# Patient Record
Sex: Male | Born: 1960 | Race: Black or African American | Hispanic: No | Smoking: Former smoker
Health system: Southern US, Community
[De-identification: ages and names within clinical notes are randomized; demographics above are authoritative.]

## PROBLEM LIST (undated history)

## (undated) DIAGNOSIS — M7541 Impingement syndrome of right shoulder: Secondary | ICD-10-CM

## (undated) DIAGNOSIS — J189 Pneumonia, unspecified organism: Secondary | ICD-10-CM

## (undated) DIAGNOSIS — M67929 Unspecified disorder of synovium and tendon, unspecified upper arm: Secondary | ICD-10-CM

## (undated) DIAGNOSIS — J302 Other seasonal allergic rhinitis: Secondary | ICD-10-CM

## (undated) DIAGNOSIS — J42 Unspecified chronic bronchitis: Secondary | ICD-10-CM

## (undated) DIAGNOSIS — G4730 Sleep apnea, unspecified: Secondary | ICD-10-CM

## (undated) DIAGNOSIS — M75101 Unspecified rotator cuff tear or rupture of right shoulder, not specified as traumatic: Secondary | ICD-10-CM

## (undated) DIAGNOSIS — K219 Gastro-esophageal reflux disease without esophagitis: Secondary | ICD-10-CM

## (undated) DIAGNOSIS — M1990 Unspecified osteoarthritis, unspecified site: Secondary | ICD-10-CM

## (undated) DIAGNOSIS — M19019 Primary osteoarthritis, unspecified shoulder: Secondary | ICD-10-CM

## (undated) DIAGNOSIS — F329 Major depressive disorder, single episode, unspecified: Secondary | ICD-10-CM

## (undated) DIAGNOSIS — M069 Rheumatoid arthritis, unspecified: Secondary | ICD-10-CM

## (undated) DIAGNOSIS — F419 Anxiety disorder, unspecified: Secondary | ICD-10-CM

## (undated) DIAGNOSIS — IMO0002 Reserved for concepts with insufficient information to code with codable children: Secondary | ICD-10-CM

## (undated) DIAGNOSIS — F32A Depression, unspecified: Secondary | ICD-10-CM

## (undated) DIAGNOSIS — E785 Hyperlipidemia, unspecified: Secondary | ICD-10-CM

## (undated) DIAGNOSIS — I10 Essential (primary) hypertension: Secondary | ICD-10-CM

## (undated) DIAGNOSIS — I509 Heart failure, unspecified: Secondary | ICD-10-CM

## (undated) HISTORY — DX: Rheumatoid arthritis, unspecified: M06.9

## (undated) HISTORY — PX: CATARACT EXTRACTION: SUR2

## (undated) HISTORY — PX: TONSILLECTOMY: SUR1361

## (undated) HISTORY — PX: EXCISIONAL HEMORRHOIDECTOMY: SHX1541

## (undated) HISTORY — DX: Heart failure, unspecified: I50.9

## (undated) HISTORY — PX: INCISION AND DRAINAGE OF WOUND: SHX1803

---

## 1996-09-24 HISTORY — PX: BUNIONECTOMY: SHX129

## 2006-09-24 HISTORY — PX: SHOULDER ARTHROSCOPY: SHX128

## 2010-09-24 DIAGNOSIS — J189 Pneumonia, unspecified organism: Secondary | ICD-10-CM

## 2010-09-24 HISTORY — DX: Pneumonia, unspecified organism: J18.9

## 2011-08-25 HISTORY — PX: ANTERIOR CERVICAL DECOMP/DISCECTOMY FUSION: SHX1161

## 2011-09-25 HISTORY — PX: SHOULDER ARTHROSCOPY W/ ROTATOR CUFF REPAIR: SHX2400

## 2012-03-11 ENCOUNTER — Encounter (HOSPITAL_BASED_OUTPATIENT_CLINIC_OR_DEPARTMENT_OTHER): Payer: Self-pay | Admitting: *Deleted

## 2012-03-11 NOTE — Progress Notes (Signed)
Called pcp for ekg-notes Bring all meds,overnight bag in case he needs to stay Will need istat

## 2012-03-14 ENCOUNTER — Encounter (HOSPITAL_BASED_OUTPATIENT_CLINIC_OR_DEPARTMENT_OTHER)
Admission: RE | Disposition: A | Discharge status: Home / Self Care | Payer: Self-pay | Source: Ambulatory Visit | Attending: Orthopedic Surgery

## 2012-03-14 ENCOUNTER — Ambulatory Visit (HOSPITAL_BASED_OUTPATIENT_CLINIC_OR_DEPARTMENT_OTHER): Payer: BC Managed Care – PPO | Admitting: Anesthesiology

## 2012-03-14 ENCOUNTER — Encounter (HOSPITAL_BASED_OUTPATIENT_CLINIC_OR_DEPARTMENT_OTHER): Payer: Self-pay | Admitting: Anesthesiology

## 2012-03-14 ENCOUNTER — Ambulatory Visit (HOSPITAL_BASED_OUTPATIENT_CLINIC_OR_DEPARTMENT_OTHER)
Admission: RE | Admit: 2012-03-14 | Discharge: 2012-03-14 | Disposition: A | Discharge status: Home / Self Care | Payer: BC Managed Care – PPO | Source: Ambulatory Visit | Attending: Orthopedic Surgery | Admitting: Orthopedic Surgery

## 2012-03-14 ENCOUNTER — Encounter (HOSPITAL_BASED_OUTPATIENT_CLINIC_OR_DEPARTMENT_OTHER): Payer: Self-pay | Admitting: *Deleted

## 2012-03-14 DIAGNOSIS — K219 Gastro-esophageal reflux disease without esophagitis: Secondary | ICD-10-CM | POA: Insufficient documentation

## 2012-03-14 DIAGNOSIS — M7541 Impingement syndrome of right shoulder: Secondary | ICD-10-CM | POA: Diagnosis present

## 2012-03-14 DIAGNOSIS — M75101 Unspecified rotator cuff tear or rupture of right shoulder, not specified as traumatic: Secondary | ICD-10-CM | POA: Diagnosis present

## 2012-03-14 DIAGNOSIS — M19019 Primary osteoarthritis, unspecified shoulder: Secondary | ICD-10-CM | POA: Diagnosis present

## 2012-03-14 DIAGNOSIS — M75120 Complete rotator cuff tear or rupture of unspecified shoulder, not specified as traumatic: Secondary | ICD-10-CM | POA: Insufficient documentation

## 2012-03-14 DIAGNOSIS — IMO0002 Reserved for concepts with insufficient information to code with codable children: Secondary | ICD-10-CM | POA: Insufficient documentation

## 2012-03-14 DIAGNOSIS — M24119 Other articular cartilage disorders, unspecified shoulder: Secondary | ICD-10-CM | POA: Insufficient documentation

## 2012-03-14 DIAGNOSIS — Z5333 Arthroscopic surgical procedure converted to open procedure: Secondary | ICD-10-CM | POA: Insufficient documentation

## 2012-03-14 DIAGNOSIS — I10 Essential (primary) hypertension: Secondary | ICD-10-CM | POA: Insufficient documentation

## 2012-03-14 DIAGNOSIS — M67929 Unspecified disorder of synovium and tendon, unspecified upper arm: Secondary | ICD-10-CM | POA: Diagnosis present

## 2012-03-14 DIAGNOSIS — M25819 Other specified joint disorders, unspecified shoulder: Secondary | ICD-10-CM | POA: Insufficient documentation

## 2012-03-14 DIAGNOSIS — E785 Hyperlipidemia, unspecified: Secondary | ICD-10-CM | POA: Insufficient documentation

## 2012-03-14 HISTORY — DX: Unspecified osteoarthritis, unspecified site: M19.90

## 2012-03-14 HISTORY — DX: Unspecified disorder of synovium and tendon, unspecified upper arm: M67.929

## 2012-03-14 HISTORY — DX: Primary osteoarthritis, unspecified shoulder: M19.019

## 2012-03-14 HISTORY — DX: Impingement syndrome of right shoulder: M75.41

## 2012-03-14 HISTORY — DX: Unspecified rotator cuff tear or rupture of right shoulder, not specified as traumatic: M75.101

## 2012-03-14 HISTORY — DX: Gastro-esophageal reflux disease without esophagitis: K21.9

## 2012-03-14 HISTORY — DX: Essential (primary) hypertension: I10

## 2012-03-14 HISTORY — DX: Reserved for concepts with insufficient information to code with codable children: IMO0002

## 2012-03-14 HISTORY — DX: Other seasonal allergic rhinitis: J30.2

## 2012-03-14 HISTORY — DX: Hyperlipidemia, unspecified: E78.5

## 2012-03-14 LAB — POCT I-STAT, CHEM 8
Chloride: 102 mEq/L (ref 96–112)
Glucose, Bld: 99 mg/dL (ref 70–99)
HCT: 43 % (ref 39.0–52.0)
Hemoglobin: 14.6 g/dL (ref 13.0–17.0)
Potassium: 3.7 mEq/L (ref 3.5–5.1)

## 2012-03-14 SURGERY — SHOULDER ARTHROSCOPY WITH BICEPS TENDON REPAIR
Anesthesia: General | Site: Shoulder | Laterality: Right | Wound class: Clean

## 2012-03-14 MED ORDER — FENTANYL CITRATE 0.05 MG/ML IJ SOLN
50.0000 ug | INTRAMUSCULAR | Status: DC | PRN
  Administered 2012-03-14: 100 ug via INTRAVENOUS

## 2012-03-14 MED ORDER — DEXAMETHASONE SODIUM PHOSPHATE 4 MG/ML IJ SOLN
INTRAMUSCULAR | Status: DC | PRN
  Administered 2012-03-14: 10 mg via INTRAVENOUS

## 2012-03-14 MED ORDER — PROMETHAZINE HCL 25 MG PO TABS: 25.0000 mg | ORAL_TABLET | Freq: Four times a day (QID) | ORAL | Status: DC | PRN

## 2012-03-14 MED ORDER — LIDOCAINE HCL (CARDIAC) 20 MG/ML IV SOLN
INTRAVENOUS | Status: DC | PRN
  Administered 2012-03-14: 50 mg via INTRAVENOUS

## 2012-03-14 MED ORDER — SODIUM CHLORIDE 0.9 % IR SOLN
Status: DC | PRN
  Administered 2012-03-14: 6

## 2012-03-14 MED ORDER — PROPOFOL 10 MG/ML IV EMUL
INTRAVENOUS | Status: DC | PRN
  Administered 2012-03-14: 200 mg via INTRAVENOUS
  Administered 2012-03-14: 100 mg via INTRAVENOUS

## 2012-03-14 MED ORDER — MIDAZOLAM HCL 2 MG/2ML IJ SOLN
0.5000 mg | INTRAMUSCULAR | Status: DC | PRN
  Administered 2012-03-14: 2 mg via INTRAVENOUS

## 2012-03-14 MED ORDER — BUPIVACAINE-EPINEPHRINE PF 0.5-1:200000 % IJ SOLN
INTRAMUSCULAR | Status: DC | PRN
  Administered 2012-03-14: 30 mL

## 2012-03-14 MED ORDER — PHENYLEPHRINE HCL 10 MG/ML IJ SOLN
10.0000 mg | INTRAVENOUS | Status: DC | PRN
  Administered 2012-03-14: 50 ug/min via INTRAVENOUS

## 2012-03-14 MED ORDER — SUCCINYLCHOLINE CHLORIDE 20 MG/ML IJ SOLN
INTRAMUSCULAR | Status: DC | PRN
  Administered 2012-03-14: 100 mg via INTRAVENOUS

## 2012-03-14 MED ORDER — METHOCARBAMOL 500 MG PO TABS: 500.0000 mg | ORAL_TABLET | Freq: Four times a day (QID) | ORAL | Status: AC

## 2012-03-14 MED ORDER — FENTANYL CITRATE 0.05 MG/ML IJ SOLN
INTRAMUSCULAR | Status: DC | PRN
  Administered 2012-03-14 (×4): 25 ug via INTRAVENOUS

## 2012-03-14 MED ORDER — OXYCODONE-ACETAMINOPHEN 10-325 MG PO TABS: 1.0000 | ORAL_TABLET | Freq: Four times a day (QID) | ORAL | Status: AC | PRN

## 2012-03-14 MED ORDER — LACTATED RINGERS IV SOLN
INTRAVENOUS | Status: DC
  Administered 2012-03-14: 07:00:00 via INTRAVENOUS

## 2012-03-14 MED ORDER — CEFAZOLIN SODIUM-DEXTROSE 2-3 GM-% IV SOLR
2.0000 g | INTRAVENOUS | Status: AC
  Administered 2012-03-14: 2 g via INTRAVENOUS

## 2012-03-14 MED ORDER — ONDANSETRON HCL 4 MG/2ML IJ SOLN
INTRAMUSCULAR | Status: DC | PRN
  Administered 2012-03-14: 4 mg via INTRAVENOUS

## 2012-03-14 MED ORDER — PHENYLEPHRINE HCL 10 MG/ML IJ SOLN
INTRAMUSCULAR | Status: DC | PRN
  Administered 2012-03-14: 20 ug via INTRAVENOUS

## 2012-03-14 SURGICAL SUPPLY — 70 items
ANCHOR SUT BIO SW 4.75 W/FIB (Anchor) ×4 IMPLANT
ANCHOR SUT SWIVELLOK BIO (Anchor) ×2 IMPLANT
BLADE 4.2CUDA (BLADE) IMPLANT
BLADE CUDA GRT WHITE 3.5 (BLADE) IMPLANT
BLADE CUTTER GATOR 3.5 (BLADE) ×2 IMPLANT
BLADE GREAT WHITE 4.2 (BLADE) IMPLANT
BLADE SURG 15 STRL LF DISP TIS (BLADE) ×1 IMPLANT
BLADE SURG 15 STRL SS (BLADE) ×1
BUR OVAL 4.0 (BURR) IMPLANT
BUR OVAL 6.0 (BURR) ×2 IMPLANT
CANISTER OMNI JUG 16 LITER (MISCELLANEOUS) ×2 IMPLANT
CANNULA 5.75X71 LONG (CANNULA) ×2 IMPLANT
CANNULA TWIST IN 8.25X7CM (CANNULA) ×4 IMPLANT
CANNULA TWIST IN 8.25X9CM (CANNULA) IMPLANT
CLOTH BEACON ORANGE TIMEOUT ST (SAFETY) ×2 IMPLANT
CUTTER MENISCUS  4.2MM (BLADE) ×2
CUTTER MENISCUS 4.2MM (BLADE) ×2 IMPLANT
DECANTER SPIKE VIAL GLASS SM (MISCELLANEOUS) IMPLANT
DRAPE INCISE IOBAN 66X45 STRL (DRAPES) ×2 IMPLANT
DRAPE SHOULDER BEACH CHAIR (DRAPES) ×2 IMPLANT
DRAPE U 20/CS (DRAPES) ×2 IMPLANT
DRAPE U-SHAPE 47X51 STRL (DRAPES) ×2 IMPLANT
DRSG PAD ABDOMINAL 8X10 ST (GAUZE/BANDAGES/DRESSINGS) ×2 IMPLANT
DURAPREP 26ML APPLICATOR (WOUND CARE) ×2 IMPLANT
ELECT REM PT RETURN 9FT ADLT (ELECTROSURGICAL) ×2
ELECTRODE REM PT RTRN 9FT ADLT (ELECTROSURGICAL) ×1 IMPLANT
FIBERSTICK 2 (SUTURE) IMPLANT
GLOVE BIO SURGEON STRL SZ8 (GLOVE) ×2 IMPLANT
GLOVE BIOGEL M STRL SZ7.5 (GLOVE) ×2 IMPLANT
GOWN PREVENTION PLUS XLARGE (GOWN DISPOSABLE) ×2 IMPLANT
GOWN PREVENTION PLUS XXLARGE (GOWN DISPOSABLE) ×4 IMPLANT
IMMOBILIZER SHOULDER XLGE (ORTHOPEDIC SUPPLIES) IMPLANT
IV NS IRRIG 3000ML ARTHROMATIC (IV SOLUTION) IMPLANT
KIT BIO-TENODESIS 3X8 DISP (MISCELLANEOUS) ×1
KIT INSRT BABSR STRL DISP BTN (MISCELLANEOUS) ×1 IMPLANT
KIT SHOULDER TRACTION (DRAPES) ×2 IMPLANT
LASSO SUT 90 DEGREE (SUTURE) IMPLANT
NDL SUT 6 .5 CRC .975X.05 MAYO (NEEDLE) IMPLANT
NEEDLE MAYO TAPER (NEEDLE)
PACK ARTHROSCOPY DSU (CUSTOM PROCEDURE TRAY) ×2 IMPLANT
PACK BASIN DAY SURGERY FS (CUSTOM PROCEDURE TRAY) ×2 IMPLANT
SET ARTHROSCOPY TUBING (MISCELLANEOUS) ×1
SET ARTHROSCOPY TUBING LN (MISCELLANEOUS) ×1 IMPLANT
SHEET MEDIUM DRAPE 40X70 STRL (DRAPES) ×2 IMPLANT
SLEEVE SCD COMPRESS KNEE MED (MISCELLANEOUS) ×2 IMPLANT
SLING ARM FOAM STRAP LRG (SOFTGOODS) IMPLANT
SLING ARM FOAM STRAP MED (SOFTGOODS) IMPLANT
SLING ARM FOAM STRAP XLG (SOFTGOODS) ×2 IMPLANT
SLING ARM IMMOBILIZER LRG (SOFTGOODS) IMPLANT
SLING ARM IMMOBILIZER MED (SOFTGOODS) IMPLANT
SPONGE GAUZE 4X4 12PLY (GAUZE/BANDAGES/DRESSINGS) ×2 IMPLANT
STRIP CLOSURE SKIN 1/2X4 (GAUZE/BANDAGES/DRESSINGS) ×2 IMPLANT
SUT FIBERWIRE #2 38 T-5 BLUE (SUTURE)
SUT LASSO 45 DEGREE (SUTURE) IMPLANT
SUT LASSO 45 DEGREE LEFT (SUTURE) IMPLANT
SUT LASSO 45D RIGHT (SUTURE) IMPLANT
SUT MNCRL AB 4-0 PS2 18 (SUTURE) ×2 IMPLANT
SUT PDS AB 1 CT  36 (SUTURE)
SUT PDS AB 1 CT 36 (SUTURE) IMPLANT
SUT PROLENE 0 CT 1 CR/8 (SUTURE) IMPLANT
SUT TIGER TAPE 7 IN WHITE (SUTURE) ×2 IMPLANT
SUT VIC AB 3-0 SH 18 (SUTURE) ×2 IMPLANT
SUT VIC AB 3-0 SH 27 (SUTURE)
SUT VIC AB 3-0 SH 27X BRD (SUTURE) IMPLANT
SUTURE FIBERWR #2 38 T-5 BLUE (SUTURE) IMPLANT
TOWEL OR 17X24 6PK STRL BLUE (TOWEL DISPOSABLE) ×2 IMPLANT
TOWEL OR NON WOVEN STRL DISP B (DISPOSABLE) ×2 IMPLANT
TUBE CONNECTING 20X1/4 (TUBING) ×2 IMPLANT
WAND STAR VAC 90 (SURGICAL WAND) ×2 IMPLANT
WATER STERILE IRR 1000ML POUR (IV SOLUTION) ×2 IMPLANT

## 2012-03-14 NOTE — Op Note (Signed)
03/14/2012  10:35 AM  PATIENT:  Dylan Dudley    PRE-OPERATIVE DIAGNOSIS:  Right shoulder massive full-thickness rotator cuff tear, impingement syndrome, labral tear, a.c. joint degenerative arthritis  POST-OPERATIVE DIAGNOSIS:  Same  PROCEDURE:  Right shoulder arthroscopy with massive rotator cuff repair, acromioplasty, distal clavicle resection, extensive debridement, with open suprapectoral biceps tenodesis.  SURGEON:  Eulas Post, MD  PHYSICIAN ASSISTANT: Janace Litten, OPA-C, present and scrubbed throughout the case, critical for completion in a timely fashion, and for retraction, instrumentation, and closure.  ANESTHESIA:   General  PREOPERATIVE INDICATIONS:  [name] is a  51 y.o. male who had severe shoulder pain and failed conservative treatment. He had the above-named diagnoses based on MRI. He elected for surgical management.  The risks benefits and alternatives were discussed with the patient preoperatively including but not limited to the risks of infection, bleeding, nerve injury, cardiopulmonary complications, the need for revision surgery, among others, and the patient was willing to proceed. We also discussed the risks of recurrent tear, Popeye deformity, stiffness, progression of rotator cuff arthropathy, among others and is willing to proceed.  OPERATIVE IMPLANTS: Arthrex swivel lock size 4.75x2 for the supraspinatus, and upper portion of the infraspinatus with a 6.25 mm swivel lock for the biceps tendon  OPERATIVE FINDINGS: The supraspinatus had extensive tear, with split between the 2 layers of the supraspinatus, superiorly and inferiorly. The infraspinatus is also torn off the bone. The subscapularis was intact. The glenohumeral articular cartilage was intact. The labrum was somewhat frayed, and the biceps tendon had greater than 50% tearing with hourglass type deformity. The tissue quality of the supraspinatus was mediocre. The tissue however was  reasonably mobile. The bone quality was excellent. He had substantial subacromial spurring as well as degenerative disease at the a.c. joint.  OPERATIVE PROCEDURE: The patient is brought to the operating room placed in the supine position. General anesthesia was administered. IV antibiotics were given. A regional block targeting given. The right upper time he was examined and basically had full motion. The extremity was prepped and draped in usual sterile fashion. Time out was performed. Diagnostic arthroscopy was carried out with the above-named findings. I used the arthroscopic biter to release the biceps tendon, and then used the shaver to debride back the tendon edge and the stump. I also shaved the labrum, both anteriorly posteriorly and superiorly.  Once I completed the articular portion of the procedure went to the subacromial space. I performed a complete bursectomy and debridement of the CA ligament, releasing it off of the undersurface of the acromion. The hypertrophic spurring undersurface of the acromion. This was removed with a bur.  I also debrided the tendon back to healthy tendon edges, and then prepared the tuberosity with a bur. I used a bird beak suture passer with a fiber tape an inverted horizontal mattress type configuration from posteriorly for the infraspinatus and posterior portion of the supraspinatus. I then used the scorpion suture passer with the second inverted fiber tape for the anterior portion of supraspinatus. Excellent purchase of the tissue was achieved.  I then placed the cannula superiorly, and then inserted the posterior anchor first, advancing the tendon up over the top of head, and then placed a second anchor anterior to that. Excellent purchase was achieved. There was no dogear configuration. The tendon leg down very smoothly and was well apposed to bone. I brought this out basically to the lateral margin of tuberosity.  I then removed the sutures cut the  fiber  tape, and then performed a distal clavicle resection removing approximately 1.2 cm of distal clavicle. I confirmed this from multiple portals.  I then made small incision over the proximal biceps, and went to the deltopectoral interval, and then delivered the biceps tendon through the wound.  I then sutured the tendon with a #2 FiberWire, and placed a drill hole in the center of the bicipital groove, and then delivered the tendon with the anchor down into the humerus and secured it with the swivel lock. Excellent fixation and soft tissue tension was achieved restoring the contour of the biceps.  The wounds are irrigated copiously and closed with Vicryl followed by Monocryl with Steri-Strips and sterile gauze. He was awakened and returned to the PACU in stable and satisfactory condition. There no complications.

## 2012-03-14 NOTE — Progress Notes (Signed)
Assisted Dr. Fitzgerald with right, ultrasound guided, interscalene  block. Side rails up, monitors on throughout procedure. See vital signs in flow sheet. Tolerated Procedure well. 

## 2012-03-14 NOTE — Discharge Instructions (Signed)
Post Anesthesia Home Care Instructions  Activity: Get plenty of rest for the remainder of the day. A responsible adult should stay with you for 24 hours following the procedure.  For the next 24 hours, DO NOT: -Drive a car -Advertising copywriter -Drink alcoholic beverages -Take any medication unless instructed by your physician -Make any legal decisions or sign important papers.  Meals: Start with liquid foods such as gelatin or soup. Progress to regular foods as tolerated. Avoid greasy, spicy, heavy foods. If nausea and/or vomiting occur, drink only clear liquids until the nausea and/or vomiting subsides. Call your physician if vomiting continues.  Special Instructions/Symptoms: Your throat may feel dry or sore from the anesthesia or the breathing tube placed in your throat during surgery. If this causes discomfort, gargle with warm salt water. The discomfort should disappear within 24 hours.        Regional Anesthesia Blocks  1. Numbness or the inability to move the "blocked" extremity may last from 3-48 hours after placement. The length of time depends on the medication injected and your individual response to the medication. If the numbness is not going away after 48 hours, call your surgeon.  2. The extremity that is blocked will need to be protected until the numbness is gone and the  Strength has returned. Because you cannot feel it, you will need to take extra care to avoid injury. Because it may be weak, you may have difficulty moving it or using it. You may not know what position it is in without looking at it while the block is in effect.  3. For blocks in the legs and feet, returning to weight bearing and walking needs to be done carefully. You will need to wait until the numbness is entirely gone and the strength has returned. You should be able to move your leg and foot normally before you try and bear weight or walk. You will need someone to be with you when you first try  to ensure you do not fall and possibly risk injury.  4. Bruising and tenderness at the needle site are common side effects and will resolve in a few days.  5. Persistent numbness or new problems with movement should be communicated to the surgeon or the [facility] [phone] [facility] [phone]).     Rotator Cuff Injury The rotator cuff is the collective set of muscles and tendons that make up the stabilizing unit of your shoulder. This unit holds in the ball of the humerus (upper arm bone) in the socket of the scapula (shoulder blade). Injuries to this stabilizing unit most commonly come from sports or activities that cause the arm to be moved repeatedly over the head. Examples of this include throwing, weight lifting, swimming, racquet sports, or an injury such as falling on your arm. Chronic (longstanding) irritation of this unit can cause inflammation (soreness), bursitis, and eventual damage to the tendons to the point of rupture (tear). An acute (sudden) injury of the rotator cuff can result in a partial or complete tear. You may need surgery with complete tears. Small or partial rotator cuff tears may be treated conservatively with temporary immobilization, exercises and rest. Physical therapy may be needed. HOME CARE INSTRUCTIONS   Apply ice to the injury for 15 to 20 minutes 3 to 4 times per day for the first 2 days. Put the ice in a plastic bag and place a towel between the bag of ice and your skin.  If you have a shoulder immobilizer (sling and straps), do not remove it for as long as directed by your caregiver or until you see a caregiver for a follow-up examination. If you need to remove it, move your arm as little as possible.   You may want to sleep on several pillows or in a recliner at night to lessen swelling and pain.   Only take over-the-counter or prescription medicines for pain, discomfort, or fever as directed by your caregiver.   Do  simple hand squeezing exercises with a soft rubber ball to decrease hand swelling.  SEEK MEDICAL CARE IF:   Pain in your shoulder increases or new pain or numbness develops in your arm, hand, or fingers.   Your hand or fingers are colder than your other hand.  SEEK IMMEDIATE MEDICAL CARE IF:   Your arm, hand, or fingers are numb or tingling.   Your arm, hand, or fingers are increasingly swollen and painful, or turn white or blue.  Document Released: 09/07/2000 Document Revised: 08/30/2011 Document Reviewed: 08/31/2008 [facility] Patient Information 2012 [place], [place].

## 2012-03-14 NOTE — Anesthesia Procedure Notes (Addendum)
Anesthesia Regional Block:  Interscalene brachial plexus block  Pre-Anesthetic Checklist: ,, timeout performed, Correct Patient, Correct Site, Correct Laterality, Correct Procedure, Correct Position, site marked, Risks and benefits discussed, pre-op evaluation,  At surgeon's request and post-op pain management  Laterality: Right  Prep: Maximum Sterile Barrier Precautions used and chloraprep       Needles:  Injection technique: Single-shot  Needle Type: Echogenic Stimulator Needle      Needle Gauge: 22 and 22 G    Additional Needles:  Procedures: ultrasound guided and nerve stimulator Interscalene brachial plexus block  Nerve Stimulator or Paresthesia:  Response: Biceps response, 0.4 mA,   Additional Responses:   Narrative:  Start time: 03/14/2012 6:56 AM End time: 03/14/2012 7:09 AM Injection made incrementally with aspirations every 5 mL. Anesthesiologist: Sampson Goon, MD  Additional Notes: 2% Lidocaine skin wheel.   Interscalene brachial plexus block Procedure Name: Intubation Performed by: York Grice Pre-anesthesia Checklist: Patient identified, Timeout performed, Emergency Drugs available, Suction available and Patient being monitored Patient Re-evaluated:Patient Re-evaluated prior to inductionOxygen Delivery Method: Circle system utilized Preoxygenation: Pre-oxygenation with 100% oxygen Intubation Type: IV induction Ventilation: Mask ventilation without difficulty Laryngoscope Size: Miller and 2 Grade View: Grade II Tube type: Oral Tube size: 8.0 mm Number of attempts: 1 Airway Equipment and Method: Stylet Placement Confirmation: ETT inserted through vocal cords under direct vision,  breath sounds checked- equal and bilateral and positive ETCO2 Secured at: 24 cm Tube secured with: Tape Dental Injury: Teeth and Oropharynx as per pre-operative assessment

## 2012-03-14 NOTE — Anesthesia Preprocedure Evaluation (Signed)
Anesthesia Evaluation  Patient identified by MRN, date of birth, ID band Patient awake    Reviewed: Allergy & Precautions, H&P , NPO status , Patient's Chart, lab work & pertinent test results  Airway Mallampati: I TM Distance: >3 FB Neck ROM: Full    Dental No notable dental hx. (+) Teeth Intact and Dental Advisory Given   Pulmonary neg pulmonary ROS,  breath sounds clear to auscultation  Pulmonary exam normal       Cardiovascular hypertension, On Medications Rhythm:Regular Rate:Normal     Neuro/Psych negative neurological ROS  negative psych ROS   GI/Hepatic Neg liver ROS, GERD-  Medicated and Controlled,  Endo/Other  negative endocrine ROS  Renal/GU negative Renal ROS  negative genitourinary   Musculoskeletal   Abdominal   Peds  Hematology negative hematology ROS (+)   Anesthesia Other Findings   Reproductive/Obstetrics negative OB ROS                           Anesthesia Physical Anesthesia Plan  ASA: II  Anesthesia Plan: General   Post-op Pain Management:    Induction: Intravenous  Airway Management Planned: Oral ETT  Additional Equipment:   Intra-op Plan:   Post-operative Plan: Extubation in OR  Informed Consent: I have reviewed the patients History and Physical, chart, labs and discussed the procedure including the risks, benefits and alternatives for the proposed anesthesia with the patient or authorized representative who has indicated his/her understanding and acceptance.   Dental advisory given  Plan Discussed with: CRNA  Anesthesia Plan Comments:         Anesthesia Quick Evaluation

## 2012-03-14 NOTE — H&P (Signed)
PREOPERATIVE H&P  Chief Complaint: right shoulder impingment syndrome, shoulder, impingment syndrome- shoulder complete rupture of rotator cuff, bicipital tenosynovitis   HPI: Dylan Dudley is a 51 y.o. male who presents for preoperative history and physical with a diagnosis of right shoulder impingment syndrome, shoulder, impingment syndrome- shoulder complete rupture of rotator cuff, bicipital tenosynovitis . Symptoms are rated as moderate to severe, and have been worsening.  This is significantly impairing activities of daily living.  He has elected for surgical management.   Past Medical History  Diagnosis Date  . Hypertension   . Hyperlipemia   . Seasonal allergies   . GERD (gastroesophageal reflux disease)   . Arthritis   . DDD (degenerative disc disease)    Past Surgical History  Procedure Date  . Cervical fusion 08/2011    [place]  . Shoulder arthroscopy 2008    left  . Bunionectomy 1998    both feet  . Tonsillectomy    History   Social History  . Marital Status: Married    Spouse Name: N/A    Number of Children: N/A  . Years of Education: N/A   Social History Main Topics  . Smoking status: Current Everyday Smoker -- 0.5 packs/day  . Smokeless tobacco: None  . Alcohol Use: Yes     2 beers daily  . Drug Use: No  . Sexually Active:    Other Topics Concern  . None   Social History Narrative  . None   History reviewed. No pertinent family history. No Known Allergies Prior to Admission medications   Medication Sig Start Date End Date Taking? Authorizing Provider  amLODipine-benazepril (LOTREL) 5-10 MG per capsule Take 1 capsule by mouth daily.   Yes Historical Provider, MD  aspirin 81 MG tablet Take 81 mg by mouth daily.   Yes Historical Provider, MD  atorvastatin (LIPITOR) 40 MG tablet Take 40 mg by mouth daily.   Yes Historical Provider, MD  hydrochlorothiazide (HYDRODIURIL) 25 MG tablet Take 25 mg by mouth daily. Takes 1/2   Yes Historical  Provider, MD  HYDROcodone-acetaminophen (NORCO) 10-325 MG per tablet Take 1 tablet by mouth every 6 (six) hours as needed.   Yes Historical Provider, MD  loratadine (CLARITIN) 10 MG tablet Take 10 mg by mouth as needed.   Yes Historical Provider, MD  omeprazole (PRILOSEC) 20 MG capsule Take 20 mg by mouth daily.   Yes Historical Provider, MD     Positive ROS: All other systems have been reviewed and were otherwise negative with the exception of those mentioned in the HPI and as above.  Physical Exam: General: Alert, no acute distress Cardiovascular: No pedal edema Respiratory: No cyanosis, no use of accessory musculature GI: No organomegaly, abdomen is soft and non-tender Skin: No lesions in the area of chief complaint Neurologic: Sensation intact distally Psychiatric: Patient is competent for consent with normal mood and affect Lymphatic: No axillary or cervical lymphadenopathy  MUSCULOSKELETAL: Severe pain with abduction and overhead motion of the right shoulder, positive pain over the a.c. joint and over the biceps.  Assessment: right shoulder impingment syndrome, shoulder, impingment syndrome- shoulder complete rupture of rotator cuff, bicipital tenosynovitis   Plan: Plan for Procedure(s): SHOULDER ARTHROSCOPY WITH ROTATOR CUFF REPAIR, ACROMIOPLASTY, DISTAL CLAVICLE RESECTION, BICEPS TENODESIS.  The risks benefits and alternatives were discussed with the patient including but not limited to the risks of nonoperative treatment, versus surgical intervention including infection, bleeding, nerve injury,  blood clots, cardiopulmonary complications, morbidity, mortality, among others, and they were  willing to proceed. We've also discussed the risk of recurrent rotator cuff tear, ongoing pain, stiffness, loss of function, among others.  Eulas Post, MD 03/14/2012 7:28 AM

## 2012-03-14 NOTE — Anesthesia Postprocedure Evaluation (Signed)
  Anesthesia Post-op Note  Patient: Dylan Dudley A Prajapati  Procedure(s) Performed: Procedure(s) (LRB): SHOULDER ARTHROSCOPY WITH BICEPS TENDON REPAIR (Right)  Patient Location: PACU  Anesthesia Type: GA combined with regional for post-op pain  Level of Consciousness: awake  Airway and Oxygen Therapy: Patient Spontanous Breathing and Patient connected to face mask oxygen  Post-op Pain: none  Post-op Assessment: Post-op Vital signs reviewed, Patient's Cardiovascular Status Stable, Respiratory Function Stable, Patent Airway and No signs of Nausea or vomiting  Post-op Vital Signs: Reviewed and stable  Complications: No apparent anesthesia complications

## 2012-03-14 NOTE — Transfer of Care (Signed)
Immediate Anesthesia Transfer of Care Note  Patient: Dylan Dudley  Procedure(s) Performed: Procedure(s) (LRB): SHOULDER ARTHROSCOPY WITH BICEPS TENDON REPAIR (Right)  Patient Location: PACU  Anesthesia Type: General  Level of Consciousness: awake and alert   Airway & Oxygen Therapy: Patient Spontanous Breathing and Patient connected to face mask oxygen  Post-op Assessment: Report given to PACU RN and Post -op Vital signs reviewed and stable  Post vital signs: Reviewed and stable  Complications: No apparent anesthesia complications

## 2012-03-20 ENCOUNTER — Encounter (HOSPITAL_BASED_OUTPATIENT_CLINIC_OR_DEPARTMENT_OTHER): Payer: Self-pay

## 2012-09-24 HISTORY — PX: NECK SURGERY: SHX720

## 2012-12-23 HISTORY — PX: DEBRIDEMENT AND CLOSURE WOUND: SHX5614

## 2013-07-14 NOTE — H&P (Signed)
Subjective:   Patient ID: Dylan Dudley is a 52 y.o. male.  HPI  Pt has hx ACDF 2012 and posterior approach to remove bone spur. Posterior incision complicated by opening, wound healing problems with exposure spinous processes. Underwent excisional debridement and closure with approximation trapezius over bones. Cultures of bone with 1+ Staph, completed few weeks of Vancomycin. Had healed as of 03/2013 visit but suffered recurrent open wound following this. Area opened and packed last visit, cultures with S Aureus. Completed Bactrim course off of this for over 2 weeks. No fevers or chills. PCP obtained CT scan since last visit that he brings with him today.  Review of Systems  Constitutional: Negative for fever, chills, diaphoresis, appetite change and fatigue.  Respiratory: Negative.  Cardiovascular: Negative.  Skin: Positive for wound.  Past Medical History   Diagnosis  Date   .  Hypertension    .  Hypercholesterolemia    .  Anxiety    .  Depression     Past Surgical History   Procedure  Laterality  Date   .  Rotator cuff repair       both arms   .  Neck surgery   2012   .  Foot surgery   1998   .  C-spine fusion anterior approach   2012   .  Head / neck debridement  N/A  01/06/2013     Procedure: EXCISION OF NECK & SPINE WOUND; Surgeon: Gaston Islam, MD; Location[facility] OUTPAT[facility]; Service: Plastics; Laterality: N/A; CONTACT PRECAUTIONS  LATEX PRECAUTIONS  EXCISION OF NECK, SPINE WOUND, POSS FLAP, POSS. INTEGRA, POSS STSG, POSS. VAC/120 MIN PER DR MOLNAR   .  Flap closure back wound  N/A  01/06/2013     Procedure: FLAP CLOSURE BACK WOUND; Surgeon: Gaston Islam, MD; Location[facility]; Service: Plastics; Laterality: N/A;   .  Vac placement  N/A  01/06/2013     Procedure: VAC PLACEMENT; Surgeon: Gaston Islam, MD; Location: [facility] OUTPATIENT OR; Service: Plastics; Laterality: N/A;    History    Social History   .  Marital Status:  Unknown     Spouse  Name:  N/A     Number of Children:  N/A   .  Years of Education:  N/A    Occupational History   .  Not on file.    Social History Main Topics   .  Smoking status:  Current Every Day Smoker -- 0.30 packs/day for 35 years     Types:  Cigarettes   .  Smokeless tobacco:  Never Used   .  Alcohol Use:  72.0 oz/week     6 Cans of beer (12 oz./can) per week      Comment: social drinker   .  Drug Use:  No   .  Sexual Activity:  Not on file    Other Topics  Concern   .  Not on file    Social History Narrative   .  No narrative on file    Current Outpatient Prescriptions on File Prior to Visit   Medication  Sig  Dispense  Refill   .  amLODIPine-benazepril (LOTREL) 5-10 mg per capsule  Take 1 capsule by mouth daily.     Marland Kitchen  aspirin 81 MG EC tablet  Take 81 mg by mouth daily.     Marland Kitchen  atorvastatin (LIPITOR) 20 MG tablet  Take 20 mg by mouth daily.     .  cholecalciferol (  VITAMIN D3) 1000 UNIT Tab  Take 2,000 Units by mouth daily.     .  citalopram (CELEXA) 40 MG tablet  Take 40 mg by mouth nightly.     .  diphenhydrAMINE (BENADRYL) 25 mg capsule  Take 25 mg by mouth every 6 (six) hours as needed.     .  hydrochlorothiazide (HYDRODIURIL) 12.5 MG tablet  Take 12.5 mg by mouth daily.     Marland Kitchen  HYDROmorphone (DILAUDID) 4 MG tablet  Take 1 tablet (4 mg total) by mouth every 4 (four) hours as needed.  50 tablet  0   .  ibuprofen (ADVIL,MOTRIN) 800 MG tablet  Take 800 mg by mouth every 6 (six) hours as needed for Pain.     Marland Kitchen  loratadine (CLARITIN) 10 mg tablet  Take 10 mg by mouth daily.     .  naproxen (NAPROSYN) 500 MG tablet  Take 500 mg by mouth 2 (two) times daily as needed.     Marland Kitchen  omeprazole (PRILOSEC) 20 MG capsule  Take 20 mg by mouth daily.     .  propranolol (INDERAL) 20 MG tablet  Take 20 mg by mouth 2 (two) times daily as needed.     .  traZODone (DESYREL) 50 MG tablet  Take 50 mg by mouth nightly.      No current facility-administered medications on file prior to visit.    Objective:    Physical Exam  Constitutional: He is oriented to person, place, and time.  Neck:  Posterior neck with attenuated skin of midline scar, opening of appr 5 mm that tracks to bone with depth 2 cm, able to express blood fat and thick yellow fluid, no cellulitis  Cardiovascular: Normal rate and regular rhythm.  Pulmonary/Chest: Effort normal and breath sounds normal.  Neurological: He is alert and oriented to person, place, and time.  Assessment:   Recurrent wound, infection and bone exposure over back, spine  Plan:   Personally reviewed outside CT scan. Fluid collection noted over C7 spinous process appr 2 cm diameter, trapezius muscle not approximated in this area.  Clinically without systemic signs infection. Plan excisional debridement inc bone. May require ID evaluation if bone cultures following debridement are positive. Discussed given hx chronic wound exposure and prior surgeries, paraspinal muscles likely not usable, will readvance trapezius. Pending pain, OR findings, will plan outpatient surgery, closure over drain. Rec he establish with his local ID provider that managed his antibiotics earlier this year.

## 2013-07-15 ENCOUNTER — Other Ambulatory Visit (HOSPITAL_COMMUNITY): Payer: Self-pay | Admitting: *Deleted

## 2013-07-15 ENCOUNTER — Encounter (HOSPITAL_COMMUNITY): Payer: Self-pay | Admitting: Pharmacy Technician

## 2013-07-15 NOTE — Pre-Procedure Instructions (Addendum)
Tiwan Schnitker Worrel  07/15/2013   Your procedure is scheduled on:  Friday, July 17, 2013 at 7:30 AM.   Report to [facility] Entrance "A" at 5:30 AM.   Call this number if you have problems the morning of surgery: [phone]   Remember:   Do not eat food or drink liquids after midnight tonight, 07/16/13.   Take these medicines the morning of surgery with A SIP OF WATER: amLODipine (NORVASC), buPROPion (WELLBUTRIN SR), citalopram (CELEXA), gabapentin (NEURONTIN), omeprazole (PRILOSEC), propranolol (INDERAL)                 Do not wear jewelry.  Do not wear lotions, powders, or cologne. You may wear deodorant.  Do not shave 48 hours prior to surgery. Men may shave face and neck.  Do not bring valuables to the hospital.  [facility] is not responsible                  for any belongings or valuables.               Contacts, dentures or bridgework may not be worn into surgery.  Leave suitcase in the car. After surgery it may be brought to your room.  For patients admitted to the hospital, discharge time is determined by your                treatment team.               Patients discharged the day of surgery will not be allowed to drive  home.  Name and phone number of your driver: Friend/Family   Special Instructions: Shower using CHG 2 nights before surgery and the night before surgery.  If you shower the day of surgery use CHG.  Use special wash - you have one bottle of CHG for all showers.  You should use approximately 1/3 of the bottle for each shower.   Please read over the following fact sheets that you were given: Pain Booklet, Coughing and Deep Breathing and Surgical Site Infection Prevention

## 2013-07-16 ENCOUNTER — Encounter (HOSPITAL_COMMUNITY): Payer: Self-pay

## 2013-07-16 ENCOUNTER — Encounter (HOSPITAL_COMMUNITY)
Admission: RE | Admit: 2013-07-16 | Discharge: 2013-07-16 | Disposition: A | Discharge status: Home / Self Care | Payer: BC Managed Care – PPO | Source: Ambulatory Visit | Attending: Plastic Surgery | Admitting: Plastic Surgery

## 2013-07-16 HISTORY — DX: Pneumonia, unspecified organism: J18.9

## 2013-07-16 HISTORY — DX: Depression, unspecified: F32.A

## 2013-07-16 HISTORY — DX: Major depressive disorder, single episode, unspecified: F32.9

## 2013-07-16 HISTORY — DX: Sleep apnea, unspecified: G47.30

## 2013-07-16 LAB — CBC WITH DIFFERENTIAL/PLATELET
Basophils Absolute: 0 10*3/uL (ref 0.0–0.1)
Basophils Relative: 0 % (ref 0–1)
Eosinophils Relative: 3 % (ref 0–5)
HCT: 39.7 % (ref 39.0–52.0)
Hemoglobin: 14 g/dL (ref 13.0–17.0)
Lymphs Abs: 1.1 10*3/uL (ref 0.7–4.0)
MCH: 31.4 pg (ref 26.0–34.0)
MCHC: 35.3 g/dL (ref 30.0–36.0)
MCV: 89 fL (ref 78.0–100.0)
Monocytes Absolute: 0.4 10*3/uL (ref 0.1–1.0)
Monocytes Relative: 7 % (ref 3–12)
Neutro Abs: 3.3 10*3/uL (ref 1.7–7.7)
RDW: 15.8 % — ABNORMAL HIGH (ref 11.5–15.5)

## 2013-07-16 LAB — BASIC METABOLIC PANEL
BUN: 21 mg/dL (ref 6–23)
CO2: 29 mEq/L (ref 19–32)
Chloride: 98 mEq/L (ref 96–112)
Creatinine, Ser: 1.37 mg/dL — ABNORMAL HIGH (ref 0.50–1.35)
GFR calc Af Amer: 67 mL/min — ABNORMAL LOW (ref >90)
Glucose, Bld: 119 mg/dL — ABNORMAL HIGH (ref 70–99)
Potassium: 4.1 mEq/L (ref 3.5–5.1)

## 2013-07-16 LAB — C-REACTIVE PROTEIN: CRP: 4 mg/dL — ABNORMAL HIGH (ref <0.60)

## 2013-07-16 MED ORDER — DEXTROSE 5 % IV SOLN
3.0000 g | INTRAVENOUS | Status: AC
  Administered 2013-07-17: 3 g via INTRAVENOUS
  Filled 2013-07-16: qty 3000

## 2013-07-16 NOTE — Progress Notes (Addendum)
Sleep study req'd from [facility] [address] ,[place] Bake [phone]

## 2013-07-17 ENCOUNTER — Encounter (HOSPITAL_COMMUNITY): Payer: Self-pay | Admitting: *Deleted

## 2013-07-17 ENCOUNTER — Observation Stay (HOSPITAL_COMMUNITY)
Admission: RE | Admit: 2013-07-17 | Discharge: 2013-07-18 | Disposition: A | Discharge status: Home / Self Care | Payer: BC Managed Care – PPO | Source: Ambulatory Visit | Attending: Plastic Surgery | Admitting: Plastic Surgery

## 2013-07-17 ENCOUNTER — Ambulatory Visit (HOSPITAL_COMMUNITY): Payer: BC Managed Care – PPO | Admitting: Anesthesiology

## 2013-07-17 ENCOUNTER — Encounter (HOSPITAL_COMMUNITY): Payer: BC Managed Care – PPO | Admitting: Anesthesiology

## 2013-07-17 ENCOUNTER — Encounter (HOSPITAL_COMMUNITY)
Admission: RE | Disposition: A | Discharge status: Home / Self Care | Payer: Self-pay | Source: Ambulatory Visit | Attending: Plastic Surgery

## 2013-07-17 DIAGNOSIS — Y838 Other surgical procedures as the cause of abnormal reaction of the patient, or of later complication, without mention of misadventure at the time of the procedure: Secondary | ICD-10-CM | POA: Insufficient documentation

## 2013-07-17 DIAGNOSIS — I10 Essential (primary) hypertension: Secondary | ICD-10-CM | POA: Insufficient documentation

## 2013-07-17 DIAGNOSIS — Z01812 Encounter for preprocedural laboratory examination: Secondary | ICD-10-CM | POA: Insufficient documentation

## 2013-07-17 DIAGNOSIS — Z01818 Encounter for other preprocedural examination: Secondary | ICD-10-CM | POA: Insufficient documentation

## 2013-07-17 DIAGNOSIS — Z981 Arthrodesis status: Secondary | ICD-10-CM | POA: Insufficient documentation

## 2013-07-17 DIAGNOSIS — Z23 Encounter for immunization: Secondary | ICD-10-CM | POA: Insufficient documentation

## 2013-07-17 DIAGNOSIS — T8189XA Other complications of procedures, not elsewhere classified, initial encounter: Principal | ICD-10-CM | POA: Insufficient documentation

## 2013-07-17 DIAGNOSIS — F17200 Nicotine dependence, unspecified, uncomplicated: Secondary | ICD-10-CM | POA: Insufficient documentation

## 2013-07-17 DIAGNOSIS — K219 Gastro-esophageal reflux disease without esophagitis: Secondary | ICD-10-CM | POA: Insufficient documentation

## 2013-07-17 DIAGNOSIS — M8668 Other chronic osteomyelitis, other site: Secondary | ICD-10-CM | POA: Insufficient documentation

## 2013-07-17 DIAGNOSIS — Z01810 Encounter for preprocedural cardiovascular examination: Secondary | ICD-10-CM | POA: Insufficient documentation

## 2013-07-17 HISTORY — PX: MUSCLE FLAP CLOSURE: SHX2054

## 2013-07-17 HISTORY — PX: DEBRIDEMENT AND CLOSURE WOUND: SHX5614

## 2013-07-17 HISTORY — DX: Anxiety disorder, unspecified: F41.9

## 2013-07-17 SURGERY — DEBRIDEMENT, WOUND, WITH CLOSURE
Anesthesia: General | Site: Back | Wound class: Dirty or Infected

## 2013-07-17 MED ORDER — PROMETHAZINE HCL 25 MG/ML IJ SOLN: 6.2500 mg | INTRAMUSCULAR | Status: DC | PRN

## 2013-07-17 MED ORDER — GABAPENTIN 600 MG PO TABS
600.0000 mg | ORAL_TABLET | Freq: Three times a day (TID) | ORAL | Status: DC
  Administered 2013-07-17 – 2013-07-18 (×3): 600 mg via ORAL
  Filled 2013-07-17 (×5): qty 1

## 2013-07-17 MED ORDER — PROPOFOL 10 MG/ML IV BOLUS
INTRAVENOUS | Status: DC | PRN
  Administered 2013-07-17: 200 mg via INTRAVENOUS

## 2013-07-17 MED ORDER — HYDROCHLOROTHIAZIDE 12.5 MG PO CAPS
12.5000 mg | ORAL_CAPSULE | Freq: Every day | ORAL | Status: DC
  Administered 2013-07-18: 12.5 mg via ORAL
  Filled 2013-07-17: qty 1

## 2013-07-17 MED ORDER — BUPIVACAINE-EPINEPHRINE 0.25% -1:200000 IJ SOLN
INTRAMUSCULAR | Status: DC | PRN
  Administered 2013-07-17: 15 mL

## 2013-07-17 MED ORDER — OXYCODONE HCL 5 MG/5ML PO SOLN: 5.0000 mg | Freq: Once | ORAL | Status: DC | PRN

## 2013-07-17 MED ORDER — BUPIVACAINE-EPINEPHRINE PF 0.25-1:200000 % IJ SOLN
INTRAMUSCULAR | Status: AC
  Filled 2013-07-17: qty 30

## 2013-07-17 MED ORDER — ATORVASTATIN CALCIUM 20 MG PO TABS
20.0000 mg | ORAL_TABLET | Freq: Every day | ORAL | Status: DC
  Administered 2013-07-17: 20 mg via ORAL
  Filled 2013-07-17 (×2): qty 1

## 2013-07-17 MED ORDER — GLYCOPYRROLATE 0.2 MG/ML IJ SOLN
INTRAMUSCULAR | Status: DC | PRN
  Administered 2013-07-17: .6 mg via INTRAVENOUS

## 2013-07-17 MED ORDER — HYDROMORPHONE HCL PF 1 MG/ML IJ SOLN: 0.2500 mg | INTRAMUSCULAR | Status: DC | PRN

## 2013-07-17 MED ORDER — NEOSTIGMINE METHYLSULFATE 1 MG/ML IJ SOLN
INTRAMUSCULAR | Status: DC | PRN
  Administered 2013-07-17: 4 mg via INTRAVENOUS

## 2013-07-17 MED ORDER — HYDROMORPHONE HCL PF 1 MG/ML IJ SOLN
0.5000 mg | INTRAMUSCULAR | Status: DC | PRN
  Administered 2013-07-17 (×2): 1 mg via INTRAVENOUS
  Filled 2013-07-17 (×2): qty 1

## 2013-07-17 MED ORDER — CEFAZOLIN SODIUM 1-5 GM-% IV SOLN
1.0000 g | Freq: Three times a day (TID) | INTRAVENOUS | Status: DC
  Administered 2013-07-17 – 2013-07-18 (×3): 1 g via INTRAVENOUS
  Filled 2013-07-17 (×4): qty 50

## 2013-07-17 MED ORDER — HYDROCHLOROTHIAZIDE 25 MG PO TABS: 12.5000 mg | ORAL_TABLET | Freq: Every day | ORAL | Status: DC

## 2013-07-17 MED ORDER — OXYCODONE HCL 5 MG PO TABS
5.0000 mg | ORAL_TABLET | ORAL | Status: DC | PRN
  Administered 2013-07-17: 10 mg via ORAL
  Filled 2013-07-17: qty 2

## 2013-07-17 MED ORDER — PNEUMOCOCCAL VAC POLYVALENT 25 MCG/0.5ML IJ INJ
0.5000 mL | INJECTION | INTRAMUSCULAR | Status: AC
  Administered 2013-07-18: 0.5 mL via INTRAMUSCULAR
  Filled 2013-07-17: qty 0.5

## 2013-07-17 MED ORDER — PROPRANOLOL HCL 20 MG PO TABS
20.0000 mg | ORAL_TABLET | Freq: Every day | ORAL | Status: DC
  Administered 2013-07-18: 20 mg via ORAL
  Filled 2013-07-17: qty 1

## 2013-07-17 MED ORDER — FENTANYL CITRATE 0.05 MG/ML IJ SOLN
INTRAMUSCULAR | Status: DC | PRN
  Administered 2013-07-17: 250 ug via INTRAVENOUS

## 2013-07-17 MED ORDER — PANTOPRAZOLE SODIUM 40 MG PO TBEC
40.0000 mg | DELAYED_RELEASE_TABLET | Freq: Every day | ORAL | Status: DC
  Administered 2013-07-18: 40 mg via ORAL
  Filled 2013-07-17: qty 1

## 2013-07-17 MED ORDER — BUPROPION HCL ER (SR) 150 MG PO TB12
150.0000 mg | ORAL_TABLET | Freq: Two times a day (BID) | ORAL | Status: DC
  Administered 2013-07-17 – 2013-07-18 (×2): 150 mg via ORAL
  Filled 2013-07-17 (×3): qty 1

## 2013-07-17 MED ORDER — HYDROMORPHONE HCL PF 1 MG/ML IJ SOLN
1.0000 mg | INTRAMUSCULAR | Status: DC | PRN
  Administered 2013-07-17 – 2013-07-18 (×6): 2 mg via INTRAVENOUS
  Filled 2013-07-17 (×6): qty 2

## 2013-07-17 MED ORDER — MEPERIDINE HCL 25 MG/ML IJ SOLN: 6.2500 mg | INTRAMUSCULAR | Status: DC | PRN

## 2013-07-17 MED ORDER — SENNOSIDES-DOCUSATE SODIUM 8.6-50 MG PO TABS: 1.0000 | ORAL_TABLET | Freq: Every evening | ORAL | Status: DC | PRN

## 2013-07-17 MED ORDER — MIDAZOLAM HCL 2 MG/2ML IJ SOLN: 0.5000 mg | Freq: Once | INTRAMUSCULAR | Status: DC | PRN

## 2013-07-17 MED ORDER — ROCURONIUM BROMIDE 100 MG/10ML IV SOLN
INTRAVENOUS | Status: DC | PRN
  Administered 2013-07-17: 50 mg via INTRAVENOUS

## 2013-07-17 MED ORDER — ONDANSETRON HCL 4 MG/2ML IJ SOLN
INTRAMUSCULAR | Status: DC | PRN
  Administered 2013-07-17: 4 mg via INTRAVENOUS

## 2013-07-17 MED ORDER — ONDANSETRON HCL 4 MG PO TABS: 4.0000 mg | ORAL_TABLET | Freq: Four times a day (QID) | ORAL | Status: DC | PRN

## 2013-07-17 MED ORDER — SODIUM CHLORIDE 0.9 % IR SOLN
Status: DC | PRN
  Administered 2013-07-17: 1000 mL

## 2013-07-17 MED ORDER — CITALOPRAM HYDROBROMIDE 40 MG PO TABS
40.0000 mg | ORAL_TABLET | Freq: Every day | ORAL | Status: DC
  Administered 2013-07-18: 40 mg via ORAL
  Filled 2013-07-17 (×2): qty 1

## 2013-07-17 MED ORDER — KCL IN DEXTROSE-NACL 20-5-0.45 MEQ/L-%-% IV SOLN
INTRAVENOUS | Status: DC
  Administered 2013-07-17 – 2013-07-18 (×2): via INTRAVENOUS
  Filled 2013-07-17 (×3): qty 1000

## 2013-07-17 MED ORDER — OXYCODONE HCL 5 MG PO TABS: 5.0000 mg | ORAL_TABLET | Freq: Once | ORAL | Status: DC | PRN

## 2013-07-17 MED ORDER — AMLODIPINE BESYLATE 5 MG PO TABS
5.0000 mg | ORAL_TABLET | Freq: Every day | ORAL | Status: DC
  Administered 2013-07-18: 5 mg via ORAL
  Filled 2013-07-17: qty 1

## 2013-07-17 MED ORDER — MIDAZOLAM HCL 5 MG/5ML IJ SOLN
INTRAMUSCULAR | Status: DC | PRN
  Administered 2013-07-17: 2 mg via INTRAVENOUS

## 2013-07-17 MED ORDER — LACTATED RINGERS IV SOLN
INTRAVENOUS | Status: DC | PRN
  Administered 2013-07-17 (×2): via INTRAVENOUS

## 2013-07-17 MED ORDER — INFLUENZA VAC SPLIT QUAD 0.5 ML IM SUSP
0.5000 mL | INTRAMUSCULAR | Status: AC
  Administered 2013-07-18: 0.5 mL via INTRAMUSCULAR
  Filled 2013-07-17: qty 0.5

## 2013-07-17 MED ORDER — LIDOCAINE HCL (CARDIAC) 20 MG/ML IV SOLN
INTRAVENOUS | Status: DC | PRN
  Administered 2013-07-17: 40 mg via INTRAVENOUS

## 2013-07-17 MED ORDER — SODIUM CHLORIDE 0.9 % IR SOLN
Status: DC | PRN
  Administered 2013-07-17: 09:00:00

## 2013-07-17 MED ORDER — ONDANSETRON HCL 4 MG/2ML IJ SOLN
4.0000 mg | Freq: Four times a day (QID) | INTRAMUSCULAR | Status: DC | PRN
  Administered 2013-07-17: 4 mg via INTRAVENOUS
  Filled 2013-07-17: qty 2

## 2013-07-17 SURGICAL SUPPLY — 44 items
APPLICATOR COTTON TIP 6IN STRL (MISCELLANEOUS) ×2 IMPLANT
BAG DECANTER FOR FLEXI CONT (MISCELLANEOUS) ×2 IMPLANT
BANDAGE GAUZE ELAST BULKY 4 IN (GAUZE/BANDAGES/DRESSINGS) IMPLANT
BLADE SURG ROTATE 9660 (MISCELLANEOUS) IMPLANT
CANISTER SUCTION 2500CC (MISCELLANEOUS) ×2 IMPLANT
CHLORAPREP W/TINT 26ML (MISCELLANEOUS) IMPLANT
CLOTH BEACON ORANGE TIMEOUT ST (SAFETY) ×2 IMPLANT
CONT SPEC STER OR (MISCELLANEOUS) ×6 IMPLANT
COVER SURGICAL LIGHT HANDLE (MISCELLANEOUS) ×2 IMPLANT
DRAPE INCISE IOBAN 66X45 STRL (DRAPES) IMPLANT
DRAPE PED LAPAROTOMY (DRAPES) ×2 IMPLANT
DRAPE PROXIMA HALF (DRAPES) IMPLANT
DRESSING TELFA 8X3 (GAUZE/BANDAGES/DRESSINGS) ×2 IMPLANT
DRSG ADAPTIC 3X8 NADH LF (GAUZE/BANDAGES/DRESSINGS) IMPLANT
DRSG PAD ABDOMINAL 8X10 ST (GAUZE/BANDAGES/DRESSINGS) ×2 IMPLANT
DRSG VAC ATS LRG SENSATRAC (GAUZE/BANDAGES/DRESSINGS) IMPLANT
DRSG VAC ATS MED SENSATRAC (GAUZE/BANDAGES/DRESSINGS) IMPLANT
DRSG VAC ATS SM SENSATRAC (GAUZE/BANDAGES/DRESSINGS) IMPLANT
ELECT CAUTERY BLADE 6.4 (BLADE) ×2 IMPLANT
ELECT REM PT RETURN 9FT ADLT (ELECTROSURGICAL) ×2
ELECTRODE REM PT RTRN 9FT ADLT (ELECTROSURGICAL) ×1 IMPLANT
GLOVE SURG SS PI 6.0 STRL IVOR (GLOVE) ×2 IMPLANT
GLOVE SURG SS PI 6.5 STRL IVOR (GLOVE) ×2 IMPLANT
GOWN STRL NON-REIN LRG LVL3 (GOWN DISPOSABLE) ×4 IMPLANT
HANDPIECE INTERPULSE COAX TIP (DISPOSABLE)
KIT BASIN OR (CUSTOM PROCEDURE TRAY) ×2 IMPLANT
KIT ROOM TURNOVER OR (KITS) ×2 IMPLANT
NS IRRIG 1000ML POUR BTL (IV SOLUTION) ×2 IMPLANT
PACK GENERAL/GYN (CUSTOM PROCEDURE TRAY) ×2 IMPLANT
PAD ARMBOARD 7.5X6 YLW CONV (MISCELLANEOUS) ×4 IMPLANT
SET HNDPC FAN SPRY TIP SCT (DISPOSABLE) IMPLANT
SPONGE GAUZE 4X4 12PLY (GAUZE/BANDAGES/DRESSINGS) ×4 IMPLANT
STAPLER VISISTAT (STAPLE) ×2 IMPLANT
SURGILUBE 2OZ TUBE FLIPTOP (MISCELLANEOUS) IMPLANT
SUT PDS AB 2-0 CT1 27 (SUTURE) ×4 IMPLANT
SUT VIC AB 3-0 PS2 18 (SUTURE) ×2
SUT VIC AB 3-0 PS2 18XBRD (SUTURE) ×2 IMPLANT
SUT VIC AB 5-0 PS2 18 (SUTURE) IMPLANT
SWAB COLLECTION DEVICE MRSA (MISCELLANEOUS) IMPLANT
TAPE CLOTH SURG 4X10 WHT LF (GAUZE/BANDAGES/DRESSINGS) ×2 IMPLANT
TOWEL OR 17X24 6PK STRL BLUE (TOWEL DISPOSABLE) ×2 IMPLANT
TOWEL OR 17X26 10 PK STRL BLUE (TOWEL DISPOSABLE) ×2 IMPLANT
TUBE ANAEROBIC SPECIMEN COL (MISCELLANEOUS) IMPLANT
UNDERPAD 30X30 INCONTINENT (UNDERPADS AND DIAPERS) ×2 IMPLANT

## 2013-07-17 NOTE — Anesthesia Preprocedure Evaluation (Addendum)
Anesthesia Evaluation  Patient identified by MRN, date of birth, ID band Patient awake    Reviewed: Allergy & Precautions, H&P , NPO status , Patient's Chart, lab work & pertinent test results, reviewed documented beta blocker date and time   History of Anesthesia Complications Negative for: history of anesthetic complications  Airway Mallampati: II TM Distance: >3 FB Neck ROM: Full    Dental  (+) Teeth Intact and Dental Advisory Given   Pulmonary sleep apnea (currently being eval for OSA) , Current Smoker,  breath sounds clear to auscultation  Pulmonary exam normal       Cardiovascular hypertension, Pt. on medications and Pt. on home beta blockers Rhythm:Regular Rate:Normal     Neuro/Psych negative neurological ROS     GI/Hepatic Neg liver ROS, GERD-  Medicated and Controlled,  Endo/Other  Morbid obesity  Renal/GU Renal InsufficiencyRenal disease (creat 1.37)     Musculoskeletal   Abdominal (+) + obese,   Peds  Hematology negative hematology ROS (+)   Anesthesia Other Findings   Reproductive/Obstetrics                         Anesthesia Physical Anesthesia Plan  ASA: III  Anesthesia Plan: General   Post-op Pain Management:    Induction: Intravenous  Airway Management Planned: Oral ETT  Additional Equipment:   Intra-op Plan:   Post-operative Plan: Extubation in OR  Informed Consent: I have reviewed the patients History and Physical, chart, labs and discussed the procedure including the risks, benefits and alternatives for the proposed anesthesia with the patient or authorized representative who has indicated his/her understanding and acceptance.   Dental advisory given  Plan Discussed with: CRNA, Anesthesiologist and Surgeon  Anesthesia Plan Comments: (Plan routine monitors, GETA)      Anesthesia Quick Evaluation

## 2013-07-17 NOTE — Transfer of Care (Signed)
Immediate Anesthesia Transfer of Care Note  Patient: Dylan Dudley  Procedure(s) Performed: Procedure(s): DEBRIDEMENT  OF SKIN, SUBCUTANEOUS TISSUE BONE, MUSCLE FLAP, AND CLOSURE WOUND (N/A)  Patient Location: PACU  Anesthesia Type:General  Level of Consciousness: awake, alert  and oriented  Airway & Oxygen Therapy: Patient Spontanous Breathing and Patient connected to nasal cannula oxygen  Post-op Assessment: Report given to PACU RN and Post -op Vital signs reviewed and stable  Post vital signs: Reviewed and stable  Complications: No apparent anesthesia complications

## 2013-07-17 NOTE — Brief Op Note (Signed)
07/17/2013  9:07 AM  PATIENT:  Helene Shoe Rosensteel  52 y.o. male  PRE-OPERATIVE DIAGNOSIS:  COMPLICATED OPEN WOUND TO BACK  POST-OPERATIVE DIAGNOSIS:  COMPLICATED OPEN WOUND TO BACK  PROCEDURE:  Procedure(s): DEBRIDEMENT  OF SKIN, SUBCUTANEOUS TISSUE BONE, MUSCLE FLAP, AND CLOSURE WOUND (N/A)  SURGEON:  Surgeon(s) and Role:    * Glenna Fellows, MD - Primary  PHYSICIAN ASSISTANT:   ASSISTANTS: none   ANESTHESIA:   general  EBL:     BLOOD ADMINISTERED:none  DRAINS: (15 Fr) Jackson-Pratt drain(s) with closed bulb suction in the upper back, SQ   LOCAL MEDICATIONS USED:  MARCAINE     SPECIMEN:  Biopsy / Limited Resection  DISPOSITION OF SPECIMEN:  PATHOLOGY  COUNTS:  YES  TOURNIQUET:  * No tourniquets in log *  DICTATION: .Note written in EPIC  PLAN OF CARE: Admit for overnight observation  PATIENT DISPOSITION:  PACU - hemodynamically stable.   Delay start of Pharmacological VTE agent (>24hrs) due to surgical blood loss or risk of bleeding: no

## 2013-07-17 NOTE — Progress Notes (Signed)
1400 Discharge  instructions  Given to pt  And spouse . Both verbalized understanding . Wheeled to lobby by Agricultural consultant

## 2013-07-17 NOTE — Interval H&P Note (Signed)
History and Physical Interval Note:  07/17/2013 7:14 AM  Dylan Dudley  has presented today for surgery, with the diagnosis of COMPLICATED OPEN WOUND TO BACK  The various methods of treatment have been discussed with the patient and family. After consideration of risks, benefits and other options for treatment, the patient has consented to  Procedure(s): DEBRIDEMENT  OF SKIN, SUBCUTANEOUS TISSUE BONE, MUSCLE FLAP, AND CLOSURE WOUND (N/A) as a surgical intervention .  The patient's history has been reviewed, patient examined, no change in status, stable for surgery.  I have reviewed the patient's chart and labs.  Questions were answered to the patient's satisfaction.     Jacquelyne Quarry

## 2013-07-17 NOTE — Anesthesia Postprocedure Evaluation (Signed)
  Anesthesia Post-op Note  Patient: Dylan Dudley  Procedure(s) Performed: Procedure(s): DEBRIDEMENT  OF SKIN, SUBCUTANEOUS TISSUE BONE, MUSCLE FLAP, AND CLOSURE WOUND (N/A)  Patient Location: PACU  Anesthesia Type:General  Level of Consciousness: awake, alert , oriented and patient cooperative  Airway and Oxygen Therapy: Patient Spontanous Breathing  Post-op Pain: none  Post-op Assessment: Post-op Vital signs reviewed, Patient's Cardiovascular Status Stable, Respiratory Function Stable, Patent Airway, No signs of Nausea or vomiting and Pain level controlled  Post-op Vital Signs: Reviewed and stable  Complications: No apparent anesthesia complications

## 2013-07-17 NOTE — Op Note (Signed)
Operative Note   DATE OF OPERATION: 07/17/13  SURGICAL DIVISION: Plastic Surgery  PREOPERATIVE DIAGNOSES:  Recurrent ulceration back, open wound of back with complication  POSTOPERATIVE DIAGNOSES:  same  PROCEDURE:  Muscle flap (trapezius and deltoid) to back ulcer following excisional debridement  SURGEON: Glenna Fellows MD MBA  ASSISTANT: none  ANESTHESIA:  General.   COMPLICATIONS: None  EBL: 25 ml   INDICATIONS FOR PROCEDURE:  The patient, Dylan Dudley, is a 52 y.o. male born on Jan 15, 1961, is here for treatment of recurrent ulcer upper back. Patient has history spur removed at time of cervical fusion that has been complicated by recurrent ulceration and history osteomyelitis. Has undergone trapezius flap at [facility] earlier this year. Presented with recurrent drainage; CT scan with fluid collection adjacent to C7 spinous process. Plan for excisional debridement and extension of trapezius flap closure in this area.     DESCRIPTION OF PROCEDURE:  The patient was taken to the operating room. SCDs were placed and IV antibiotics were given. Patient was placed in prone position. After ensuring all pressure points were padded, the patient's operative site was prepped and draped in a sterile fashion. A time out was performed and all information was confirmed to be correct.  Sharp excision of ulcer and its tract down to the right of C7 process completed with knife and cautery. Limited incision caudally in previous scar line completed to expose trapezius muscle. All of chronic scar tissue associated with ulcer excised sharply, including skin, SQ muscle and bone. One piece of mobile bone noted and excised sharply with specimen. Remainder of exposed spinous process hard in nature. Exposed bone curreted and irrigated with solution containing Bacitracin. With clean currete, additional bone specimens obtained for both culture and pathology. Adjacent trapezius and deltoid muscle on both right and left  elevated from borders of ulcer until able to approximate in midline over bone without tension. Skin flaps elevated from muscle until  Similar skin closure could be obtained. Muscle layer approximated in midline with interrupted figure of eight 2-0 PDS sutures. 15 Fr drain placed in SQ position and secured with 2-0 nylon. Buried interrupted 3-0 vicryl in dermis followed by staples for skin closure. Telfa followed by pressure dressing applied. Patient returned to supine position.   The patient was allowed to wake from anesthesia, extubated and taken to the recovery room in satisfactory condition.   Drains: 15 Fr Jp in SQ back  Specimens: 1. Recurrent ulcer back;hx osteomyelitis 2. C7 spinous process for culture 3. C7 spinous process for pathology

## 2013-07-17 NOTE — Preoperative (Signed)
Beta Blockers   Reason not to administer Beta Blockers:Not Applicable 

## 2013-07-18 MED ORDER — OXYCODONE HCL 5 MG PO TABS: 5.0000 mg | ORAL_TABLET | ORAL | Status: DC | PRN

## 2013-07-18 MED ORDER — SULFAMETHOXAZOLE-TMP DS 800-160 MG PO TABS: 1.0000 | ORAL_TABLET | Freq: Two times a day (BID) | ORAL | Status: DC

## 2013-07-18 MED ORDER — SENNOSIDES-DOCUSATE SODIUM 8.6-50 MG PO TABS: 1.0000 | ORAL_TABLET | Freq: Every day | ORAL | Status: DC

## 2013-07-18 NOTE — Progress Notes (Signed)
Explained discharge instructions to pt. And pt. Expressed understanding.  Gave thorough instructions on care of JP drain and demonstrated care of JP drain for pt.  Pt. Verbalized understanding.  Pt. Was given supplies to change dressing.  Prescriptions were given to pt. And pt. Was discharged home with friend.   Vanice Sarah

## 2013-07-18 NOTE — Discharge Summary (Signed)
  Patient presented with recurrent drainage from back with history of osteomyelitis. Underwent excisional debridement and muscle flap closure over bone. Postoperatively patient required IV pain medication requiring observation. On POD# tolerating oral pain medication well and incision intact. Patients' home ID provider aware of current status and records for labs faced. Plan IV antibiotics if bone cultures positive. Will be discharged with oral Bactrim, as wound cultures from OP clinic sensitive to this.

## 2013-07-18 NOTE — Care Management Note (Signed)
    Page 1 of 1   07/18/2013     2:02:38 PM   CARE MANAGEMENT NOTE 07/18/2013  Patient:  Dylan Dudley, Dylan Dudley   Account Number:  [id number]  Date Initiated:  07/18/2013  Documentation initiated by:  [facility]  Subjective/Objective Assessment:   adm: COMPLICATED OPEN WOUND TO BACK     Action/Plan:   discharge planning   Anticipated DC Date:  07/18/2013   Anticipated DC Plan:  HOME/SELF CARE         Choice offered to / List presented to:             Status of service:  Completed, signed off Medicare Important Message given?   (If response is "NO", the following Medicare IM given date fields will be blank) Date Medicare IM given:   Date Additional Medicare IM given:    Discharge Disposition:    Per UR Regulation:    If discussed at Long Length of Stay Meetings, dates discussed:    Comments:  07/18/13 13:50 CM notes pt discharged at 12:07 and spoke to RN as to the [facility] consult.  RN states JP mgmt was reason for [facility] and pt taught by RN how to manage and states he feels comfortable managing this himself.  No other CM needs were communicated.  Freddy Jaksch, BSN, CM [phone].

## 2013-07-20 ENCOUNTER — Encounter (HOSPITAL_COMMUNITY): Payer: Self-pay | Admitting: Plastic Surgery

## 2013-07-21 LAB — TISSUE CULTURE: Gram Stain: NONE SEEN

## 2013-07-23 LAB — ANAEROBIC CULTURE: Gram Stain: NONE SEEN

## 2014-09-12 IMAGING — CR DG CHEST 2V
2 series · 2 of 2 positions shown · non-contrast
Comparison: None.

CLINICAL DATA: For wound debridement, hypertension, smoking history
ppm

EXAM:
CHEST  2 VIEW

[w chest pa]
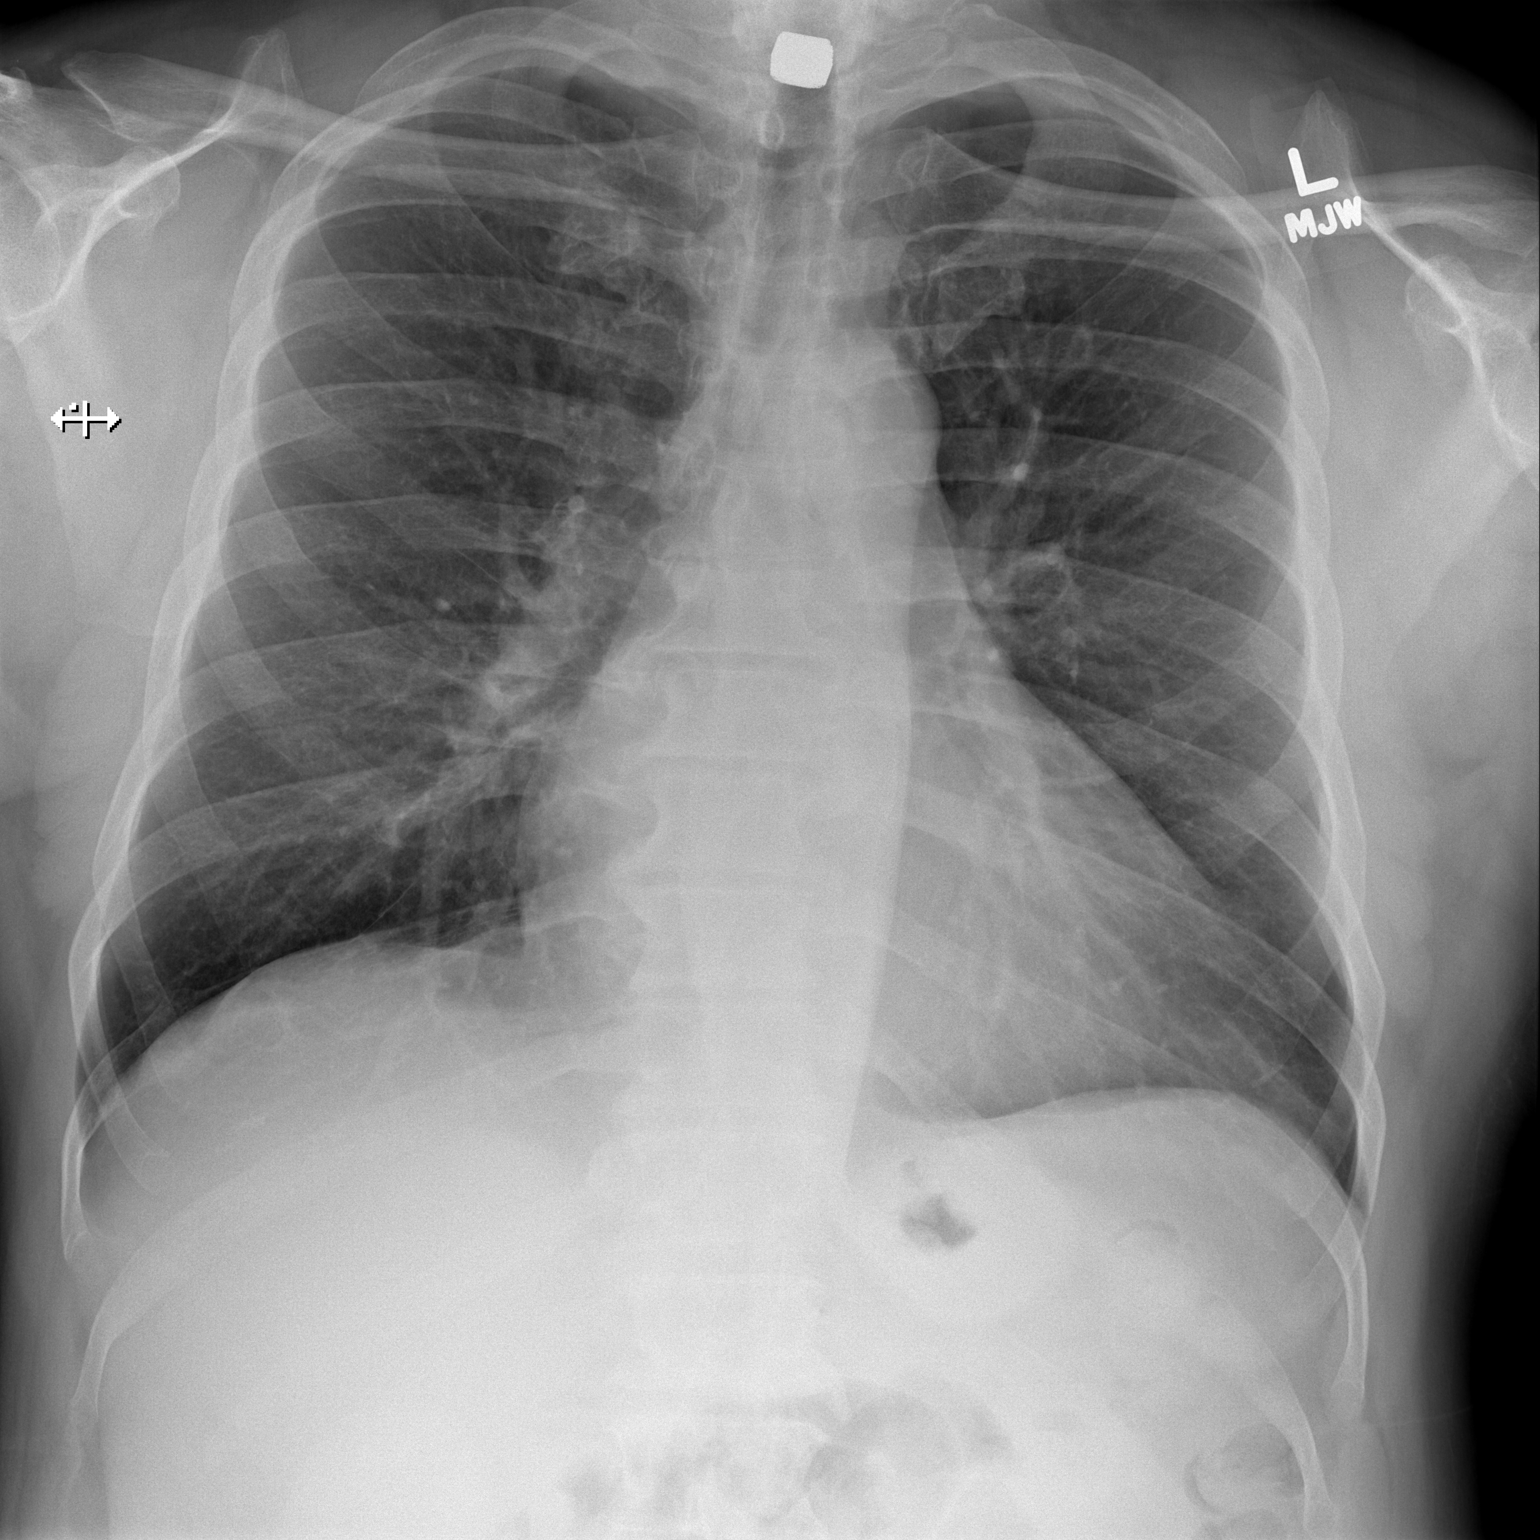

[w chest lat]
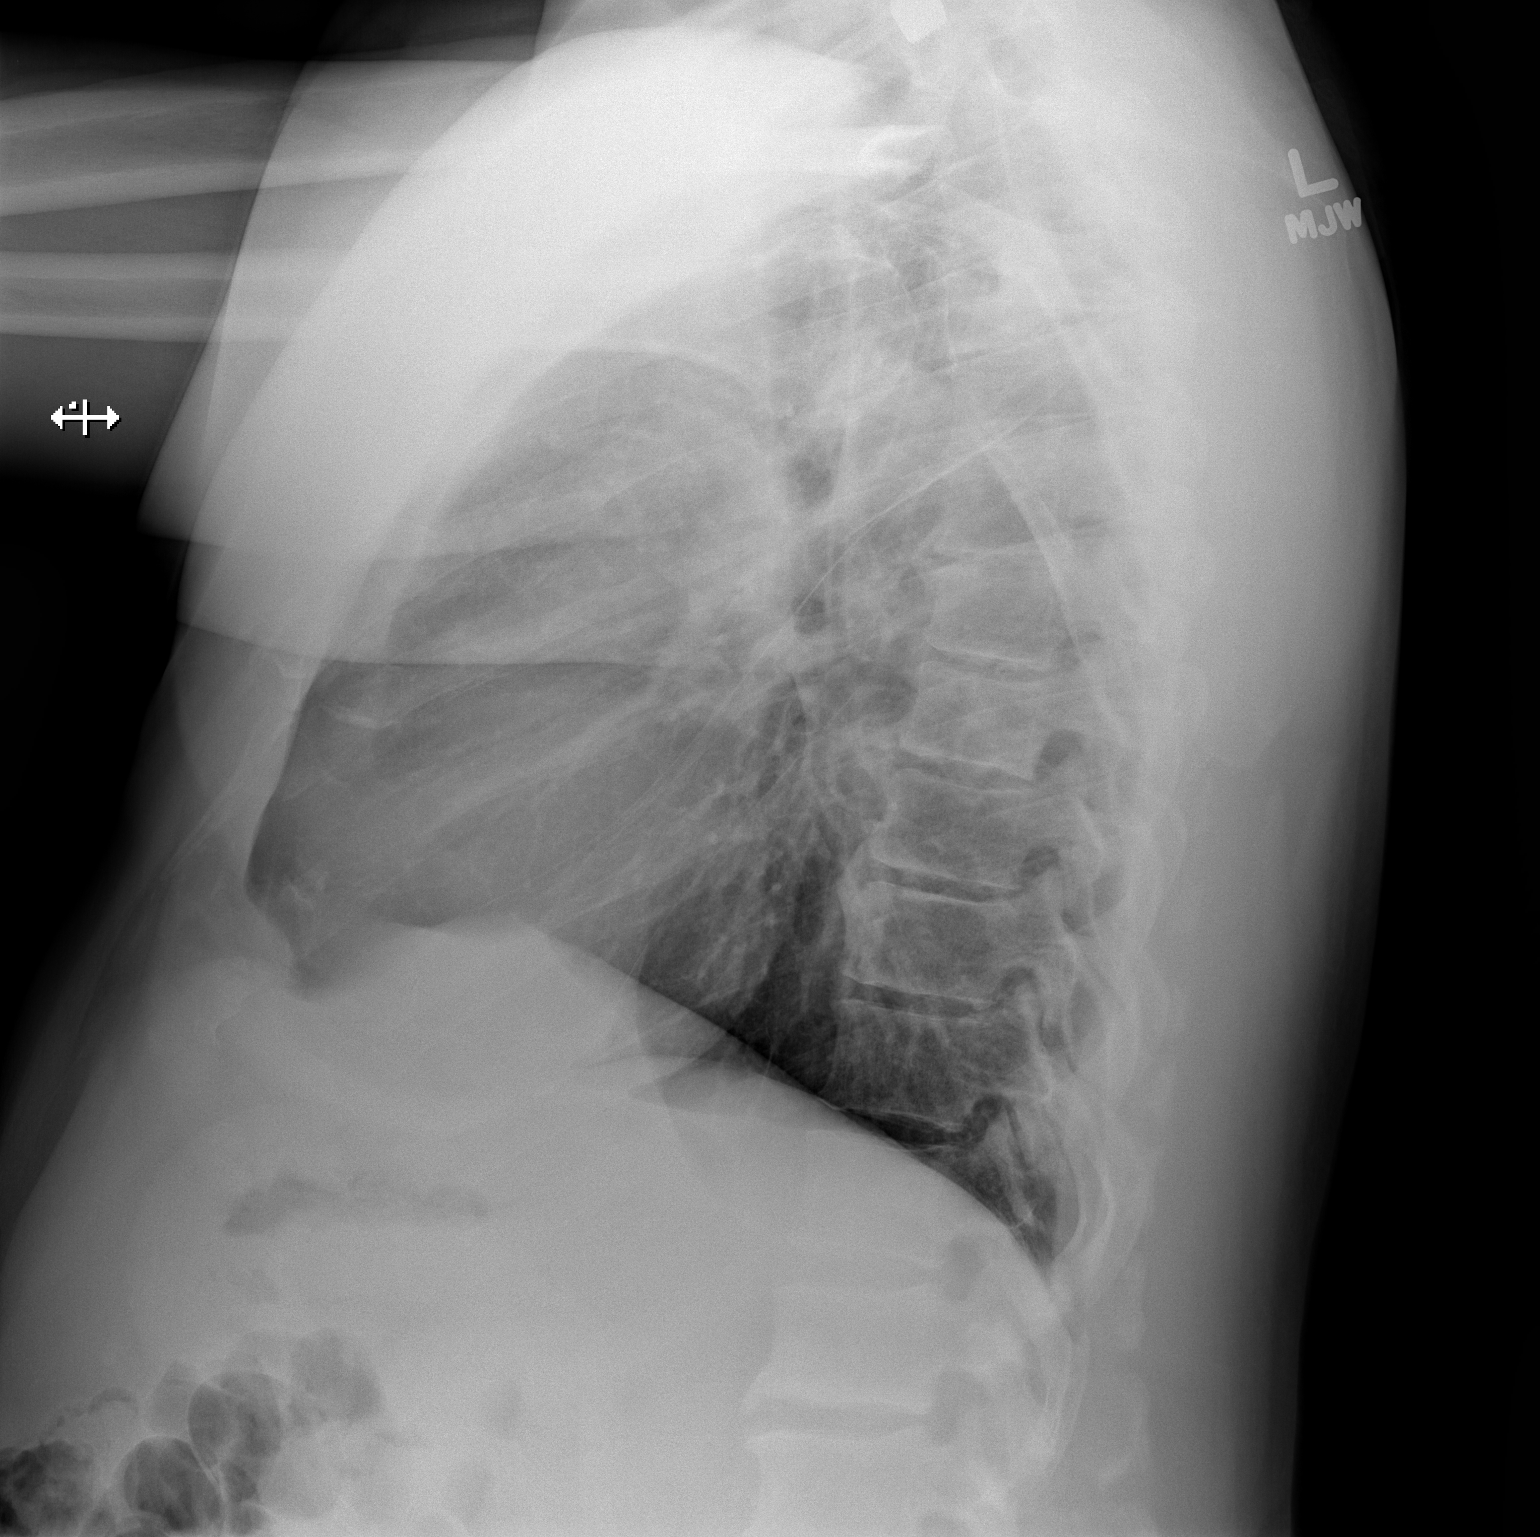

[2 of 2 positions shown; findings below may reference images not displayed]

FINDINGS: No active infiltrate or effusion is seen. Mediastinal contours
appear normal. The heart is mildly enlarged. There is a square metal
opacity overlying the superior tracheal air shadow of questionable
significance, overlying the lower cervical-upper thoracic spine on
the lateral view. Clinical correlation is recommended.
IMPRESSION: 1. No active lung disease.
2. Cardiomegaly.
3. Metallic foreign body overlies the lower cervical -upper thoracic
spine of uncertain significance. Correlate clinically.

## 2015-06-15 ENCOUNTER — Ambulatory Visit (HOSPITAL_COMMUNITY)
Admission: RE | Admit: 2015-06-15 | Discharge: 2015-06-15 | Disposition: A | Discharge status: Home / Self Care | Payer: Self-pay | Source: Ambulatory Visit | Attending: Rheumatology | Admitting: Rheumatology

## 2015-06-15 ENCOUNTER — Other Ambulatory Visit (HOSPITAL_COMMUNITY): Payer: Self-pay | Admitting: Rheumatology

## 2015-06-15 DIAGNOSIS — Z0000 Encounter for general adult medical examination without abnormal findings: Secondary | ICD-10-CM | POA: Insufficient documentation

## 2015-06-15 DIAGNOSIS — F17210 Nicotine dependence, cigarettes, uncomplicated: Secondary | ICD-10-CM | POA: Insufficient documentation

## 2016-06-30 ENCOUNTER — Other Ambulatory Visit: Payer: Self-pay | Admitting: Radiology

## 2016-06-30 DIAGNOSIS — Z79899 Other long term (current) drug therapy: Secondary | ICD-10-CM

## 2016-07-23 ENCOUNTER — Other Ambulatory Visit: Payer: Self-pay | Admitting: Rheumatology

## 2016-07-23 NOTE — Telephone Encounter (Signed)
04/11/16 last visit Next visit 09/01/16

## 2016-07-23 NOTE — Telephone Encounter (Signed)
Patient due for labs for refill of MTX

## 2016-07-24 NOTE — Telephone Encounter (Signed)
Called patient to advise him labs are due for MTX patient states he had labs done last week in [place] was advised they were normal. He will ask them to fax to [place] again  [facility] to refill MTX per SD

## 2016-09-10 DIAGNOSIS — M0579 Rheumatoid arthritis with rheumatoid factor of multiple sites without organ or systems involvement: Secondary | ICD-10-CM | POA: Insufficient documentation

## 2016-09-10 DIAGNOSIS — M19071 Primary osteoarthritis, right ankle and foot: Secondary | ICD-10-CM | POA: Insufficient documentation

## 2016-09-10 DIAGNOSIS — F17200 Nicotine dependence, unspecified, uncomplicated: Secondary | ICD-10-CM | POA: Insufficient documentation

## 2016-09-10 DIAGNOSIS — M19041 Primary osteoarthritis, right hand: Secondary | ICD-10-CM | POA: Insufficient documentation

## 2016-09-10 DIAGNOSIS — I2510 Atherosclerotic heart disease of native coronary artery without angina pectoris: Secondary | ICD-10-CM | POA: Insufficient documentation

## 2016-09-10 DIAGNOSIS — M19042 Primary osteoarthritis, left hand: Secondary | ICD-10-CM

## 2016-09-10 DIAGNOSIS — M170 Bilateral primary osteoarthritis of knee: Secondary | ICD-10-CM | POA: Insufficient documentation

## 2016-09-10 DIAGNOSIS — M19072 Primary osteoarthritis, left ankle and foot: Secondary | ICD-10-CM

## 2016-09-10 DIAGNOSIS — Z8669 Personal history of other diseases of the nervous system and sense organs: Secondary | ICD-10-CM | POA: Insufficient documentation

## 2016-09-10 DIAGNOSIS — M47812 Spondylosis without myelopathy or radiculopathy, cervical region: Secondary | ICD-10-CM | POA: Insufficient documentation

## 2016-09-10 DIAGNOSIS — Z79899 Other long term (current) drug therapy: Secondary | ICD-10-CM | POA: Insufficient documentation

## 2016-09-10 NOTE — Progress Notes (Signed)
Office Visit Note  Patient: Dylan Dudley             Date of Birth: 1961/06/20           MRN: [id number]             PCP: Elise Benne, MD Referring: Elise Benne, MD Visit Date: 09/11/2016 Occupation: Disability    Subjective:  Pain knees and neck pain.Marland Kitchen   History of Present Illness: Dylan Dudley is a 55 y.o. male  with history of rheumatoid arthritis, osteoarthritis and disc disease. He states she's been having a lot of discomfort in his neck and his knee joints. He denies any joint swelling.  Activities of Daily Living:  Patient reports morning stiffness for 30 minutes.   Patient Reports nocturnal pain.  Difficulty dressing/grooming: Denies Difficulty climbing stairs: Reports Difficulty getting out of chair: Reports Difficulty using hands for taps, buttons, cutlery, and/or writing: Denies   Review of Systems  Constitutional: Positive for weight gain. Negative for fatigue, night sweats and weakness ( ).  HENT: Negative for mouth sores, mouth dryness and nose dryness.   Eyes: Negative for redness and dryness.  Respiratory: Negative for shortness of breath and difficulty breathing.   Cardiovascular: Negative for chest pain, palpitations, hypertension, irregular heartbeat and swelling in legs/feet.  Gastrointestinal: Negative for constipation and diarrhea.  Endocrine: Negative for increased urination.  Musculoskeletal: Positive for arthralgias, joint pain, muscle weakness and morning stiffness. Negative for joint swelling, myalgias, muscle tenderness and myalgias.  Skin: Negative for color change, rash, hair loss, nodules/bumps, skin tightness, ulcers and sensitivity to sunlight.  Allergic/Immunologic: Negative for susceptible to infections.  Neurological: Negative for dizziness, fainting, memory loss and night sweats.  Hematological: Negative for swollen glands.  Psychiatric/Behavioral: Positive for depressed mood and sleep disturbance. The patient is  nervous/anxious.     PMFS History:  Patient Active Problem List   Diagnosis Date Noted  . Rheumatoid arthritis with rheumatoid factor of multiple sites without organ or systems involvement (HCC) 09/10/2016  . Primary osteoarthritis of both hands 09/10/2016  . Primary osteoarthritis of both knees 09/10/2016  . Primary osteoarthritis of both feet 09/10/2016  . DJD (degenerative joint disease), cervical 09/10/2016  . History of tremor 09/10/2016  . History of sleep apnea 09/10/2016  . Coronary artery disease involving native heart 09/10/2016  . Smoker 09/10/2016  . High risk medication use 09/10/2016  . Tear of right rotator cuff 03/14/2012  . Impingement syndrome of right shoulder 03/14/2012  . AC (acromioclavicular) arthritis, right 03/14/2012  . Disorder of tendon of biceps 03/14/2012    Past Medical History:  Diagnosis Date  . AC (acromioclavicular) arthritis 03/14/2012  . Anxiety   . Arthritis    "shoulders; knees" (07/17/2013)  . DDD (degenerative disc disease)   . Depression   . Disorder of tendon of biceps 03/14/2012  . GERD (gastroesophageal reflux disease)   . Hyperlipemia   . Hypertension   . Impingement syndrome of right shoulder 03/14/2012  . Pneumonia 2012   "once" (07/17/2013)  . Seasonal allergies   . Sleep apnea    "did test 2 months ago; get results in December" (07/17/2013)  . Tear of right rotator cuff 03/14/2012    Family History  Problem Relation Age of Onset  . Breast cancer Mother   . Hypertension Mother   . Hypertension Father   . Multiple sclerosis Sister    Past Surgical History:  Procedure Laterality Date  . ANTERIOR CERVICAL DECOMP/DISCECTOMY FUSION  08/2011   [place]  . BUNIONECTOMY Bilateral 1998  . DEBRIDEMENT AND CLOSURE WOUND  12/2012   "back of my neck" (07/17/2013)  . DEBRIDEMENT AND CLOSURE WOUND N/A 07/17/2013   Procedure: DEBRIDEMENT  OF SKIN, SUBCUTANEOUS TISSUE BONE, MUSCLE FLAP, AND CLOSURE WOUND;  Surgeon: Glenna Fellows, MD;  Location: [facility] OR;  Service: Plastics;  Laterality: N/A;  . EXCISIONAL HEMORRHOIDECTOMY  ~ 1996  . INCISION AND DRAINAGE OF WOUND  10/2012 X 2   "back of my neck" (07/17/2013)  . MUSCLE FLAP CLOSURE  07/17/2013   "to back of my neck" (07/17/2013)  . NECK SURGERY  09/2012   "spur removed from back of my neck" (07/17/2013)  . SHOULDER ARTHROSCOPY Left 2008  . SHOULDER ARTHROSCOPY W/ ROTATOR CUFF REPAIR Right 2013   "and bicep repair" (07/17/2013)  . TONSILLECTOMY  ~ 3   Social History   Social History Narrative  . No narrative on file     Objective: Vital Signs: BP 130/82 (BP Location: Left Arm, Patient Position: Sitting, Cuff Size: Large)   Pulse 72   Resp 14   Ht 6\' 2"  (1.88 m)   Wt 266 lb (120.7 kg)   BMI 34.15 kg/m    Physical Exam  Constitutional: He is oriented to person, place, and time. He appears well-developed and well-nourished.  HENT:  Head: Normocephalic and atraumatic.  Eyes: Conjunctivae and EOM are normal. Pupils are equal, round, and reactive to light.  Neck: Normal range of motion. Neck supple.  Cardiovascular: Normal rate, regular rhythm and normal heart sounds.   Pulmonary/Chest: Effort normal and breath sounds normal.  Abdominal: Soft. Bowel sounds are normal.  Neurological: He is alert and oriented to person, place, and time.  Skin: Skin is warm and dry. Capillary refill takes less than 2 seconds.  Psychiatric: He has a normal mood and affect. His behavior is normal.  Nursing note and vitals reviewed.    Musculoskeletal Exam: He has limited range of motion of his C-spine. Thoracic and lumbar spine good range of motion although he has discomfort with range of motion of his lumbar spine. Shoulder joint abduction is limited bilaterally with discomfort elbow joints wrist joint MCPs PIPs DIPs with good range of motion with no synovitis. Hip joints knee joints ankles MTPs PIPs with good range of motion with no synovitis. He had discomfort with  range of motion of bilateral knee joints but no synovitis was noted.  CDAI Exam: CDAI Homunculus Exam:   Tenderness:  RLE: tibiofemoral LLE: tibiofemoral  Joint Counts:  CDAI Tender Joint count: 2 CDAI Swollen Joint count: 0  Global Assessments:  Patient Global Assessment: 5 Provider Global Assessment: 5  CDAI Calculated Score: 12    Investigation: Findings:  History of CCP positive rheumatoid arthritis.    04/11/2016 Rapid3 score was 7.5 which was mostly coming from C-spine pain.    04/11/16 X-rays of the bilateral hands 2 views, show bilateral 2nd and 3rd MCP minimal narrowing, bilateral CMC narrowing, PIP and DIP narrowing without any erosive changes.  These findings are consistent with osteoarthritis and rheumatoid arthritis overlap.  His bilateral feet x-rays show bilateral 1st MTP narrowing and PIP and DIP narrowing and left 5th metatarsal pin and bilateral 1st MTP postsurgical changes.  There are bilateral calcaneal spur findings consistent with osteoarthritis  Labs from 05/18/2014 show lupus anticoagulant negative.  Cell count with crystals showed negative crystals and no organisms.  CCP is positive at 164.6.  Sed rate is normal at 6.  Hepatitis panel is negative.  Uric acid is normal.  CK is normal.  Angiotensin 1 converting enzyme is normal.  TSH is normal.  HIV is normal.  IgG, IgA and IgM are normal.  C3 and C4 are normal.  ANA is negative.  SPEP and M-spike are negative.  ENA is negative.  Beta-2-glycoprotein is negative.  Anticardiolipin is negative.    06/07/2014 TB gold negative, G6PD normal 12.6 High risk medication use Methotrexate the patient has discontinued his Plaquenil per our September 14, 2015, advice because of his history of neutropenia.      Imaging: No results found.  Speciality Comments: No specialty comments available.    Procedures:  No procedures performed Allergies: Latex and Vancomycin   Assessment / Plan:     Visit Diagnoses:  Rheumatoid arthritis with positive anti-CCP antibody. He does not have any synovitis on examination although he continues to have multiple arthralgias. His arthritis seems to be quite well-controlled on methotrexate.  High risk medication use - Methotrexate 8/week, folic acid 2 mg by mouth daily -his labs are past-due I'll obtain his labs today and then every 3 months to monitor for drug toxicity Plan: CBC with Differential/Platelet, COMPLETE METABOLIC PANEL WITH GFR, CBC with Differential/Platelet, COMPLETE METABOLIC PANEL WITH GFR  Association of heart disease with rheumatoid arthritis was discussed. Need to monitor blood pressure, cholesterol, and to exercise 30-60 minutes on daily basis was discussed. Poor dental hygiene can be a predisposing factor for rheumatoid arthritis. Good dental hygiene was discussed.  Primary osteoarthritis of both hands: He continues to have some stiffness  Primary osteoarthritis of both knees: He complains of the stiffness in his bilateral knee joints he has no synovitis on examination. Weight loss diet and exercise was discussed.  Primary osteoarthritis of both feet: Chronic discomfort  DJD (degenerative joint disease), cervical: He has limited range of motion   Impingement syndrome of right shoulder: He has limited range of motion  He has following medical problems for which she's been seen by other physicians:  History of tremor  History of sleep apnea  Coronary artery disease involving native heart, angina presence unspecified, unspecified vessel or lesion type  Smoker: Smoking cessation was discussed.   Other neutropenia (HCC) - The patient has discontinued his Plaquenil per our September 14, 2015, advice because of his history of neutropenia   High risk medication use  Orders: Orders Placed This Encounter  Procedures  . CBC with Differential/Platelet  . COMPLETE METABOLIC PANEL WITH GFR  . CBC with Differential/Platelet  . COMPLETE METABOLIC  PANEL WITH GFR   No orders of the defined types were placed in this encounter.   Face-to-face time spent with patient was 30 minutes. 50% of time was spent in counseling and coordination of care.  Follow-Up Instructions: Return in about 5 months (around 02/09/2017) for Rheumatoid arthritis.   Pollyann Savoy, MD

## 2016-09-11 ENCOUNTER — Encounter: Payer: Self-pay | Admitting: Rheumatology

## 2016-09-11 ENCOUNTER — Ambulatory Visit (INDEPENDENT_AMBULATORY_CARE_PROVIDER_SITE_OTHER): Payer: Medicare (Managed Care) | Admitting: Rheumatology

## 2016-09-11 DIAGNOSIS — M19042 Primary osteoarthritis, left hand: Secondary | ICD-10-CM

## 2016-09-11 DIAGNOSIS — M0579 Rheumatoid arthritis with rheumatoid factor of multiple sites without organ or systems involvement: Secondary | ICD-10-CM

## 2016-09-11 DIAGNOSIS — F17200 Nicotine dependence, unspecified, uncomplicated: Secondary | ICD-10-CM

## 2016-09-11 DIAGNOSIS — D708 Other neutropenia: Secondary | ICD-10-CM

## 2016-09-11 DIAGNOSIS — M19072 Primary osteoarthritis, left ankle and foot: Secondary | ICD-10-CM

## 2016-09-11 DIAGNOSIS — M5030 Other cervical disc degeneration, unspecified cervical region: Secondary | ICD-10-CM

## 2016-09-11 DIAGNOSIS — M19041 Primary osteoarthritis, right hand: Secondary | ICD-10-CM

## 2016-09-11 DIAGNOSIS — M47812 Spondylosis without myelopathy or radiculopathy, cervical region: Secondary | ICD-10-CM

## 2016-09-11 DIAGNOSIS — Z8669 Personal history of other diseases of the nervous system and sense organs: Secondary | ICD-10-CM

## 2016-09-11 DIAGNOSIS — M170 Bilateral primary osteoarthritis of knee: Secondary | ICD-10-CM

## 2016-09-11 DIAGNOSIS — G250 Essential tremor: Secondary | ICD-10-CM

## 2016-09-11 DIAGNOSIS — Z79899 Other long term (current) drug therapy: Secondary | ICD-10-CM

## 2016-09-11 DIAGNOSIS — M19071 Primary osteoarthritis, right ankle and foot: Secondary | ICD-10-CM

## 2016-09-11 DIAGNOSIS — I2510 Atherosclerotic heart disease of native coronary artery without angina pectoris: Secondary | ICD-10-CM

## 2016-09-11 DIAGNOSIS — M7541 Impingement syndrome of right shoulder: Secondary | ICD-10-CM

## 2016-09-11 DIAGNOSIS — M19019 Primary osteoarthritis, unspecified shoulder: Secondary | ICD-10-CM

## 2016-09-11 LAB — CBC WITH DIFFERENTIAL/PLATELET
BASOS PCT: 1 %
Basophils Absolute: 44 cells/uL (ref 0–200)
Eosinophils Absolute: 88 cells/uL (ref 15–500)
Eosinophils Relative: 2 %
HEMATOCRIT: 41.9 % (ref 38.5–50.0)
Hemoglobin: 14.2 g/dL (ref 13.2–17.1)
LYMPHS PCT: 27 %
Lymphs Abs: 1188 cells/uL (ref 850–3900)
MCH: 32.9 pg (ref 27.0–33.0)
MCHC: 33.9 g/dL (ref 32.0–36.0)
MCV: 97 fL (ref 80.0–100.0)
MONO ABS: 352 {cells}/uL (ref 200–950)
MONOS PCT: 8 %
MPV: 9.2 fL (ref 7.5–12.5)
NEUTROS ABS: 2728 {cells}/uL (ref 1500–7800)
Neutrophils Relative %: 62 %
PLATELETS: 266 10*3/uL (ref 140–400)
RBC: 4.32 MIL/uL (ref 4.20–5.80)
RDW: 14.7 % (ref 11.0–15.0)
WBC: 4.4 10*3/uL (ref 3.8–10.8)

## 2016-09-11 LAB — COMPLETE METABOLIC PANEL WITH GFR
ALT: 29 U/L (ref 9–46)
AST: 25 U/L (ref 10–35)
Albumin: 4.4 g/dL (ref 3.6–5.1)
Alkaline Phosphatase: 52 U/L (ref 40–115)
BUN: 18 mg/dL (ref 7–25)
CHLORIDE: 101 mmol/L (ref 98–110)
CO2: 28 mmol/L (ref 20–31)
CREATININE: 1.35 mg/dL — AB (ref 0.70–1.33)
Calcium: 9.4 mg/dL (ref 8.6–10.3)
GFR, Est African American: 68 mL/min (ref >=60)
GFR, Est Non African American: 59 mL/min — ABNORMAL LOW (ref >=60)
GLUCOSE: 115 mg/dL — AB (ref 65–99)
Potassium: 4.5 mmol/L (ref 3.5–5.3)
Sodium: 136 mmol/L (ref 135–146)
TOTAL PROTEIN: 7.3 g/dL (ref 6.1–8.1)
Total Bilirubin: 0.7 mg/dL (ref 0.2–1.2)

## 2016-09-11 NOTE — Patient Instructions (Signed)
Standing Labs We placed an order today for your standing lab work.    Please come back and get your standing labs in March and every 3 months  We have open lab Monday through Friday from 8:30-11:30 AM and 1:30-4 PM at the office of Dr. Areana Kosanke/Naitik Panwala, PA.   The office is located at [address], [address], [place], [place] [address] No appointment is necessary.   Labs are drawn by [facility].  You may receive a bill from [facility] for your lab work.    

## 2016-09-12 NOTE — Progress Notes (Signed)
Stable lsbs

## 2016-10-12 ENCOUNTER — Other Ambulatory Visit: Payer: Self-pay | Admitting: Rheumatology

## 2016-10-12 NOTE — Telephone Encounter (Signed)
Labs and office visit 09/11/16 Next visit 02/11/17 Ok to refill per Dr Corliss Skains

## 2017-01-09 ENCOUNTER — Other Ambulatory Visit: Payer: Self-pay | Admitting: Rheumatology

## 2017-01-10 ENCOUNTER — Other Ambulatory Visit: Payer: Self-pay | Admitting: Rheumatology

## 2017-01-10 NOTE — Telephone Encounter (Signed)
Last Visit: 09/11/16 Next Visit: 02/14/17 Labs: 09/11/16 Stable Patient is contacting PCP today to have labs done  Okay to refill 30 day supply MTX?

## 2017-01-10 NOTE — Telephone Encounter (Signed)
ok 

## 2017-01-11 LAB — COMPREHENSIVE METABOLIC PANEL
ALBUMIN: 4.6 g/dL (ref 3.5–5.5)
ALK PHOS: 68 IU/L (ref 39–117)
ALT: 38 IU/L (ref 0–44)
AST: 33 IU/L (ref 0–40)
Albumin/Globulin Ratio: 1.7 (ref 1.2–2.2)
BILIRUBIN TOTAL: 0.7 mg/dL (ref 0.0–1.2)
BUN / CREAT RATIO: 13 (ref 9–20)
BUN: 20 mg/dL (ref 6–24)
CHLORIDE: 98 mmol/L (ref 96–106)
CO2: 27 mmol/L (ref 18–29)
Calcium: 9.7 mg/dL (ref 8.7–10.2)
Creatinine, Ser: 1.52 mg/dL — ABNORMAL HIGH (ref 0.76–1.27)
GFR calc Af Amer: 59 mL/min/{1.73_m2} — ABNORMAL LOW (ref >59)
GFR calc non Af Amer: 51 mL/min/{1.73_m2} — ABNORMAL LOW (ref >59)
GLOBULIN, TOTAL: 2.7 g/dL (ref 1.5–4.5)
GLUCOSE: 118 mg/dL — AB (ref 65–99)
Potassium: 4.1 mmol/L (ref 3.5–5.2)
SODIUM: 139 mmol/L (ref 134–144)
Total Protein: 7.3 g/dL (ref 6.0–8.5)

## 2017-01-11 LAB — CBC WITH DIFFERENTIAL/PLATELET
Basophils Absolute: 0 10*3/uL (ref 0.0–0.2)
Basos: 1 %
EOS (ABSOLUTE): 0.1 10*3/uL (ref 0.0–0.4)
Eos: 3 %
HEMATOCRIT: 41.3 % (ref 37.5–51.0)
Hemoglobin: 13.8 g/dL (ref 13.0–17.7)
Immature Grans (Abs): 0 10*3/uL (ref 0.0–0.1)
Immature Granulocytes: 0 %
LYMPHS ABS: 1 10*3/uL (ref 0.7–3.1)
Lymphs: 26 %
MCH: 31.9 pg (ref 26.6–33.0)
MCHC: 33.4 g/dL (ref 31.5–35.7)
MCV: 95 fL (ref 79–97)
MONOCYTES: 8 %
MONOS ABS: 0.3 10*3/uL (ref 0.1–0.9)
NEUTROS ABS: 2.5 10*3/uL (ref 1.4–7.0)
Neutrophils: 62 %
Platelets: 301 10*3/uL (ref 150–379)
RBC: 4.33 x10E6/uL (ref 4.14–5.80)
RDW: 15.1 % (ref 12.3–15.4)
WBC: 4 10*3/uL (ref 3.4–10.8)

## 2017-01-14 ENCOUNTER — Telehealth: Payer: Self-pay | Admitting: Radiology

## 2017-01-14 NOTE — Telephone Encounter (Signed)
I have called pt to advise he will decrease to 6 tablets per week, and let us know if he has any increased joint pain, Dr Corliss Skains did say he may do fine on 7 per week, but if he does well on 6, we can keep him at 6 per week  He voiced understanding and will let us know if he has problems.

## 2017-01-14 NOTE — Telephone Encounter (Signed)
-----   Message from Pollyann Savoy, MD sent at 01/14/2017  1:04 PM EDT ----- Reduce methotrexate to 6 tablets by mouth every week

## 2017-01-14 NOTE — Progress Notes (Signed)
Reduce methotrexate to 6 tablets by mouth every week

## 2017-02-01 NOTE — Progress Notes (Signed)
Office Visit Note  Patient: Dylan Dudley             Date of Birth: [birth year]           MRN: [id number]             PCP: Elise Benne, MD Referring: Elise Benne, MD Visit Date: 02/14/2017 Occupation: @GUAROCC @    Subjective:  Medication Management   History of Present Illness: Dylan Dudley is a 56 y.o. male  Last seen December 2017 by Dr. January 2018 for rheumatoid arthritis with positive CCP and high risk prescription.  Patient is doing well with rheumatoid arthritis. Changes medications for methotrexate 8 per week to 6 per week after his kidney function showed increase in creatinine at 1.52 and decrease in GFR at 59.  At the current dose of methotrexate (he takes every Friday), he is not having any joint pains stiffness and swelling. He can tell no difference between the 8 pills per week and 6 pills per week. He's been on this current dose for the last 1 month.  He scheduled to get repeat labs mid June 2018 and we will monitor his kidney function.  He has ongoing pain to his right shoulder joint for which she sees Dr. July 2018. Patient has been getting cortisone injections in the right shoulder joint through his office. He continues to have pain and discomfort and he will discuss this further with their office. He also reports that with certain range of motion of his right shoulder joint his pain goes to the neck and then shoots down his right arm up to the forearm. He has a history of thoracic spinal surgery. We will discuss all of this with Dr. Dion Saucier office and contact appropriate neurologist if problem continues.    Activities of Daily Living:  Patient reports morning stiffness for 10 minutes.   Patient Denies nocturnal pain.  Difficulty dressing/grooming: Denies Difficulty climbing stairs: Denies Difficulty getting out of chair: Denies Difficulty using hands for taps, buttons, cutlery, and/or writing: Reports   Review of Systems    Constitutional: Negative for fatigue.  HENT: Negative for mouth sores and mouth dryness.   Eyes: Negative for dryness.  Respiratory: Negative for shortness of breath.   Gastrointestinal: Negative for constipation and diarrhea.  Musculoskeletal: Negative for myalgias and myalgias.  Skin: Negative for sensitivity to sunlight.  Neurological: Negative for memory loss.  Psychiatric/Behavioral: Negative for sleep disturbance.    PMFS History:  Patient Active Problem List   Diagnosis Date Noted  . Rheumatoid arthritis with rheumatoid factor of multiple sites without organ or systems involvement (HCC) 09/10/2016  . Primary osteoarthritis of both hands 09/10/2016  . Primary osteoarthritis of both knees 09/10/2016  . Primary osteoarthritis of both feet 09/10/2016  . DJD (degenerative joint disease), cervical 09/10/2016  . History of tremor 09/10/2016  . History of sleep apnea 09/10/2016  . Coronary artery disease involving native heart 09/10/2016  . Smoker 09/10/2016  . High risk medication use 09/10/2016  . Tear of right rotator cuff 03/14/2012  . Impingement syndrome of right shoulder 03/14/2012  . AC (acromioclavicular) arthritis, right 03/14/2012  . Disorder of tendon of biceps 03/14/2012    Past Medical History:  Diagnosis Date  . AC (acromioclavicular) arthritis 03/14/2012  . Anxiety   . Arthritis    "shoulders; knees" (07/17/2013)  . DDD (degenerative disc disease)   . Depression   . Disorder of tendon of biceps 03/14/2012  . GERD (gastroesophageal reflux disease)   .  Hyperlipemia   . Hypertension   . Impingement syndrome of right shoulder 03/14/2012  . Pneumonia 2012   "once" (07/17/2013)  . Seasonal allergies   . Sleep apnea    "did test 2 months ago; get results in December" (07/17/2013)  . Tear of right rotator cuff 03/14/2012    Family History  Problem Relation Age of Onset  . Breast cancer Mother   . Hypertension Mother   . Hypertension Father   . Multiple  sclerosis Sister    Past Surgical History:  Procedure Laterality Date  . ANTERIOR CERVICAL DECOMP/DISCECTOMY FUSION  08/2011   [place]  . BUNIONECTOMY Bilateral 1998  . DEBRIDEMENT AND CLOSURE WOUND  12/2012   "back of my neck" (07/17/2013)  . DEBRIDEMENT AND CLOSURE WOUND N/A 07/17/2013   Procedure: DEBRIDEMENT  OF SKIN, SUBCUTANEOUS TISSUE BONE, MUSCLE FLAP, AND CLOSURE WOUND;  Surgeon: Glenna Fellows, MD;  Location: [facility] OR;  Service: Plastics;  Laterality: N/A;  . EXCISIONAL HEMORRHOIDECTOMY  ~ 1996  . INCISION AND DRAINAGE OF WOUND  10/2012 X 2   "back of my neck" (07/17/2013)  . MUSCLE FLAP CLOSURE  07/17/2013   "to back of my neck" (07/17/2013)  . NECK SURGERY  09/2012   "spur removed from back of my neck" (07/17/2013)  . SHOULDER ARTHROSCOPY Left 2008  . SHOULDER ARTHROSCOPY W/ ROTATOR CUFF REPAIR Right 2013   "and bicep repair" (07/17/2013)  . TONSILLECTOMY  ~ 66   Social History   Social History Narrative  . No narrative on file     Objective: Vital Signs: BP 138/80   Pulse 80   Resp 16   Ht 6\' 2"  (1.88 m)   Wt 266 lb (120.7 kg)   BMI 34.15 kg/m    Physical Exam  Constitutional: He is oriented to person, place, and time. He appears well-developed and well-nourished.  HENT:  Head: Normocephalic and atraumatic.  Eyes: Conjunctivae and EOM are normal. Pupils are equal, round, and reactive to light.  Neck: Normal range of motion. Neck supple.  Cardiovascular: Normal rate, regular rhythm and normal heart sounds.  Exam reveals no gallop and no friction rub.   No murmur heard. Pulmonary/Chest: Effort normal and breath sounds normal. No respiratory distress. He has no wheezes. He has no rales. He exhibits no tenderness.  Abdominal: Soft. He exhibits no distension and no mass. There is no tenderness. There is no guarding.  Musculoskeletal: Normal range of motion.  Lymphadenopathy:    He has no cervical adenopathy.  Neurological: He is alert and oriented to  person, place, and time. He exhibits normal muscle tone. Coordination normal.  Skin: Skin is warm and dry. Capillary refill takes less than 2 seconds. No rash noted.  Psychiatric: He has a normal mood and affect. His behavior is normal. Judgment and thought content normal.  Vitals reviewed.    Musculoskeletal Exam:  Full range of motion of all joints except decreased range of motion of right shoulder joint secondary to pain. Patient has approximately 100 of abduction. Decreased range of motion of neck. (History of prior surgery of the upper thoracic spine). Grip strength is equal and strong bilaterally. Fibromyalgia tender points are all absent  CDAI Exam: No CDAI exam completed.  No synovitis on examination  Investigation: No additional findings. Orders Only on 01/10/2017  Component Date Value Ref Range Status  . WBC 01/10/2017 4.0  3.4 - 10.8 x10E3/uL Final  . RBC 01/10/2017 4.33  4.14 - 5.80 x10E6/uL Final  . Hemoglobin 01/10/2017  13.8  13.0 - 17.7 g/dL Final  . Hematocrit 93/79/0240 41.3  37.5 - 51.0 % Final  . MCV 01/10/2017 95  79 - 97 fL Final  . MCH 01/10/2017 31.9  26.6 - 33.0 pg Final  . MCHC 01/10/2017 33.4  31.5 - 35.7 g/dL Final  . RDW 97/35/3299 15.1  12.3 - 15.4 % Final  . Platelets 01/10/2017 301  150 - 379 x10E3/uL Final  . Neutrophils 01/10/2017 62  Not Estab. % Final  . Lymphs 01/10/2017 26  Not Estab. % Final  . Monocytes 01/10/2017 8  Not Estab. % Final  . Eos 01/10/2017 3  Not Estab. % Final  . Basos 01/10/2017 1  Not Estab. % Final  . Neutrophils Absolute 01/10/2017 2.5  1.4 - 7.0 x10E3/uL Final  . Lymphocytes Absolute 01/10/2017 1.0  0.7 - 3.1 x10E3/uL Final  . Monocytes Absolute 01/10/2017 0.3  0.1 - 0.9 x10E3/uL Final  . EOS (ABSOLUTE) 01/10/2017 0.1  0.0 - 0.4 x10E3/uL Final  . Basophils Absolute 01/10/2017 0.0  0.0 - 0.2 x10E3/uL Final  . Immature Granulocytes 01/10/2017 0  Not Estab. % Final  . Immature Grans (Abs) 01/10/2017 0.0  0.0 - 0.1  x10E3/uL Final  . Glucose 01/10/2017 118* 65 - 99 mg/dL Final  . BUN 24/26/8341 20  6 - 24 mg/dL Final  . Creatinine, Ser 01/10/2017 1.52* 0.76 - 1.27 mg/dL Final  . GFR calc non Af Amer 01/10/2017 51* >59 mL/min/1.73 Final  . GFR calc Af Amer 01/10/2017 59* >59 mL/min/1.73 Final  . BUN/Creatinine Ratio 01/10/2017 13  9 - 20 Final  . Sodium 01/10/2017 139  134 - 144 mmol/L Final  . Potassium 01/10/2017 4.1  3.5 - 5.2 mmol/L Final  . Chloride 01/10/2017 98  96 - 106 mmol/L Final  . CO2 01/10/2017 27  18 - 29 mmol/L Final  . Calcium 01/10/2017 9.7  8.7 - 10.2 mg/dL Final  . Total Protein 01/10/2017 7.3  6.0 - 8.5 g/dL Final  . Albumin 96/22/2979 4.6  3.5 - 5.5 g/dL Final  . Globulin, Total 01/10/2017 2.7  1.5 - 4.5 g/dL Final  . Albumin/Globulin Ratio 01/10/2017 1.7  1.2 - 2.2 Final  . Bilirubin Total 01/10/2017 0.7  0.0 - 1.2 mg/dL Final  . Alkaline Phosphatase 01/10/2017 68  39 - 117 IU/L Final  . AST 01/10/2017 33  0 - 40 IU/L Final  . ALT 01/10/2017 38  0 - 44 IU/L Final     Imaging: No results found.  =================== Last x-ray of bilateral hands and feet were done 04/11/2016. They were dictated in SRS as follows by Dr. Corliss Skains: June 2017:  BMP is normal.  February 2017:  CBC shows white cell count of 3.7 and comprehensive metabolic panel is normal.  Rapid3 score today was 7.5 which was mostly coming from C-spine pain.  He has no synovitis on examination.  X-rays of the bilateral hands today, 2 views, show bilateral 2nd and 3rd MCP minimal narrowing, bilateral CMC narrowing, PIP and DIP narrowing without any erosive changes.  These findings are consistent with osteoarthritis and rheumatoid arthritis overlap.  His bilateral feet x-rays show bilateral 1st MTP narrowing and PIP and DIP narrowing and left 5th metatarsal pin and bilateral 1st MTP postsurgical changes.  There are bilateral calcaneal spur findings consistent with osteoarthritis.    =====================  Speciality  Comments: No specialty comments available.    Procedures:  No procedures performed Allergies: Latex and Vancomycin   Assessment / Plan:  Visit Diagnoses: Rheumatoid arthritis with rheumatoid factor of multiple sites without organ or systems involvement (HCC) - +CCP  High risk medication use - 02/14/2017: MTX 8/WK --> 6/wk due to abnl kidney fnx; folic acid 2 mg per day - Plan: COMPLETE METABOLIC PANEL WITH GFR, CBC with Differential/Platelet  Primary osteoarthritis of both knees  Primary osteoarthritis of both hands  Primary osteoarthritis of both feet  DJD (degenerative joint disease), cervical - Radiculopathy down right arm starting from neck (after ROM of right shoulder)  Smoker  Elevated serum creatinine - 01/10/2017: Elevated creatinine at 1.52; decreased GFR at 59 // new dose mtx 6/wk starting January 10, 2017.  High risk medication use - Methotrexate 8/week - Plan: COMPLETE METABOLIC PANEL WITH GFR, CBC with Differential/Platelet   Plan: #1: Rheumatoid arthritis with positive CCP. No synovitis on examination. No joint pain, stiffness, swelling.   #2: High risk prescription. Methotrexate 8 per week on Fridays has been changed to 6 per week due to increased creatinine and decreased GFR (see below) Folic acid 2 mg per day Last labs were 01/10/2017 (CBC with differential is within normal limits; CMP with GFR is within normal limits except for elevated creatinine at 1.5 to a decreased GFR at 59).  #3: Obesity: Patient will make efforts to exercise and manage his weight better  #4: DDD of the C-spine: Currently having radiculopathy down right arm. Patient has pain in his neck that radiates from his right shoulder. And according to the patient, it shoots down his right arm. He is seeing Dr. Dion Saucier at [facility] for right shoulder joint discomfort and pain and getting cortisone injections. At this time, patient does not desire surgery for his right shoulder joint. He will  coordinate his care with Dr. Shelba Flake office. If needed, he will see a neurologist which we will coordinate with PCP  #5: OA of bilateral hands, feet, and knees: Ongoing.  #6: Neck pain with radiculopathy down right arm. See above #4 for full details  #7: Elevated creatinine and decreased GFR: We have decreased his methotrexate to 6 per week. I plan to recheck his CMP with GFR in a month. In the meanwhile, he will discuss with his PCP regarding his abnormal kidney function and see if there is any medications that he is taking from his office that may need to be changed to address the abnormal kidney function.  #8: Return to clinic in 5 months  #9: Patient gets his labs through his PCPs office in [place]. I gave him CBC with differential and CMP with GFR lab slip. Depending on the labs in June 2018, patient will need follow-up labs in 2 months or 3 months from June 2018.  #10: Patient does not require refill on methotrexate and folic acid at this time.  Orders: No orders of the defined types were placed in this encounter.  No orders of the defined types were placed in this encounter.   Face-to-face time spent with patient was 30 minutes. 50% of time was spent in counseling and coordination of care.  Follow-Up Instructions: Return in about 5 months (around 07/17/2017) for RA,mtx 6/wk,folic 2/d, neck pain(ddd) w/ radiculopathy right arm,.   Tawni Pummel, PA-C  Note - This record has been created using [employer].  Chart creation errors have been sought, but may not always  have been located. Such creation errors do not reflect on  the standard of medical care.

## 2017-02-04 ENCOUNTER — Other Ambulatory Visit: Payer: Self-pay | Admitting: Rheumatology

## 2017-02-04 NOTE — Telephone Encounter (Signed)
ok 

## 2017-02-04 NOTE — Telephone Encounter (Signed)
Last Visit: 09/11/16 Next Visit: 02/14/17  Okay to refill Folic Acid?

## 2017-02-11 ENCOUNTER — Ambulatory Visit: Payer: Medicare (Managed Care) | Admitting: Rheumatology

## 2017-02-14 ENCOUNTER — Encounter: Payer: Self-pay | Admitting: Rheumatology

## 2017-02-14 ENCOUNTER — Ambulatory Visit (INDEPENDENT_AMBULATORY_CARE_PROVIDER_SITE_OTHER): Payer: Medicare (Managed Care) | Admitting: Rheumatology

## 2017-02-14 VITALS — BP 138/80 | HR 80 | Resp 16 | Ht 74.0 in | Wt 266.0 lb

## 2017-02-14 DIAGNOSIS — M19042 Primary osteoarthritis, left hand: Secondary | ICD-10-CM

## 2017-02-14 DIAGNOSIS — M19072 Primary osteoarthritis, left ankle and foot: Secondary | ICD-10-CM | POA: Diagnosis not present

## 2017-02-14 DIAGNOSIS — M0579 Rheumatoid arthritis with rheumatoid factor of multiple sites without organ or systems involvement: Secondary | ICD-10-CM | POA: Diagnosis not present

## 2017-02-14 DIAGNOSIS — M170 Bilateral primary osteoarthritis of knee: Secondary | ICD-10-CM

## 2017-02-14 DIAGNOSIS — M5030 Other cervical disc degeneration, unspecified cervical region: Secondary | ICD-10-CM

## 2017-02-14 DIAGNOSIS — R7989 Other specified abnormal findings of blood chemistry: Secondary | ICD-10-CM

## 2017-02-14 DIAGNOSIS — M19071 Primary osteoarthritis, right ankle and foot: Secondary | ICD-10-CM | POA: Diagnosis not present

## 2017-02-14 DIAGNOSIS — M19041 Primary osteoarthritis, right hand: Secondary | ICD-10-CM

## 2017-02-14 DIAGNOSIS — Z79899 Other long term (current) drug therapy: Secondary | ICD-10-CM | POA: Diagnosis not present

## 2017-02-14 DIAGNOSIS — F17200 Nicotine dependence, unspecified, uncomplicated: Secondary | ICD-10-CM | POA: Diagnosis not present

## 2017-02-14 DIAGNOSIS — M47812 Spondylosis without myelopathy or radiculopathy, cervical region: Secondary | ICD-10-CM

## 2017-04-09 ENCOUNTER — Other Ambulatory Visit: Payer: Self-pay | Admitting: Rheumatology

## 2017-04-09 NOTE — Telephone Encounter (Signed)
Patient advised to decrease MTX to 5 tabs per week and verbalized understanding.

## 2017-04-09 NOTE — Telephone Encounter (Signed)
Last Visit: 02/14/17 Next Visit: 07/17/17 Labs: 01/10/17 Creat. 1.52 GFR 51 Previous Creat 1.35 GFR 59 Reduced MTX to 6 tabs at that time  Okay to refill MTX?

## 2017-04-09 NOTE — Telephone Encounter (Signed)
Attempted to contact the patient and left message for patient to call the office.  

## 2017-04-09 NOTE — Telephone Encounter (Signed)
Reduce MTX to 5 tabs po q week

## 2017-05-21 ENCOUNTER — Telehealth: Payer: Self-pay | Admitting: *Deleted

## 2017-05-21 NOTE — Telephone Encounter (Signed)
CBC/ CMP 03/07/17  Increase in Creat. 1.5 GFR 59.

## 2017-07-01 ENCOUNTER — Other Ambulatory Visit: Payer: Self-pay | Admitting: Rheumatology

## 2017-07-01 NOTE — Telephone Encounter (Addendum)
Last Visit: 02/14/17 Next Visit: 07/17/17 Labs:03/07/17 Creat. 1.5 GFR 59 01/10/17 Creat. 1.52 GFR 51 Previous Creat 1.35 GFR 59 Stable   Patient states he will update labs through PCP and have copy sent. Updated labs 07/03/17 and awaiting results.  Okay to refill 30 day supply MTX?

## 2017-07-02 ENCOUNTER — Other Ambulatory Visit: Payer: Self-pay | Admitting: *Deleted

## 2017-07-02 ENCOUNTER — Telehealth: Payer: Self-pay | Admitting: Rheumatology

## 2017-07-02 DIAGNOSIS — Z79899 Other long term (current) drug therapy: Secondary | ICD-10-CM

## 2017-07-02 NOTE — Telephone Encounter (Signed)
Werner Lean needs lab orders for patient scheduled for Friday. Please sent fax# [phone] Attn. Marylu Lund.

## 2017-07-02 NOTE — Telephone Encounter (Signed)
Lab Orders released and faxed.  °

## 2017-07-03 ENCOUNTER — Telehealth: Payer: Self-pay | Admitting: Rheumatology

## 2017-07-03 ENCOUNTER — Encounter: Payer: Self-pay | Admitting: Rheumatology

## 2017-07-03 ENCOUNTER — Telehealth (INDEPENDENT_AMBULATORY_CARE_PROVIDER_SITE_OTHER): Payer: Self-pay | Admitting: *Deleted

## 2017-07-03 NOTE — Telephone Encounter (Signed)
Lab orders refaxed

## 2017-07-03 NOTE — Telephone Encounter (Signed)
Patient states his PCP has not received lab orders. Please refax to [phone]

## 2017-07-03 NOTE — Telephone Encounter (Signed)
Received triage call from [name] at [facility] stating she received a fax with orders but was not clear so wanted to know if can email the orders to her. I faxed all CBS and Cmp order to [email or web address].

## 2017-07-03 NOTE — Telephone Encounter (Signed)
Patient at GP office now. Labs have been drawn, and they need orders. Please fax to # [phone]

## 2017-07-03 NOTE — Telephone Encounter (Signed)
Patient advised that the lab orders were faxed multiple times.

## 2017-07-03 NOTE — Telephone Encounter (Signed)
ok 

## 2017-07-06 NOTE — Progress Notes (Signed)
Office Visit Note  Patient: Dylan Dudley             Date of Birth: 1961/08/01           MRN: [id number]             PCP: Elise Benne, MD Referring: Elise Benne, MD Visit Date: 07/17/2017 Occupation: @GUAROCC @    Subjective:  Increased pain in joints.   History of Present Illness: Dylan Dudley is a 56 y.o. male with history of sero positive rheumatoid arthritis. His dose of methotrexate was reduced to 5 tablets per week after elevation of creatinine. He states he's been experiencing this decreases stiffness in his bilateral hands. He also has been experiencing some pain in his neck and his left knee. He denies any joint swelling. He had repeat surgery on his right rotator cuff 06/13/2017. He is recovering from that.  Activities of Daily Living:  Patient reports morning stiffness for 15 minutes.   Patient Reports nocturnal pain.  Difficulty dressing/grooming: Denies Difficulty climbing stairs: Reports Difficulty getting out of chair: Reports Difficulty using hands for taps, buttons, cutlery, and/or writing: Denies   Review of Systems  Constitutional: Positive for fatigue. Negative for night sweats and weakness ( ).  HENT: Negative.  Negative for mouth sores, mouth dryness and nose dryness.   Eyes: Negative.  Negative for redness and dryness.  Respiratory: Negative.  Negative for shortness of breath and difficulty breathing.   Cardiovascular: Positive for hypertension. Negative for chest pain, palpitations, irregular heartbeat and swelling in legs/feet.  Gastrointestinal: Negative.  Negative for constipation and diarrhea.  Endocrine: Negative for increased urination.  Musculoskeletal: Positive for arthralgias, joint pain, joint swelling and morning stiffness. Negative for myalgias, muscle weakness, muscle tenderness and myalgias.  Skin: Negative.  Negative for color change, rash, hair loss, nodules/bumps, skin tightness, ulcers and sensitivity to  sunlight.  Allergic/Immunologic: Negative for susceptible to infections.  Neurological: Negative for dizziness, fainting, memory loss and night sweats.  Hematological: Negative for swollen glands.  Psychiatric/Behavioral: Positive for depressed mood and sleep disturbance. The patient is not nervous/anxious.     PMFS History:  Patient Active Problem List   Diagnosis Date Noted  . Rheumatoid arthritis with rheumatoid factor of multiple sites without organ or systems involvement (HCC) 09/10/2016  . Primary osteoarthritis of both hands 09/10/2016  . Primary osteoarthritis of both knees 09/10/2016  . Primary osteoarthritis of both feet 09/10/2016  . DJD (degenerative joint disease), cervical 09/10/2016  . History of tremor 09/10/2016  . History of sleep apnea 09/10/2016  . Coronary artery disease involving native heart 09/10/2016  . Smoker 09/10/2016  . High risk medication use 09/10/2016  . Tear of right rotator cuff 03/14/2012  . Impingement syndrome of right shoulder 03/14/2012  . AC (acromioclavicular) arthritis, right 03/14/2012  . Disorder of tendon of biceps 03/14/2012    Past Medical History:  Diagnosis Date  . AC (acromioclavicular) arthritis 03/14/2012  . Anxiety   . Arthritis    "shoulders; knees" (07/17/2013)  . DDD (degenerative disc disease)   . Depression   . Disorder of tendon of biceps 03/14/2012  . GERD (gastroesophageal reflux disease)   . Hyperlipemia   . Hypertension   . Impingement syndrome of right shoulder 03/14/2012  . Pneumonia 2012   "once" (07/17/2013)  . Seasonal allergies   . Sleep apnea    "did test 2 months ago; get results in December" (07/17/2013)  . Tear of right rotator cuff 03/14/2012  Family History  Problem Relation Age of Onset  . Breast cancer Mother   . Hypertension Mother   . Hypertension Father   . Multiple sclerosis Sister    Past Surgical History:  Procedure Laterality Date  . ANTERIOR CERVICAL DECOMP/DISCECTOMY FUSION   08/2011   [place]  . BUNIONECTOMY Bilateral 1998  . DEBRIDEMENT AND CLOSURE WOUND  12/2012   "back of my neck" (07/17/2013)  . DEBRIDEMENT AND CLOSURE WOUND N/A 07/17/2013   Procedure: DEBRIDEMENT  OF SKIN, SUBCUTANEOUS TISSUE BONE, MUSCLE FLAP, AND CLOSURE WOUND;  Surgeon: Glenna Fellows, MD;  Location: [facility];  Service: Plastics;  Laterality: N/A;  . EXCISIONAL HEMORRHOIDECTOMY  ~ 1996  . INCISION AND DRAINAGE OF WOUND  10/2012 X 2   "back of my neck" (07/17/2013)  . MUSCLE FLAP CLOSURE  07/17/2013   "to back of my neck" (07/17/2013)  . NECK SURGERY  09/2012   "spur removed from back of my neck" (07/17/2013)  . SHOULDER ARTHROSCOPY Left 2008  . SHOULDER ARTHROSCOPY W/ ROTATOR CUFF REPAIR Right 2013   "and bicep repair" (07/17/2013)  . TONSILLECTOMY  ~ 56   Social History   Social History Narrative  . No narrative on file     Objective: Vital Signs: BP 129/86 (BP Location: Left Arm, Patient Position: Sitting, Cuff Size: Normal)   Pulse 69   Ht 6\' 2"  (1.88 m)   Wt 270 lb (122.5 kg)   BMI 34.67 kg/m    Physical Exam  Constitutional: He is oriented to person, place, and time. He appears well-developed and well-nourished.  HENT:  Head: Normocephalic and atraumatic.  Eyes: Pupils are equal, round, and reactive to light. Conjunctivae and EOM are normal.  Neck: Normal range of motion. Neck supple.  Cardiovascular: Normal rate, regular rhythm and normal heart sounds.   Pulmonary/Chest: Effort normal and breath sounds normal.  Abdominal: Soft. Bowel sounds are normal.  Neurological: He is alert and oriented to person, place, and time.  Skin: Skin is warm and dry. Capillary refill takes less than 2 seconds.  Psychiatric: He has a normal mood and affect. His behavior is normal.  Nursing note and vitals reviewed.    Musculoskeletal Exam: C-spine limited range of motion with some discomfort. His right arm was in a sling after rotator cuff tear surgery. Left shoulder joint was  good range of motion of the joint was good range of motion he had no synovitis over bilateral wrist joints MCPs PIPs of her DIP joints. He good range of motion of his bilateral hip joints and bilateral knee joints. His some crepitus and discomfort range of motion of his knee joints without any warmth swelling or effusion.  CDAI Exam: CDAI Homunculus Exam:   Tenderness:  LLE: tibiofemoral  Joint Counts:  CDAI Tender Joint count: 1 CDAI Swollen Joint count: 0  Global Assessments:  Patient Global Assessment: 6 Provider Global Assessment: 2  CDAI Calculated Score: 9    Investigation: Findings:  07/03/2017 CBC normal, CMP creatinine 1.54, GFR 57  CBC Latest Ref Rng & Units 01/10/2017 09/11/2016 07/16/2013  WBC 3.4 - 10.8 x10E3/uL 4.0 4.4 4.9  Hemoglobin 13.0 - 17.7 g/dL 07/18/2013 41.2 87.8  Hematocrit 37.5 - 51.0 % 41.3 41.9 39.7  Platelets 150 - 379 x10E3/uL 301 266 295   CMP Latest Ref Rng & Units 01/10/2017 09/11/2016 07/16/2013  Glucose 65 - 99 mg/dL 07/18/2013) 720(N) 470(J)  BUN 6 - 24 mg/dL 20 18 21   Creatinine 0.76 - 1.27 mg/dL 628(Z) ) 6.62(H)  Sodium 134 - 144 mmol/L 139 136 138  Potassium 3.5 - 5.2 mmol/L 4.1 4.5 4.1  Chloride 96 - 106 mmol/L 98 101 98  CO2 18 - 29 mmol/L 27 28 29   Calcium 8.7 - 10.2 mg/dL 9.7 9.4 9.4  Total Protein 6.0 - 8.5 g/dL 7.3 7.3 -  Total Bilirubin 0.0 - 1.2 mg/dL 0.7 0.7 -  Alkaline Phos 39 - 117 IU/L 68 52 -  AST 0 - 40 IU/L 33 25 -  ALT 0 - 44 IU/L 38 29 -    Imaging: No results found.  Speciality Comments: No specialty comments available.    Procedures:  No procedures performed Allergies: Latex and Vancomycin   Assessment / Plan:     Visit Diagnoses: Rheumatoid arthritis involving multiple sites, unspecified rheumatoid factor presence (HCC) - positive anti-CCP antibody.He complains of arthralgias but has no synovitis on examination today. His stools of methotrexate was reduced to 5 tablets per week and April due to elevation of  creatinine.We requested labs from his PCPs office and his creatinine is stable. We will continue him on current dose for right now as he does not have any active synovitis.  High risk medication use - Methotrexate 5/week, folic acid 2 mg by mouth daily. Patient reports having labs done at his PCPs office last week. We will obtain the labs. I will continue to check his labs every 3 months which he wants to do through his PCPs office in [place].   Primary osteoarthritis of both hands: He has some discomfort and stiffness in his hands.  Impingement syndrome of right shoulder - status post right rotator cuff tear repair by Dr. [place] in September 2018.  Primary osteoarthritis of both knees: He's been having pain and discomfort in his left knee joint but no warmth swelling or effusion was noted. I believe that his symptoms are related to osteoarthritis. Weight loss diet and exercise was discussed.  Primary osteoarthritis of both feet: Proper fitting shoes were discussed.  DDD (degenerative disc disease), cervical - He has limited range of motion  Other medical problems are listed as follows:  Coronary artery disease  History of sleep apnea  History of depression  History of tremor  Smoker    Orders: No orders of the defined types were placed in this encounter.  No orders of the defined types were placed in this encounter.  Follow-Up Instructions: Return in about 5 months (around 12/15/2017) for Rheumatoid arthritis.   12/17/2017, MD  Note - This record has been created using Pollyann Savoy.  Chart creation errors have been sought, but may not always  have been located. Such creation errors do not reflect on  the standard of medical care.

## 2017-07-17 ENCOUNTER — Ambulatory Visit (INDEPENDENT_AMBULATORY_CARE_PROVIDER_SITE_OTHER): Payer: Medicare (Managed Care) | Admitting: Rheumatology

## 2017-07-17 ENCOUNTER — Encounter: Payer: Self-pay | Admitting: Rheumatology

## 2017-07-17 ENCOUNTER — Ambulatory Visit: Payer: Medicare (Managed Care) | Admitting: Rheumatology

## 2017-07-17 VITALS — BP 129/86 | HR 69 | Ht 74.0 in | Wt 270.0 lb

## 2017-07-17 DIAGNOSIS — M19041 Primary osteoarthritis, right hand: Secondary | ICD-10-CM | POA: Diagnosis not present

## 2017-07-17 DIAGNOSIS — Z8669 Personal history of other diseases of the nervous system and sense organs: Secondary | ICD-10-CM | POA: Diagnosis not present

## 2017-07-17 DIAGNOSIS — M069 Rheumatoid arthritis, unspecified: Secondary | ICD-10-CM

## 2017-07-17 DIAGNOSIS — M19042 Primary osteoarthritis, left hand: Secondary | ICD-10-CM | POA: Diagnosis not present

## 2017-07-17 DIAGNOSIS — Z79899 Other long term (current) drug therapy: Secondary | ICD-10-CM | POA: Diagnosis not present

## 2017-07-17 DIAGNOSIS — M170 Bilateral primary osteoarthritis of knee: Secondary | ICD-10-CM

## 2017-07-17 DIAGNOSIS — M7541 Impingement syndrome of right shoulder: Secondary | ICD-10-CM | POA: Diagnosis not present

## 2017-07-17 DIAGNOSIS — F17200 Nicotine dependence, unspecified, uncomplicated: Secondary | ICD-10-CM | POA: Diagnosis not present

## 2017-07-17 DIAGNOSIS — M5030 Other cervical disc degeneration, unspecified cervical region: Secondary | ICD-10-CM

## 2017-07-17 DIAGNOSIS — I2510 Atherosclerotic heart disease of native coronary artery without angina pectoris: Secondary | ICD-10-CM | POA: Diagnosis not present

## 2017-07-17 DIAGNOSIS — M19071 Primary osteoarthritis, right ankle and foot: Secondary | ICD-10-CM | POA: Diagnosis not present

## 2017-07-17 DIAGNOSIS — M19072 Primary osteoarthritis, left ankle and foot: Secondary | ICD-10-CM

## 2017-07-17 NOTE — Patient Instructions (Signed)
Standing Labs We placed an order today for your standing lab work.    Please come back and get your standing labs in January and every 3 months  We have open lab Monday through Friday from 8:30-11:30 AM and 1:30-4 PM at the office of Dr. Merit Gadsby.   The office is located at [address], [address], [place], [place] [address] No appointment is necessary.   Labs are drawn by [facility].  You may receive a bill from [facility] for your lab work. If you have any questions regarding directions or hours of operation,  please call [phone].    

## 2017-08-04 ENCOUNTER — Other Ambulatory Visit: Payer: Self-pay | Admitting: Rheumatology

## 2017-08-05 NOTE — Telephone Encounter (Signed)
Last Visit: 07/17/17 Next Visit 12/18/17 Labs: 07/03/17 stable  Okay to refill per Dr. Corliss Skains

## 2017-09-19 ENCOUNTER — Telehealth: Payer: Self-pay | Admitting: Rheumatology

## 2017-09-19 NOTE — Telephone Encounter (Signed)
Patient left a message stating that he did lab work at his PCP office today because he had a physical.  It was hard to tell what exactly he needed, but I think he was his results or order sent to his PCP.  CB#[phone].  Thank you.

## 2017-09-19 NOTE — Telephone Encounter (Signed)
Spoke to patient and patient will have labs faxed to our office from visit today with his PCP. Provided patient with fax number.

## 2017-11-01 ENCOUNTER — Other Ambulatory Visit: Payer: Self-pay | Admitting: Rheumatology

## 2017-11-01 NOTE — Telephone Encounter (Signed)
Last Visit: 07/17/17 Next Visit 12/18/17  Okay to refill per Dr. Corliss Skains

## 2017-11-12 ENCOUNTER — Other Ambulatory Visit: Payer: Self-pay | Admitting: Rheumatology

## 2017-11-12 NOTE — Telephone Encounter (Signed)
Last Visit: 07/17/17 Next Visit 12/18/17 Labs: 09/19/17 stable  Okay to refill per Dr. Corliss Skains

## 2017-12-04 NOTE — Progress Notes (Signed)
Office Visit Note  Patient: Dylan Dudley             Date of Birth: 09/16/1961           MRN: [id number]             PCP: Tobe Sos, MD Referring: Tobe Sos, MD Visit Date: 12/18/2017 Occupation: '@GUAROCC'$ @    Subjective:  Other (increased joint pain, left knee and BIL hands )   History of Present Illness: Dylan Dudley is a 57 y.o. male with history of seropositive rheumatoid arthritis.  He states he continues to have pain and discomfort in his both hands and left knee joint.  He feels tightness and swelling in his hands.  He has some stiffness in his C-spine.  None of the other joints are painful.  He continues to have problems with his right shoulder where he had rotator cuff tear repair.  Activities of Daily Living:  Patient reports morning stiffness for 15 minutes.   Patient Denies nocturnal pain.  Difficulty dressing/grooming: Denies Difficulty climbing stairs: Reports Difficulty getting out of chair: Denies Difficulty using hands for taps, buttons, cutlery, and/or writing: Denies   Review of Systems  Constitutional: Positive for fatigue. Negative for night sweats.  HENT: Negative for mouth sores, mouth dryness and nose dryness.   Eyes: Negative for redness and dryness.  Respiratory: Negative for shortness of breath and difficulty breathing.   Cardiovascular: Negative for chest pain, palpitations, hypertension, irregular heartbeat and swelling in legs/feet.  Gastrointestinal: Negative for constipation and diarrhea.  Endocrine: Negative for increased urination.  Musculoskeletal: Positive for arthralgias, joint pain, joint swelling and morning stiffness. Negative for myalgias, muscle weakness, muscle tenderness and myalgias.  Skin: Negative for color change, rash, hair loss, nodules/bumps, skin tightness, ulcers and sensitivity to sunlight.  Allergic/Immunologic: Negative for susceptible to infections.  Neurological: Negative for dizziness,  fainting, memory loss, night sweats and weakness ( ).  Hematological: Negative for swollen glands.  Psychiatric/Behavioral: Positive for sleep disturbance. Negative for depressed mood. The patient is not nervous/anxious.        On CPAP    PMFS History:  Patient Active Problem List   Diagnosis Date Noted  . Rheumatoid arthritis with rheumatoid factor of multiple sites without organ or systems involvement ([place]) 09/10/2016  . Primary osteoarthritis of both hands 09/10/2016  . Primary osteoarthritis of both knees 09/10/2016  . Primary osteoarthritis of both feet 09/10/2016  . DJD (degenerative joint disease), cervical 09/10/2016  . History of tremor 09/10/2016  . History of sleep apnea 09/10/2016  . Coronary artery disease involving native heart 09/10/2016  . Smoker 09/10/2016  . High risk medication use 09/10/2016  . Tear of right rotator cuff 03/14/2012  . Impingement syndrome of right shoulder 03/14/2012  . AC (acromioclavicular) arthritis, right 03/14/2012  . Disorder of tendon of biceps 03/14/2012    Past Medical History:  Diagnosis Date  . AC (acromioclavicular) arthritis 03/14/2012  . Anxiety   . Arthritis    "shoulders; knees" (07/17/2013)  . DDD (degenerative disc disease)   . Depression   . Disorder of tendon of biceps 03/14/2012  . GERD (gastroesophageal reflux disease)   . Hyperlipemia   . Hypertension   . Impingement syndrome of right shoulder 03/14/2012  . Pneumonia 2012   "once" (07/17/2013)  . Seasonal allergies   . Sleep apnea    "did test 2 months ago; get results in December" (07/17/2013)  . Tear of right rotator cuff 03/14/2012  Family History  Problem Relation Age of Onset  . Breast cancer Mother   . Hypertension Mother   . Hypertension Father   . Multiple sclerosis Sister    Past Surgical History:  Procedure Laterality Date  . ANTERIOR CERVICAL DECOMP/DISCECTOMY FUSION  08/2011   [place]  . BUNIONECTOMY Bilateral 1998  . DEBRIDEMENT AND  CLOSURE WOUND  12/2012   "back of my neck" (07/17/2013)  . DEBRIDEMENT AND CLOSURE WOUND N/A 07/17/2013   Procedure: DEBRIDEMENT  OF SKIN, SUBCUTANEOUS TISSUE BONE, MUSCLE FLAP, AND CLOSURE WOUND;  Surgeon: Irene Limbo, MD;  Location: [facility];  Service: Plastics;  Laterality: N/A;  . EXCISIONAL HEMORRHOIDECTOMY  ~ 1996  . INCISION AND DRAINAGE OF WOUND  10/2012 X 2   "back of my neck" (07/17/2013)  . MUSCLE FLAP CLOSURE  07/17/2013   "to back of my neck" (07/17/2013)  . NECK SURGERY  09/2012   "spur removed from back of my neck" (07/17/2013)  . SHOULDER ARTHROSCOPY Left 2008  . SHOULDER ARTHROSCOPY W/ ROTATOR CUFF REPAIR Right 2013   "and bicep repair" (07/17/2013)  . TONSILLECTOMY  ~ 12   Social History   Social History Narrative  . Not on file     Objective: Vital Signs: BP 126/82 (BP Location: Left Arm, Patient Position: Sitting, Cuff Size: Large)   Pulse 63   Resp 14   Ht '6\' 2"'$  (1.88 m)   Wt 264 lb 8 oz (120 kg)   BMI 33.96 kg/m    Physical Exam  Constitutional: He is oriented to person, place, and time. He appears well-developed and well-nourished.  HENT:  Head: Normocephalic and atraumatic.  Eyes: Pupils are equal, round, and reactive to light. Conjunctivae and EOM are normal.  Neck: Normal range of motion. Neck supple.  Cardiovascular: Normal rate, regular rhythm and normal heart sounds.  Pulmonary/Chest: Effort normal and breath sounds normal.  Abdominal: Soft. Bowel sounds are normal.  Neurological: He is alert and oriented to person, place, and time.  Skin: Skin is warm and dry. Capillary refill takes less than 2 seconds.  Psychiatric: He has a normal mood and affect. His behavior is normal.  Nursing note and vitals reviewed.    Musculoskeletal Exam: He has a stiffness and limited range of motion of his C-spine.  Thoracic lumbar spine good range of motion without discomfort.  He with full range of motion of his bilateral shoulders with no discomfort in his  right shoulder today.  Elbow joints wrist joint MCPs PIPs DIPs were in good range of motion with no synovitis.  He has thickening of PIP/DIP joints in his hands and feet consistent with osteoarthritis.  He has some warmth on palpation of his left knee joint.  All the joints of full range of motion with no synovitis.  CDAI Exam: CDAI Homunculus Exam:   Tenderness:  LLE: tibiofemoral  Joint Counts:  CDAI Tender Joint count: 1 CDAI Swollen Joint count: 0  Global Assessments:  Patient Global Assessment: 4 Provider Global Assessment: 1  CDAI Calculated Score: 6    Investigation: No additional findings. Labs: 09/19/2017 stable AST 60, Cr 1.4 normal, ESR 14 Imaging: Xr Knee 3 View Left  Result Date: 12/18/2017 Severe medial compartment narrowing with anterior condylar and lateral osteophytes was noted.  No chondrocalcinosis was noted.  Severe patellofemoral narrowing with spurring was noted. Impression: These findings are consistent with severe osteoarthritis and severe chondromalacia patella.   Speciality Comments: No specialty comments available.    Procedures:  Large Joint  Inj: L knee on 12/18/2017 8:30 AM Indications: pain Details: 27 G 1.5 in needle, medial approach  Arthrogram: No  Medications: 1.5 mL lidocaine (PF) 1 %; 40 mg triamcinolone acetonide 40 MG/ML Aspirate: 0 mL Outcome: tolerated well, no immediate complications Procedure, treatment alternatives, risks and benefits explained, specific risks discussed. Consent was given by the patient. Immediately prior to procedure a time out was called to verify the correct patient, procedure, equipment, support staff and site/side marked as required. Patient was prepped and draped in the usual sterile fashion.     Allergies: Latex and Vancomycin   Assessment / Plan:     Visit Diagnoses: Rheumatoid arthritis with rheumatoid factor of multiple sites without organ or systems involvement (HCC) - positive anti-CCP antibody.   He complains of increasing stiffness and intermittent swelling in his hands.  He has no synovitis on examination.  I believe most of his symptoms are coming from underlying osteoarthritis.  His rheumatoid arthritis seems to be quite well controlled.  High risk medication use - Methotrexate 5/week, folic acid 2 mg by mouth daily.  His LFTs have been mildly elevated but is stable.  He is also on a statin drug which contributes to his elevated LFTs.  Primary osteoarthritis of both hands: Chronic pain and discomfort.  Joint protection muscle strengthening discussed.  Chronic pain in left knee joint: He had warmth and swelling on examination today.  After informed consent was obtained and different treatment options were discussed left knee joint was injected with cortisone as described above.  He tolerated the procedure well.  I also obtain x-ray of his left knee joint 2 views today.  X-rays showed moderate to severe osteoarthritis and severe chondromalacia patella.  Weight loss diet and exercise was discussed.  We also discussed the option of total knee replacement on Visco supplement injections.  Primary osteoarthritis of both knees: He has chronic pain in his bilateral knee joints.  Primary osteoarthritis of both feet: He does not have much discomfort in his feet currently.  Impingement syndrome of right shoulder - status post right rotator cuff tear repair by Dr. Mardelle Matte in September 2018.  He describes chronic pain but had good range of motion in his shoulder.  DDD (degenerative disc disease), cervical: He has limited range of motion of his C-spine with stiffness.  Other medical problems are listed as follows:  Smoker: Association of smoking with rheumatoid arthritis was discussed.  Smoking was discouraged.  History of coronary artery disease  History of tremor  History of sleep apnea  History of depression   Association of heart disease with rheumatoid arthritis was discussed. Need to  monitor blood pressure, cholesterol, and to exercise 30-60 minutes on daily basis was discussed. Poor dental hygiene can be a predisposing factor for rheumatoid arthritis. Good dental hygiene was discussed.   Orders: Orders Placed This Encounter  Procedures  . Large Joint Inj  . XR KNEE 3 VIEW LEFT  . CBC with Differential/Platelet  . COMPLETE METABOLIC PANEL WITH GFR  . Cyclic citrul peptide antibody, IgG   No orders of the defined types were placed in this encounter.   Face-to-face time spent with patient was 30 minutes.  Greater than 50% of time was spent in counseling and coordination of care.  Follow-Up Instructions: Return in about 5 months (around 05/20/2018) for Rheumatoid arthritis,OA.   Bo Merino, MD  Note - This record has been created using Editor, commissioning.  Chart creation errors have been sought, but may not always  have been located. Such creation errors do not reflect on  the standard of medical care.

## 2017-12-18 ENCOUNTER — Ambulatory Visit (INDEPENDENT_AMBULATORY_CARE_PROVIDER_SITE_OTHER): Payer: Medicare (Managed Care)

## 2017-12-18 ENCOUNTER — Ambulatory Visit (INDEPENDENT_AMBULATORY_CARE_PROVIDER_SITE_OTHER): Payer: Medicare (Managed Care) | Admitting: Rheumatology

## 2017-12-18 ENCOUNTER — Encounter: Payer: Self-pay | Admitting: Rheumatology

## 2017-12-18 VITALS — BP 126/82 | HR 63 | Resp 14 | Ht 74.0 in | Wt 264.5 lb

## 2017-12-18 DIAGNOSIS — M25562 Pain in left knee: Secondary | ICD-10-CM

## 2017-12-18 DIAGNOSIS — F17200 Nicotine dependence, unspecified, uncomplicated: Secondary | ICD-10-CM | POA: Diagnosis not present

## 2017-12-18 DIAGNOSIS — M19041 Primary osteoarthritis, right hand: Secondary | ICD-10-CM

## 2017-12-18 DIAGNOSIS — M5030 Other cervical disc degeneration, unspecified cervical region: Secondary | ICD-10-CM | POA: Diagnosis not present

## 2017-12-18 DIAGNOSIS — Z8659 Personal history of other mental and behavioral disorders: Secondary | ICD-10-CM

## 2017-12-18 DIAGNOSIS — Z8669 Personal history of other diseases of the nervous system and sense organs: Secondary | ICD-10-CM | POA: Diagnosis not present

## 2017-12-18 DIAGNOSIS — M19071 Primary osteoarthritis, right ankle and foot: Secondary | ICD-10-CM

## 2017-12-18 DIAGNOSIS — Z79899 Other long term (current) drug therapy: Secondary | ICD-10-CM

## 2017-12-18 DIAGNOSIS — M7541 Impingement syndrome of right shoulder: Secondary | ICD-10-CM

## 2017-12-18 DIAGNOSIS — G8929 Other chronic pain: Secondary | ICD-10-CM | POA: Diagnosis not present

## 2017-12-18 DIAGNOSIS — M0579 Rheumatoid arthritis with rheumatoid factor of multiple sites without organ or systems involvement: Secondary | ICD-10-CM

## 2017-12-18 DIAGNOSIS — Z8679 Personal history of other diseases of the circulatory system: Secondary | ICD-10-CM | POA: Diagnosis not present

## 2017-12-18 DIAGNOSIS — M19072 Primary osteoarthritis, left ankle and foot: Secondary | ICD-10-CM

## 2017-12-18 DIAGNOSIS — M170 Bilateral primary osteoarthritis of knee: Secondary | ICD-10-CM

## 2017-12-18 DIAGNOSIS — M19042 Primary osteoarthritis, left hand: Secondary | ICD-10-CM

## 2017-12-18 MED ORDER — TRIAMCINOLONE ACETONIDE 40 MG/ML IJ SUSP
40.0000 mg | INTRAMUSCULAR | Status: AC | PRN
  Administered 2017-12-18: 40 mg via INTRA_ARTICULAR

## 2017-12-18 MED ORDER — LIDOCAINE HCL (PF) 1 % IJ SOLN
1.5000 mL | INTRAMUSCULAR | Status: AC | PRN
  Administered 2017-12-18: 1.5 mL

## 2017-12-18 NOTE — Patient Instructions (Addendum)
Standing Labs We placed an order today for your standing lab work.    Please come back and get your standing labs in June and every 3 months  We have open lab Monday through Friday from 8:30-11:30 AM and 1:30-4:00 PM  at the office of Dr. Pollyann Savoy.   You may experience shorter wait times on Monday and Friday afternoons. The office is located at [address], [address], [place], [place] [address] No appointment is necessary.   Labs are drawn by [facility].  You may receive a bill from [facility] for your lab work. If you have any questions regarding directions or hours of operation,  please call [phone].     Association of heart disease with rheumatoid arthritis was discussed. Need to monitor blood pressure, cholesterol, and to exercise 30-60 minutes on daily basis was discussed. Poor dental hygiene can be a predisposing factor for rheumatoid arthritis. Good dental hygiene was discussed.  Smoking cessation

## 2017-12-19 LAB — COMPLETE METABOLIC PANEL WITH GFR
AG Ratio: 1.6 (calc) (ref 1.0–2.5)
ALBUMIN MSPROF: 4.2 g/dL (ref 3.6–5.1)
ALT: 23 U/L (ref 9–46)
AST: 23 U/L (ref 10–35)
Alkaline phosphatase (APISO): 63 U/L (ref 40–115)
BUN/Creatinine Ratio: 17 (calc) (ref 6–22)
BUN: 24 mg/dL (ref 7–25)
CALCIUM: 9.5 mg/dL (ref 8.6–10.3)
CO2: 28 mmol/L (ref 20–32)
CREATININE: 1.43 mg/dL — AB (ref 0.70–1.33)
Chloride: 101 mmol/L (ref 98–110)
GFR, EST NON AFRICAN AMERICAN: 54 mL/min/{1.73_m2} — AB (ref >=60)
GFR, Est African American: 63 mL/min/{1.73_m2} (ref >=60)
GLOBULIN: 2.7 g/dL (ref 1.9–3.7)
Glucose, Bld: 101 mg/dL — ABNORMAL HIGH (ref 65–99)
Potassium: 4.5 mmol/L (ref 3.5–5.3)
SODIUM: 136 mmol/L (ref 135–146)
Total Bilirubin: 0.5 mg/dL (ref 0.2–1.2)
Total Protein: 6.9 g/dL (ref 6.1–8.1)

## 2017-12-19 LAB — CBC WITH DIFFERENTIAL/PLATELET
BASOS PCT: 0.7 %
Basophils Absolute: 29 cells/uL (ref 0–200)
EOS ABS: 109 {cells}/uL (ref 15–500)
EOS PCT: 2.6 %
HCT: 38.1 % — ABNORMAL LOW (ref 38.5–50.0)
HEMOGLOBIN: 13 g/dL — AB (ref 13.2–17.1)
Lymphs Abs: 928 cells/uL (ref 850–3900)
MCH: 32.5 pg (ref 27.0–33.0)
MCHC: 34.1 g/dL (ref 32.0–36.0)
MCV: 95.3 fL (ref 80.0–100.0)
MONOS PCT: 8.1 %
MPV: 10 fL (ref 7.5–12.5)
NEUTROS ABS: 2793 {cells}/uL (ref 1500–7800)
Neutrophils Relative %: 66.5 %
Platelets: 243 10*3/uL (ref 140–400)
RBC: 4 10*6/uL — AB (ref 4.20–5.80)
RDW: 13.6 % (ref 11.0–15.0)
Total Lymphocyte: 22.1 %
WBC mixed population: 340 cells/uL (ref 200–950)
WBC: 4.2 10*3/uL (ref 3.8–10.8)

## 2017-12-19 LAB — CYCLIC CITRUL PEPTIDE ANTIBODY, IGG: Cyclic Citrullin Peptide Ab: >250 UNITS — ABNORMAL HIGH

## 2017-12-19 NOTE — Progress Notes (Signed)
His CCP is a still quite elevated.  We should schedule ultrasound of his bilateral hands to rule out synovitis.

## 2018-01-08 ENCOUNTER — Ambulatory Visit (INDEPENDENT_AMBULATORY_CARE_PROVIDER_SITE_OTHER): Payer: Medicare (Managed Care) | Admitting: Rheumatology

## 2018-01-08 ENCOUNTER — Ambulatory Visit (INDEPENDENT_AMBULATORY_CARE_PROVIDER_SITE_OTHER): Payer: Self-pay

## 2018-01-08 DIAGNOSIS — M0579 Rheumatoid arthritis with rheumatoid factor of multiple sites without organ or systems involvement: Secondary | ICD-10-CM

## 2018-01-08 DIAGNOSIS — M79641 Pain in right hand: Secondary | ICD-10-CM

## 2018-01-08 DIAGNOSIS — M25541 Pain in joints of right hand: Secondary | ICD-10-CM

## 2018-01-08 DIAGNOSIS — M25542 Pain in joints of left hand: Secondary | ICD-10-CM

## 2018-01-08 DIAGNOSIS — M79642 Pain in left hand: Secondary | ICD-10-CM

## 2018-01-28 ENCOUNTER — Other Ambulatory Visit: Payer: Self-pay | Admitting: Rheumatology

## 2018-01-28 NOTE — Telephone Encounter (Signed)
Last visit: 12/18/17 Next visit: 05/16/18 Labs: 12/18/17 cbc/cmp stable  Okay to refill per Dr. Corliss Skains

## 2018-05-03 ENCOUNTER — Other Ambulatory Visit: Payer: Self-pay | Admitting: Rheumatology

## 2018-05-05 NOTE — Telephone Encounter (Signed)
Last visit: 12/18/2017 Next visit: 05/16/2018 Labs: 12/18/2017   Advised patient he is due to update labs, patient verbalized understanding and states he will go to his PCP and have the results faxed to our office.   Okay to refill 30 day supply, per Dr. Corliss Skains.

## 2018-05-07 ENCOUNTER — Telehealth: Payer: Self-pay | Admitting: Rheumatology

## 2018-05-07 DIAGNOSIS — Z79899 Other long term (current) drug therapy: Secondary | ICD-10-CM

## 2018-05-07 NOTE — Telephone Encounter (Signed)
Patient called requesting his labwork orders be sent to Dr. Vernell Leep at [facility] in [place], [place].  Patient states he has an appointment today and needs the orders faxed to [phone]

## 2018-05-07 NOTE — Telephone Encounter (Signed)
Called patient and patient states the office uses [facility]. I have faxed orders to number provided. Patient has been notified.

## 2018-05-09 ENCOUNTER — Telehealth: Payer: Self-pay

## 2018-05-09 NOTE — Telephone Encounter (Signed)
Patient returned the call and was advised of lab results and recommendations. Patient verbalized understanding.

## 2018-05-09 NOTE — Telephone Encounter (Signed)
Lab results received via fax.   05/07/2018 Creatinine 1.56  previously 1.43 GFR 56 previously 63 Elevated glucose.   Advise patient to decrease MTX to 4 tabs weekly, per Dr. Corliss Skains.

## 2018-05-12 NOTE — Progress Notes (Signed)
Office Visit Note  Patient: Dylan Dudley             Date of Birth: 1961/01/25           MRN: [id number]             PCP: Elise Benne, MD Referring: Elise Benne, MD Visit Date: 05/16/2018 Occupation: @GUAROCC @  Subjective:  Left knee pain   History of Present Illness: Dylan Dudley is a 57 y.o. male with history of seropositive rheumatoid arthritis, osteoarthritis, and DDD. He is taking MTX 4 tablets po once weekly and folic acid 1 mg daily.  Patient presents today with left knee pain.  He states the pain is most severe if he is walking for long distances or climbing up steps.  He states that he walks 3 miles daily.  He states he continues to have chronic lower back pain and stiffness.  He states he is also having neck pain and left-sided radiculopathy.  He states he does not lift heavy objects very often but is noticed more stiffness in his neck.  [facility].  He denies any other joint pain or joint swelling at this time.  He denies any recent rheumatoid arthritis flares.  He has not noticed any difference since decreasing his dose of methotrexate from 5 tablets once weekly to 4 tablets once weekly.  Activities of Daily Living:  Patient reports morning stiffness for 10 minutes.   Patient Reports nocturnal pain.  Difficulty dressing/grooming: Denies Difficulty climbing stairs: Denies Difficulty getting out of chair: Reports Difficulty using hands for taps, buttons, cutlery, and/or writing: Denies  Review of Systems  Constitutional: Negative for fatigue and night sweats.  HENT: Negative for mouth sores, trouble swallowing, trouble swallowing, mouth dryness and nose dryness.   Eyes: Negative for pain, redness and dryness.  Respiratory: Negative for cough, hemoptysis, shortness of breath and difficulty breathing.   Cardiovascular: Negative for chest pain, palpitations, hypertension,  irregular heartbeat and swelling in legs/feet.  Gastrointestinal: Negative for abdominal pain, constipation, diarrhea, nausea and vomiting.  Endocrine: Negative for increased urination.  Genitourinary: Negative for painful urination and pelvic pain.  Musculoskeletal: Positive for morning stiffness. Negative for arthralgias, joint pain, joint swelling, myalgias, muscle weakness, muscle tenderness and myalgias.  Skin: Positive for rash. Negative for color change, hair loss, nodules/bumps, skin tightness, ulcers and sensitivity to sunlight.  Allergic/Immunologic: Negative for susceptible to infections.  Neurological: Negative for dizziness, fainting, light-headedness, headaches, memory loss, night sweats and weakness.  Hematological: Negative for bruising/bleeding tendency and swollen glands.  Psychiatric/Behavioral: Negative for depressed mood, confusion and sleep disturbance. The patient is not nervous/anxious.     PMFS History:  Patient Active Problem List   Diagnosis Date Noted  . Rheumatoid arthritis with rheumatoid factor of multiple sites without organ or systems involvement (HCC) 09/10/2016  . Primary osteoarthritis of both hands 09/10/2016  . Primary osteoarthritis of both knees 09/10/2016  . Primary osteoarthritis of both feet 09/10/2016  . DJD (degenerative joint disease), cervical 09/10/2016  . History of tremor 09/10/2016  . History of sleep apnea 09/10/2016  . Coronary artery disease involving native heart 09/10/2016  . Smoker 09/10/2016  . High risk medication use 09/10/2016  . Tear of right rotator cuff 03/14/2012  . Impingement syndrome of right shoulder 03/14/2012  . AC (acromioclavicular) arthritis, right 03/14/2012  . Disorder of tendon of biceps 03/14/2012  Past Medical History:  Diagnosis Date  . AC (acromioclavicular) arthritis 03/14/2012  . Anxiety   . Arthritis    "shoulders; knees" (07/17/2013)  . DDD (degenerative disc disease)   . Depression   .  Disorder of tendon of biceps 03/14/2012  . GERD (gastroesophageal reflux disease)   . Hyperlipemia   . Hypertension   . Impingement syndrome of right shoulder 03/14/2012  . Pneumonia 2012   "once" (07/17/2013)  . Seasonal allergies   . Sleep apnea    "did test 2 months ago; get results in December" (07/17/2013)  . Tear of right rotator cuff 03/14/2012    Family History  Problem Relation Age of Onset  . Breast cancer Mother   . Hypertension Mother   . Hypertension Father   . Multiple sclerosis Sister    Past Surgical History:  Procedure Laterality Date  . ANTERIOR CERVICAL DECOMP/DISCECTOMY FUSION  08[place]  . BUNIONECTOMY Bilateral 1998  . DEBRIDEMENT AND CLOSURE WOUND  12/2012   "back of my neck" (07/17/2013)  . DEBRIDEMENT AND CLOSURE WOUND N/A 07/17/2013   Procedure: DEBRIDEMENT  OF SKIN, SUBCUTANEOUS TISSUE BONE, MUSCLE FLAP, AND CLOSURE WOUND;  Surgeon: Glenna Fellows, MD;  Location: [facility] OR;  Service: Plastics;  Laterality: N/A;  . EXCISIONAL HEMORRHOIDECTOMY  ~ 1996  . INCISION AND DRAINAGE OF WOUND  10/2012 X 2   "back of my neck" (07/17/2013)  . MUSCLE FLAP CLOSURE  07/17/2013   "to back of my neck" (07/17/2013)  . NECK SURGERY  09/2012   "spur removed from back of my neck" (07/17/2013)  . SHOULDER ARTHROSCOPY Left 2008  . SHOULDER ARTHROSCOPY W/ ROTATOR CUFF REPAIR Right 2013   "and bicep repair" (07/17/2013)  . TONSILLECTOMY  ~ 66   Social History   Social History Narrative  . Not on file    Objective: Vital Signs: BP 126/84 (BP Location: Left Arm, Patient Position: Sitting, Cuff Size: Large)   Pulse 75   Resp 16   Ht 6\' 2"  (1.88 m)   Wt 252 lb (114.3 kg)   BMI 32.35 kg/m    Physical Exam  Constitutional: He is oriented to person, place, and time. He appears well-developed and well-nourished.  HENT:  Head: Normocephalic and atraumatic.  Eyes: Pupils are equal, round, and reactive to light. Conjunctivae and EOM are normal.  Neck: Normal  range of motion. Neck supple.  Cardiovascular: Normal rate, regular rhythm and normal heart sounds.  Pulmonary/Chest: Effort normal and breath sounds normal.  Abdominal: Soft. Bowel sounds are normal.  Lymphadenopathy:    He has no cervical adenopathy.  Neurological: He is alert and oriented to person, place, and time.  Skin: Skin is warm and dry. Capillary refill takes less than 2 seconds.  Psychiatric: He has a normal mood and affect. His behavior is normal.  Nursing note and vitals reviewed.    Musculoskeletal Exam: C-spine limited ROM.  Lumbar spine limited range of motion.  No midline spinal tenderness.  No SI joint tenderness.  Right shoulder limited ROM.  Left shoulder full range of motion with no discomfort.  Elbow joints, wrist joints, MCPs, PIPs, and DIPs good ROM with no synovitis.  Complete fist formation.  Hip joints, knee joints, ankle joints, MTPs, PIPs, and DIPs good ROM with no synovitis.  crepitus of both knee joints.  Right knee warmth but no effusion noted.  CDAI Exam: CDAI Score: Not documented Patient Global Assessment: Not documented; Provider Global Assessment: Not documented Swollen: Not documented; Tender:  Not documented Joint Exam   Not documented   There is currently no information documented on the homunculus. Go to the Rheumatology activity and complete the homunculus joint exam.  Investigation: No additional findings.  Imaging: No results found.  Recent Labs: Lab Results  Component Value Date   WBC 4.2 12/18/2017   HGB 13.0 (L) 12/18/2017   PLT 243 12/18/2017   NA 136 12/18/2017   K 4.5 12/18/2017   CL 101 12/18/2017   CO2 28 12/18/2017   GLUCOSE 101 (H) 12/18/2017   BUN 24 12/18/2017   CREATININE 1.43 (H) 12/18/2017   BILITOT 0.5 12/18/2017   ALKPHOS 68 01/10/2017   AST 23 12/18/2017   ALT 23 12/18/2017   PROT 6.9 12/18/2017   ALBUMIN 4.6 01/10/2017   CALCIUM 9.5 12/18/2017   GFRAA 63 12/18/2017    Speciality Comments: No specialty  comments available.  Procedures:  Large Joint Inj: L knee on 05/16/2018 9:45 AM Indications: pain Details: 27 G 1.5 in needle, medial approach  Arthrogram: No  Medications: 1.5 mL lidocaine 1 %; 40 mg triamcinolone acetonide 40 MG/ML Aspirate: 0 mL Outcome: tolerated well, no immediate complications Procedure, treatment alternatives, risks and benefits explained, specific risks discussed. Consent was given by the patient. Immediately prior to procedure a time out was called to verify the correct patient, procedure, equipment, support staff and site/side marked as required. Patient was prepped and draped in the usual sterile fashion.     Allergies: Latex and Vancomycin   Assessment / Plan:     Visit Diagnoses: Rheumatoid arthritis with rheumatoid factor of multiple sites without organ or systems involvement (HCC) - positive anti-CCP antibody: He has no active synovitis at this time.  He has left knee warmth on exam but no effusion noted. A left knee cortisone injection was performed today. He has chronic neck and lower back pain but no other joint pain or joint swelling at this time.  He has not had any recent rheumatoid arthritis flares.  He was taking methotrexate 5 tablets by mouth once weekly but his recent lab work revealed elevated creatinine and low GFR.  He lowered his dose of methotrexate to 4 tablets by mouth once weekly and folic acid 1 mg daily.  He has not noticed any difference since lowering his dose of methotrexate.  He will continue taking methotrexate 4 tablets by mouth once weekly.  A refill of methotrexate was sent to the pharmacy today.  He is advised to notify us if he develops increased joint pain or joint swelling.  He will follow up in the office in 5 months.   High risk medication use - MTX 4 tabs po once weekly, folic acid.  CBC and CMP were drawn on 05/07/2018.  He will return in November and every 3 months for CBC and CMP to monitor for drug toxicity.  Primary  osteoarthritis of both hands: He has PIP and DIP synovial thickening consistent with osteoarthritis of bilateral hands.  He has complete fist formation bilaterally.  He has no discomfort in his hands at this time.  Joint protection and muscle strengthening were discussed.  Primary osteoarthritis of both knees: Left knee warmth on exam.  Bilateral knee crepitus.  A left knee cortisone injection was performed in the office today.  He tolerated procedure well.  He was advised to monitor blood pressure closely following the cortisone injection today.  He walks 3 miles daily.  Primary osteoarthritis of both feet: He has no feet pain or discomfort  at this time.  Was proper fitting shoes.  Impingement syndrome of right shoulder - status post right rotator cuff tear repair by Dr. Dion Saucier in September 2018: He has limited range of motion with discomfort of his right shoulder.  DDD (degenerative disc disease), cervical: He has limited range of motion of his C-spine with left-sided radiculopathy.  He was advised to follow-up at the [facility] for further evaluation.  Other medical conditions are listed as follows:  History of depression  History of coronary artery disease  History of tremor  History of sleep apnea  Smoker   Orders: Orders Placed This Encounter  Procedures  . Large Joint Inj: L knee   Meds ordered this encounter  Medications  . methotrexate (RHEUMATREX) 2.5 MG tablet    Sig: Take 4 tablets by mouth once weekly. Caution:Chemotherapy. Protect from light.    Dispense:  48 tablet    Refill:  0    Face-to-face time spent with patient was 30 minutes. Greater than 50% of time was spent in counseling and coordination of care.  Follow-Up Instructions: Return in about 5 months (around 10/16/2018) for Rheumatoid arthritis, Osteoarthritis, DDD.   Gearldine Bienenstock, PA-C   I examined and evaluated the patient with Sherron Ales PA.  History of osteoarthritis and rheumatoid arthritis.  His  rheumatoid arthritis seems to be quite well controlled.  Although he has some discomfort in his left knee joint which was warm to touch my exam.  He requested a cortisone injection which was performed in the office today.  We will continue to monitor his labs .  The plan of care was discussed as noted above.  Pollyann Savoy, MD  Note - This record has been created using Animal nutritionist.  Chart creation errors have been sought, but may not always  have been located. Such creation errors do not reflect on  the standard of medical care.

## 2018-05-16 ENCOUNTER — Encounter: Payer: Self-pay | Admitting: Rheumatology

## 2018-05-16 ENCOUNTER — Ambulatory Visit: Payer: Medicare (Managed Care) | Admitting: Rheumatology

## 2018-05-16 VITALS — BP 126/84 | HR 75 | Resp 16 | Ht 74.0 in | Wt 252.0 lb

## 2018-05-16 DIAGNOSIS — M19072 Primary osteoarthritis, left ankle and foot: Secondary | ICD-10-CM

## 2018-05-16 DIAGNOSIS — G8929 Other chronic pain: Secondary | ICD-10-CM

## 2018-05-16 DIAGNOSIS — Z79899 Other long term (current) drug therapy: Secondary | ICD-10-CM

## 2018-05-16 DIAGNOSIS — Z8659 Personal history of other mental and behavioral disorders: Secondary | ICD-10-CM

## 2018-05-16 DIAGNOSIS — M0579 Rheumatoid arthritis with rheumatoid factor of multiple sites without organ or systems involvement: Secondary | ICD-10-CM | POA: Diagnosis not present

## 2018-05-16 DIAGNOSIS — M25562 Pain in left knee: Secondary | ICD-10-CM | POA: Diagnosis not present

## 2018-05-16 DIAGNOSIS — M7541 Impingement syndrome of right shoulder: Secondary | ICD-10-CM

## 2018-05-16 DIAGNOSIS — M19071 Primary osteoarthritis, right ankle and foot: Secondary | ICD-10-CM

## 2018-05-16 DIAGNOSIS — M5030 Other cervical disc degeneration, unspecified cervical region: Secondary | ICD-10-CM

## 2018-05-16 DIAGNOSIS — M170 Bilateral primary osteoarthritis of knee: Secondary | ICD-10-CM

## 2018-05-16 DIAGNOSIS — Z8679 Personal history of other diseases of the circulatory system: Secondary | ICD-10-CM

## 2018-05-16 DIAGNOSIS — M19041 Primary osteoarthritis, right hand: Secondary | ICD-10-CM

## 2018-05-16 DIAGNOSIS — Z8669 Personal history of other diseases of the nervous system and sense organs: Secondary | ICD-10-CM

## 2018-05-16 DIAGNOSIS — F17200 Nicotine dependence, unspecified, uncomplicated: Secondary | ICD-10-CM

## 2018-05-16 DIAGNOSIS — M19042 Primary osteoarthritis, left hand: Secondary | ICD-10-CM

## 2018-05-16 MED ORDER — LIDOCAINE HCL 1 % IJ SOLN
1.5000 mL | INTRAMUSCULAR | Status: AC | PRN
  Administered 2018-05-16: 1.5 mL

## 2018-05-16 MED ORDER — TRIAMCINOLONE ACETONIDE 40 MG/ML IJ SUSP
40.0000 mg | INTRAMUSCULAR | Status: AC | PRN
  Administered 2018-05-16: 40 mg via INTRA_ARTICULAR

## 2018-05-16 MED ORDER — METHOTREXATE 2.5 MG PO TABS: ORAL_TABLET | ORAL | 0 refills | Status: DC

## 2018-05-16 NOTE — Patient Instructions (Addendum)
Standing Labs We placed an order today for your standing lab work.    Please come back and get your standing labs in November and every 3 months   We have open lab Monday through Friday from 8:30-11:30 AM and 1:30-4:00 PM  at the office of Dr. Pollyann Savoy.   You may experience shorter wait times on Monday and Friday afternoons. The office is located at [address], [address], [place], [place] [address] No appointment is necessary.   Labs are drawn by [facility].  You may receive a bill from [facility] for your lab work. If you have any questions regarding directions or hours of operation,  please call [phone].     Knee Exercises Ask your health care provider which exercises are safe for you. Do exercises exactly as told by your health care provider and adjust them as directed. It is normal to feel mild stretching, pulling, tightness, or discomfort as you do these exercises, but you should stop right away if you feel sudden pain or your pain gets worse.Do not begin these exercises until told by your health care provider. STRETCHING AND RANGE OF MOTION EXERCISES These exercises warm up your muscles and joints and improve the movement and flexibility of your knee. These exercises also help to relieve pain, numbness, and tingling. Exercise A: Knee Extension, Prone 1. Lie on your abdomen on a bed. 2. Place your left / right knee just beyond the edge of the surface so your knee is not on the bed. You can put a towel under your left / right thigh just above your knee for comfort. 3. Relax your leg muscles and allow gravity to straighten your knee. You should feel a stretch behind your left / right knee. 4. Hold this position for __________ seconds. 5. Scoot up so your knee is supported between repetitions. Repeat __________ times. Complete this stretch __________ times a day. Exercise B: Knee Flexion, Active  1. Lie on your back with both knees straight. If this causes back discomfort,  bend your left / right knee so your foot is flat on the floor. 2. Slowly slide your left / right heel back toward your buttocks until you feel a gentle stretch in the front of your knee or thigh. 3. Hold this position for __________ seconds. 4. Slowly slide your left / right heel back to the starting position. Repeat __________ times. Complete this exercise __________ times a day. Exercise C: Quadriceps, Prone  1. Lie on your abdomen on a firm surface, such as a bed or padded floor. 2. Bend your left / right knee and hold your ankle. If you cannot reach your ankle or pant leg, loop a belt around your foot and grab the belt instead. 3. Gently pull your heel toward your buttocks. Your knee should not slide out to the side. You should feel a stretch in the front of your thigh and knee. 4. Hold this position for __________ seconds. Repeat __________ times. Complete this stretch __________ times a day. Exercise D: Hamstring, Supine 1. Lie on your back. 2. Loop a belt or towel over the ball of your left / right foot. The ball of your foot is on the walking surface, right under your toes. 3. Straighten your left / right knee and slowly pull on the belt to raise your leg until you feel a gentle stretch behind your knee. ? Do not let your left / right knee bend while you do this. ? Keep your other leg flat  on the floor. 4. Hold this position for __________ seconds. Repeat __________ times. Complete this stretch __________ times a day. STRENGTHENING EXERCISES These exercises build strength and endurance in your knee. Endurance is the ability to use your muscles for a long time, even after they get tired. Exercise E: Quadriceps, Isometric  1. Lie on your back with your left / right leg extended and your other knee bent. Put a rolled towel or small pillow under your knee if told by your health care provider. 2. Slowly tense the muscles in the front of your left / right thigh. You should see your kneecap  slide up toward your hip or see increased dimpling just above the knee. This motion will push the back of the knee toward the floor. 3. For __________ seconds, keep the muscle as tight as you can without increasing your pain. 4. Relax the muscles slowly and completely. Repeat __________ times. Complete this exercise __________ times a day. Exercise F: Straight Leg Raises - Quadriceps 1. Lie on your back with your left / right leg extended and your other knee bent. 2. Tense the muscles in the front of your left / right thigh. You should see your kneecap slide up or see increased dimpling just above the knee. Your thigh may even shake a bit. 3. Keep these muscles tight as you raise your leg 4-6 inches (10-15 cm) off the floor. Do not let your knee bend. 4. Hold this position for __________ seconds. 5. Keep these muscles tense as you lower your leg. 6. Relax your muscles slowly and completely after each repetition. Repeat __________ times. Complete this exercise __________ times a day. Exercise G: Hamstring, Isometric 1. Lie on your back on a firm surface. 2. Bend your left / right knee approximately __________ degrees. 3. Dig your left / right heel into the surface as if you are trying to pull it toward your buttocks. Tighten the muscles in the back of your thighs to dig as hard as you can without increasing any pain. 4. Hold this position for __________ seconds. 5. Release the tension gradually and allow your muscles to relax completely for __________ seconds after each repetition. Repeat __________ times. Complete this exercise __________ times a day. Exercise H: Hamstring Curls  If told by your health care provider, do this exercise while wearing ankle weights. Begin with __________ weights. Then increase the weight by 1 lb (0.5 kg) increments. Do not wear ankle weights that are more than __________. 1. Lie on your abdomen with your legs straight. 2. Bend your left / right knee as far as you  can without feeling pain. Keep your hips flat against the floor. 3. Hold this position for __________ seconds. 4. Slowly lower your leg to the starting position.  Repeat __________ times. Complete this exercise __________ times a day. Exercise I: Squats (Quadriceps) 1. Stand in front of a table, with your feet and knees pointing straight ahead. You may rest your hands on the table for balance but not for support. 2. Slowly bend your knees and lower your hips like you are going to sit in a chair. ? Keep your weight over your heels, not over your toes. ? Keep your lower legs upright so they are parallel with the table legs. ? Do not let your hips go lower than your knees. ? Do not bend lower than told by your health care provider. ? If your knee pain increases, do not bend as low. 3. Hold the squat position  for __________ seconds. 4. Slowly push with your legs to return to standing. Do not use your hands to pull yourself to standing. Repeat __________ times. Complete this exercise __________ times a day. Exercise J: Wall Slides (Quadriceps)  1. Lean your back against a smooth wall or door while you walk your feet out 18-24 inches (46-61 cm) from it. 2. Place your feet hip-width apart. 3. Slowly slide down the wall or door until your knees bend __________ degrees. Keep your knees over your heels, not over your toes. Keep your knees in line with your hips. 4. Hold for __________ seconds. Repeat __________ times. Complete this exercise __________ times a day. Exercise K: Straight Leg Raises - Hip Abductors 1. Lie on your side with your left / right leg in the top position. Lie so your head, shoulder, knee, and hip line up. You may bend your bottom knee to help you keep your balance. 2. Roll your hips slightly forward so your hips are stacked directly over each other and your left / right knee is facing forward. 3. Leading with your heel, lift your top leg 4-6 inches (10-15 cm). You should feel  the muscles in your outer hip lifting. ? Do not let your foot drift forward. ? Do not let your knee roll toward the ceiling. 4. Hold this position for __________ seconds. 5. Slowly return your leg to the starting position. 6. Let your muscles relax completely after each repetition. Repeat __________ times. Complete this exercise __________ times a day. Exercise L: Straight Leg Raises - Hip Extensors 1. Lie on your abdomen on a firm surface. You can put a pillow under your hips if that is more comfortable. 2. Tense the muscles in your buttocks and lift your left / right leg about 4-6 inches (10-15 cm). Keep your knee straight as you lift your leg. 3. Hold this position for __________ seconds. 4. Slowly lower your leg to the starting position. 5. Let your leg relax completely after each repetition. Repeat __________ times. Complete this exercise __________ times a day. This information is not intended to replace advice given to you by your health care provider. Make sure you discuss any questions you have with your health care provider. Document Released: 07/25/2005 Document Revised: 06/04/2016 Document Reviewed: 07/17/2015 Elsevier Interactive Patient Education  2018 [employer].

## 2018-08-20 ENCOUNTER — Other Ambulatory Visit: Payer: Self-pay | Admitting: Physician Assistant

## 2018-08-20 ENCOUNTER — Other Ambulatory Visit: Payer: Self-pay | Admitting: Rheumatology

## 2018-08-20 ENCOUNTER — Telehealth: Payer: Self-pay | Admitting: Rheumatology

## 2018-08-20 DIAGNOSIS — Z79899 Other long term (current) drug therapy: Secondary | ICD-10-CM

## 2018-08-20 NOTE — Telephone Encounter (Signed)
Per Dr. Corliss Skains okay to refill 30 day supply since patient is getting labs performed today.

## 2018-08-20 NOTE — Telephone Encounter (Signed)
Orders have been faxed

## 2018-08-20 NOTE — Telephone Encounter (Signed)
Last visit: 05/16/18 Next Visit: 10/17/18  Okay to refill per Dr. Corliss Skains

## 2018-08-20 NOTE — Telephone Encounter (Addendum)
Last visit: 05/16/18 Next Visit: 10/17/18 Labs: 05/07/18 Creat. 1.56 GFR 56 increased glucose  Spoke with patient and advised he is due for labs. Patient is calling PCP office to get an appointment for today.

## 2018-08-20 NOTE — Telephone Encounter (Signed)
Patient going to [facility] to have labs drawn today. Patient wants to make sure you refill his medication. Please send in orders.

## 2018-08-20 NOTE — Telephone Encounter (Signed)
Ok to refill after labs

## 2018-10-03 ENCOUNTER — Other Ambulatory Visit: Payer: Self-pay | Admitting: Rheumatology

## 2018-10-03 NOTE — Progress Notes (Signed)
Office Visit Note  Patient: Dylan Dudley             Date of Birth: April 08, 1961           MRN: [id number]             PCP: Tobe Sos, MD Referring: Tobe Sos, MD Visit Date: 10/17/2018 Occupation: '@GUAROCC'$ @  Subjective:  Right knee pain   History of Present Illness: Dylan Dudley is a 58 y.o. male with history of seropositive rheumatoid arthritis, osteoarthritis, and DDD.  He is taking methotrexate 4 tablets by mouth once weekly and folic acid 1 mg by mouth daily.  He denies any recent rheumatoid arthritis flares.  He reports that he has been walking more for exercise on a regular basis.  He states he continues to have chronic bilateral knee pain but his right knee has been giving an increased discomfort over the past 2 days.  He denies any joint swelling.  He continues to have chronic neck and lower back pain.  He states he has had some increased neck stiffness and will be going to a chiropractor in doing acupuncture.  He states he continues to have right shoulder limited range of motion with discomfort.  He has been taking ibuprofen 800 mg once daily which does not provide much pain relief.  He denies any recent infections.  He receives annual influenza vaccination.  He does not need any refills of his medications at this time.  He does not want to make any changes to his current treatment regimen.   Activities of Daily Living:  Patient reports morning stiffness for 30 minutes.   Patient Reports nocturnal pain.  Difficulty dressing/grooming: Denies Difficulty climbing stairs: Reports Difficulty getting out of chair: Reports Difficulty using hands for taps, buttons, cutlery, and/or writing: Denies  Review of Systems  Constitutional: Negative for fatigue and night sweats.  HENT: Negative for mouth sores, trouble swallowing, trouble swallowing, mouth dryness and nose dryness.   Eyes: Negative for pain, redness, itching and dryness.  Respiratory: Negative for  cough, hemoptysis, shortness of breath, wheezing and difficulty breathing.   Cardiovascular: Negative for chest pain, palpitations, hypertension, irregular heartbeat and swelling in legs/feet.  Gastrointestinal: Negative for abdominal pain, blood in stool, constipation and diarrhea.  Endocrine: Negative for increased urination.  Genitourinary: Negative for painful urination, pelvic pain and urgency.  Musculoskeletal: Positive for arthralgias, joint pain and morning stiffness. Negative for joint swelling, myalgias, muscle weakness, muscle tenderness and myalgias.  Skin: Negative for color change, rash, hair loss, nodules/bumps, redness, skin tightness, ulcers and sensitivity to sunlight.  Allergic/Immunologic: Negative for susceptible to infections.  Neurological: Negative for dizziness, fainting, light-headedness, headaches, memory loss, night sweats and weakness.  Hematological: Negative for swollen glands.  Psychiatric/Behavioral: Positive for depressed mood. Negative for confusion and sleep disturbance. The patient is nervous/anxious.     PMFS History:  Patient Active Problem List   Diagnosis Date Noted  . Rheumatoid arthritis with rheumatoid factor of multiple sites without organ or systems involvement ([place]) 09/10/2016  . Primary osteoarthritis of both hands 09/10/2016  . Primary osteoarthritis of both knees 09/10/2016  . Primary osteoarthritis of both feet 09/10/2016  . DJD (degenerative joint disease), cervical 09/10/2016  . History of tremor 09/10/2016  . History of sleep apnea 09/10/2016  . Coronary artery disease involving native heart 09/10/2016  . Smoker 09/10/2016  . High risk medication use 09/10/2016  . Tear of right rotator cuff 03/14/2012  . Impingement syndrome of  right shoulder 03/14/2012  . AC (acromioclavicular) arthritis, right 03/14/2012  . Disorder of tendon of biceps 03/14/2012    Past Medical History:  Diagnosis Date  . AC (acromioclavicular) arthritis  03/14/2012  . Anxiety   . Arthritis    "shoulders; knees" (07/17/2013)  . DDD (degenerative disc disease)   . Depression   . Disorder of tendon of biceps 03/14/2012  . GERD (gastroesophageal reflux disease)   . Hyperlipemia   . Hypertension   . Impingement syndrome of right shoulder 03/14/2012  . Pneumonia 2012   "once" (07/17/2013)  . Seasonal allergies   . Sleep apnea    "did test 2 months ago; get results in December" (07/17/2013)  . Tear of right rotator cuff 03/14/2012    Family History  Problem Relation Age of Onset  . Breast cancer Mother   . Hypertension Mother   . Hypertension Father   . Multiple sclerosis Sister    Past Surgical History:  Procedure Laterality Date  . ANTERIOR CERVICAL DECOMP/DISCECTOMY FUSION  08/2011   [place]  . BUNIONECTOMY Bilateral 1998  . DEBRIDEMENT AND CLOSURE WOUND  12/2012   "back of my neck" (07/17/2013)  . DEBRIDEMENT AND CLOSURE WOUND N/A 07/17/2013   Procedure: DEBRIDEMENT  OF SKIN, SUBCUTANEOUS TISSUE BONE, MUSCLE FLAP, AND CLOSURE WOUND;  Surgeon: Irene Limbo, MD;  Location: [facility];  Service: Plastics;  Laterality: N/A;  . EXCISIONAL HEMORRHOIDECTOMY  ~ 1996  . INCISION AND DRAINAGE OF WOUND  10/2012 X 2   "back of my neck" (07/17/2013)  . MUSCLE FLAP CLOSURE  07/17/2013   "to back of my neck" (07/17/2013)  . NECK SURGERY  09/2012   "spur removed from back of my neck" (07/17/2013)  . SHOULDER ARTHROSCOPY Left 2008  . SHOULDER ARTHROSCOPY W/ ROTATOR CUFF REPAIR Right 2013   "and bicep repair" (07/17/2013)  . TONSILLECTOMY  ~ 4   Social History   Social History Narrative  . Not on file   Immunization History  Administered Date(s) Administered  . Influenza,inj,Quad PF,6+ Mos 07/18/2013  . Pneumococcal Polysaccharide-23 07/18/2013     Objective: Vital Signs: BP (!) 145/94 (BP Location: Left Wrist, Patient Position: Sitting, Cuff Size: Normal)   Pulse 62   Resp 15   Ht '6\' 2"'$  (1.88 m)   Wt 266 lb 9.6 oz (120.9 kg)    BMI 34.23 kg/m    Physical Exam Vitals signs and nursing note reviewed.  Constitutional:      Appearance: He is well-developed.  HENT:     Head: Normocephalic and atraumatic.  Eyes:     Conjunctiva/sclera: Conjunctivae normal.     Pupils: Pupils are equal, round, and reactive to light.  Neck:     Musculoskeletal: Normal range of motion and neck supple.  Cardiovascular:     Rate and Rhythm: Normal rate and regular rhythm.     Heart sounds: Normal heart sounds.  Pulmonary:     Effort: Pulmonary effort is normal.     Breath sounds: Normal breath sounds.  Abdominal:     General: Bowel sounds are normal.     Palpations: Abdomen is soft.  Skin:    General: Skin is warm and dry.     Capillary Refill: Capillary refill takes less than 2 seconds.  Neurological:     Mental Status: He is alert and oriented to person, place, and time.  Psychiatric:        Behavior: Behavior normal.      Musculoskeletal Exam: C-spine limited ROM.  Thoracic and lumbar spine good ROM.  No midline spinal tenderness.  No SI joint tenderness.  Right shoulder limited abduction to 90 degrees.  Left shoulder full ROM with no discomfort.  Elbow joints, wrist joints, MCPs, PIPs, and DIPs good ROM with no synovitis.  Complete fist formation bilaterally.  Hip joints good ROM with no discomfort.  Right knee warmth on exam. No tenderness or swelling of ankle joints.  No tenderness of MTPs.    CDAI Exam: CDAI Score: Not documented Patient Global Assessment: Not documented; Provider Global Assessment: Not documented Swollen: Not documented; Tender: Not documented Joint Exam   Not documented   There is currently no information documented on the homunculus. Go to the Rheumatology activity and complete the homunculus joint exam.  Investigation: No additional findings.  Imaging: No results found.  Recent Labs: Lab Results  Component Value Date   WBC 4.2 12/18/2017   HGB 13.0 (L) 12/18/2017   PLT 243  12/18/2017   NA 136 12/18/2017   K 4.5 12/18/2017   CL 101 12/18/2017   CO2 28 12/18/2017   GLUCOSE 101 (H) 12/18/2017   BUN 24 12/18/2017   CREATININE 1.43 (H) 12/18/2017   BILITOT 0.5 12/18/2017   ALKPHOS 68 01/10/2017   AST 23 12/18/2017   ALT 23 12/18/2017   PROT 6.9 12/18/2017   ALBUMIN 4.6 01/10/2017   CALCIUM 9.5 12/18/2017   GFRAA 63 12/18/2017    Speciality Comments: No specialty comments available.  Procedures:  No procedures performed Allergies: Latex and Vancomycin       Assessment / Plan:     Visit Diagnoses: Rheumatoid arthritis with rheumatoid factor of multiple sites without organ or systems involvement ([facility]) - +CCP: He has right knee warmth on exam today.  He continues to have chronic pain in bilateral knee joints.  He has no synovitis in his hands or feet.  He continues take methotrexate 4 tablets by mouth once weekly and folic acid 1 mg by mouth daily.  We had to reduce his dose of methotrexate from 5 tablets to 4 tablets by mouth once weekly due to elevated creatinine.  He continues to take ibuprofen 800 mg by mouth daily.  We discussed adding on Plaquenil as combination therapy.  Indications, contraindications, and potential side effects of PLQ were discussed.  Consent was obtained today.  All questions were addressed.  He was advised to avoid all NSAIDs.  He was given a Plaquenil eye exam form today in the office.  He will follow-up in the office in 3 months.  He will return for lab work in 1 month and then every 3 months.  He will continue on methotrexate 4 tablets by mouth once weekly and folic acid 1 mg by mouth daily.  He does not need any refills at this time.  He was advised to notify us if he develops increased joint pain or joint swelling.  Patient was counseled on the purpose, proper use, and adverse effects of hydroxychloroquine including nausea/diarrhea, skin rash, headaches, and sun sensitivity.  Discussed importance of annual eye exams while on  hydroxychloroquine to monitor to ocular toxicity and discussed importance of frequent laboratory monitoring.  Provided patient with eye exam form for baseline ophthalmologic exam.  Provided patient with educational materials on hydroxychloroquine and answered all questions.  Patient consented to hydroxychloroquine.  Will upload consent in the media tab.    Dose will be Plaquenil 200 mg twice daily.  Prescription pending lab results.  High risk medication use -  MTX 4 tabs po once weekly, folic acid 1 mg po daily.  Methotrexate was reduced from 5 tablets by mouth once weekly to 4 tablets by mouth once weekly in August 2019 due to elevated creatinine.  Most recent CBC/CMP within normal limits except low hemoglobin and elevated creatinine on 08/20/18.  He continues to take ibuprofen 800 mg by mouth daily for pain relief.  He was advised to avoid all NSAIDs.  Risk of starting him on Plaquenil as combination therapy.  He was given a Plaquenil eye exam form today in the office.  He will obtain labs in 1 month and then every 3 months.  He has not had any recent infections.  He received the annual influenza vaccination.  Primary osteoarthritis of both hands: He has no tenderness or synovitis on exam.  He is complete fist formation bilaterally.  Joint protection and muscle strengthening were discussed.  Primary osteoarthritis of both knees: He has chronic pain in bilateral knee joints.  He has right knee warmth on exam today.  He has been having increased pain in the right knee joint for the past 2 to 3 days due to walking more frequently for exercise.  He has been taking ibuprofen 800 mg by mouth daily.  He declined a cortisone injection today in the office.  He declined a x-ray.  He was encouraged to ice, elevate, and rest the right knee until the inflammation has subsided.  He will also be starting on Plaquenil combination therapy with methotrexate.  Primary osteoarthritis of both feet: He has no discomfort in  his feet at this time.  He was proper fitting shoes  Impingement syndrome of right shoulder - Status post right rotator cuff tear repair by Dr. Mardelle Matte in September 2018.  He continues to have limited range of motion.  He has abduction to about 90 degrees on exam.  DDD (degenerative disc disease), cervical -he has limited range of motion with discomfort.  He has no symptoms of radiculopathy at this time.  He will be starting to see a chiropractor on a regular basis and receiving acupuncture.  Other medical conditions are listed as follows:  History of depression  History of coronary artery disease  History of tremor  History of sleep apnea  Smoker   Orders: Orders Placed This Encounter  Procedures  . CBC with Differential/Platelet  . CMP14+EGFR   Meds ordered this encounter  Medications  . hydroxychloroquine (PLAQUENIL) 200 MG tablet    Sig: Take 1 tablet (200 mg total) by mouth 2 (two) times daily.    Dispense:  180 tablet    Refill:  0    Face-to-face time spent with patient was 30 minutes. Greater than 50% of time was spent in counseling and coordination of care.  Follow-Up Instructions: Return in about 3 months (around 01/16/2019) for Rheumatoid arthritis, Osteoarthritis.   Hazel Sams, PA-C  I examined and evaluated the patient with Hazel Sams PA.  Patient had synovitis in multiple joints on my examination as described above.  I detailed discussion with patient regarding different treatment options and their side effects.  Due to elevated creatinine he is noted able to take higher dose of methotrexate.  After discussing indication side effects contraindications we decided to start him on Plaquenil.  The plan of care was discussed as noted above.  Bo Merino, MD  Note - This record has been created using Editor, commissioning.  Chart creation errors have been sought, but may not always  have been located.  Such creation errors do not reflect on  the standard of medical  care.

## 2018-10-03 NOTE — Telephone Encounter (Signed)
Last visit: 05/16/18 Next Visit: 10/17/18 Labs: 08/20/18 RBC 3.98 Creat 1.50 GFR 59 Hgb 12.9  Okay to refill per Dr. Corliss Skains

## 2018-10-17 ENCOUNTER — Ambulatory Visit: Payer: Medicare (Managed Care) | Admitting: Rheumatology

## 2018-10-17 ENCOUNTER — Encounter: Payer: Self-pay | Admitting: Physician Assistant

## 2018-10-17 VITALS — BP 145/94 | HR 62 | Resp 15 | Ht 74.0 in | Wt 266.6 lb

## 2018-10-17 DIAGNOSIS — M5030 Other cervical disc degeneration, unspecified cervical region: Secondary | ICD-10-CM

## 2018-10-17 DIAGNOSIS — F17200 Nicotine dependence, unspecified, uncomplicated: Secondary | ICD-10-CM

## 2018-10-17 DIAGNOSIS — Z8669 Personal history of other diseases of the nervous system and sense organs: Secondary | ICD-10-CM

## 2018-10-17 DIAGNOSIS — Z79899 Other long term (current) drug therapy: Secondary | ICD-10-CM

## 2018-10-17 DIAGNOSIS — M19071 Primary osteoarthritis, right ankle and foot: Secondary | ICD-10-CM

## 2018-10-17 DIAGNOSIS — M19041 Primary osteoarthritis, right hand: Secondary | ICD-10-CM

## 2018-10-17 DIAGNOSIS — M0579 Rheumatoid arthritis with rheumatoid factor of multiple sites without organ or systems involvement: Secondary | ICD-10-CM

## 2018-10-17 DIAGNOSIS — M19072 Primary osteoarthritis, left ankle and foot: Secondary | ICD-10-CM

## 2018-10-17 DIAGNOSIS — M7541 Impingement syndrome of right shoulder: Secondary | ICD-10-CM

## 2018-10-17 DIAGNOSIS — M170 Bilateral primary osteoarthritis of knee: Secondary | ICD-10-CM

## 2018-10-17 DIAGNOSIS — Z8679 Personal history of other diseases of the circulatory system: Secondary | ICD-10-CM

## 2018-10-17 DIAGNOSIS — M19042 Primary osteoarthritis, left hand: Secondary | ICD-10-CM

## 2018-10-17 DIAGNOSIS — Z8659 Personal history of other mental and behavioral disorders: Secondary | ICD-10-CM

## 2018-10-17 MED ORDER — HYDROXYCHLOROQUINE SULFATE 200 MG PO TABS: 200.0000 mg | ORAL_TABLET | Freq: Two times a day (BID) | ORAL | 0 refills | Status: DC

## 2018-10-17 NOTE — Progress Notes (Signed)
Pharmacy Note  Subjective: Patient presents today to the [facility] to see Dr. Corliss Skainseveshwar.  Patient seen by the pharmacist for counseling on hydroxychloroquine rheumatoid arthritis.  He is currently on methotrexate but his dose has had to be gradually decreased due to elevated creatinine.  He is also taking ibuprofen 800 mg daily.  Objective: CMP     Component Value Date/Time   NA 136 12/18/2017 0849   NA 139 01/10/2017 0000   K 4.5 12/18/2017 0849   CL 101 12/18/2017 0849   CO2 28 12/18/2017 0849   GLUCOSE 101 (H) 12/18/2017 0849   BUN 24 12/18/2017 0849   BUN 20 01/10/2017 0000   CREATININE 1.43 (H) 12/18/2017 0849   CALCIUM 9.5 12/18/2017 0849   PROT 6.9 12/18/2017 0849   PROT 7.3 01/10/2017 0000   ALBUMIN 4.6 01/10/2017 0000   AST 23 12/18/2017 0849   ALT 23 12/18/2017 0849   ALKPHOS 68 01/10/2017 0000   BILITOT 0.5 12/18/2017 0849   BILITOT 0.7 01/10/2017 0000   GFRNONAA 54 (L) 12/18/2017 0849   GFRAA 63 12/18/2017 0849    CBC    Component Value Date/Time   WBC 4.2 12/18/2017 0849   RBC 4.00 (L) 12/18/2017 0849   HGB 13.0 (L) 12/18/2017 0849   HGB 13.8 01/10/2017 0000   HCT 38.1 (L) 12/18/2017 0849   HCT 41.3 01/10/2017 0000   PLT 243 12/18/2017 0849   PLT 301 01/10/2017 0000   MCV 95.3 12/18/2017 0849   MCV 95 01/10/2017 0000   MCH 32.5 12/18/2017 0849   MCHC 34.1 12/18/2017 0849   RDW 13.6 12/18/2017 0849   RDW 15.1 01/10/2017 0000   LYMPHSABS 928 12/18/2017 0849   LYMPHSABS 1.0 01/10/2017 0000   MONOABS 352 09/11/2016 0924   EOSABS 109 12/18/2017 0849   EOSABS 0.1 01/10/2017 0000   BASOSABS 29 12/18/2017 0849   BASOSABS 0.0 01/10/2017 0000    Assessment/Plan: Patient was counseled on the purpose, proper use, and adverse effects of hydroxychloroquine including nausea/diarrhea, skin rash, headaches, and sun sensitivity.  Discussed importance of annual eye exams while on hydroxychloroquine to monitor to ocular toxicity and discussed  importance of frequent laboratory monitoring.  Provided patient with eye exam form for baseline ophthalmologic exam and standing lab instructions.  He is to have labs in 1 month and then every 3 months to monitor for drug toxicity.  [place] done [place].  Placed standing orders for [facility].  Reviewed the importance of staying up to date with immunizations.  He reports he had his flu shot for this season and previously Pneumovax 23.  Recommend Prevnar 13 and Shingrix vaccine especially as patient has had previous shingles outbreak.   Provided patient with educational materials on hydroxychloroquine and answered all questions.  Patient consented to hydroxychloroquine.  Will upload consent in the media tab.    Dose will be Plaquenil 200 mg twice daily.  Pres[place] to [place] pharmacy [place].  Instructed patient to discontinue ibuprofen in order to try and improve his kidney function.  All questions encouraged and answered.  Instructed patient to call with any further questions or concerns.  Verlin FesterAmber Kasaundra Fahrney, PharmD, [facility]BCACP Rheumatology Clinical Pharmacist  10/17/2018 10:21 AM

## 2018-10-17 NOTE — Patient Instructions (Addendum)
Stop taking ibruprofen. Start Plaquenil 200mg  twice daily with food.  Standing Labs We placed an order today for your standing lab work.    Please come back and get your standing labs in 1 month and then every 3 months.  We have open lab Monday through Friday from 8:30-11:30 AM and 1:30-4:00 PM  at the office of Dr. Pollyann SavoyShaili Deveshwar.   You may experience shorter wait times on Monday and Friday afternoons. The office is located at [address], [address], [place], [place] [address] No appointment is necessary.   Labs are drawn by [facility].  You may receive a bill from [facility] for your lab work.  If you wish to have your labs drawn at another location, please call the office 24 hours in advance to send orders.  If you have any questions regarding directions or hours of operation,  please call [phone].   Just as a reminder please drink plenty of water prior to coming for your lab work. Thanks!  Vaccines You are taking a medication(s) that can suppress your immune system.  The following immunizations are recommended: . Flu annually . Pneumonia (Pneumovax 23 and Prevnar 13 spaced at least 1 year apart) . Shingrix  Please check with your PCP to make sure you are up to date.  Hydroxychloroquine tablets What is this medicine? HYDROXYCHLOROQUINE (hye drox ee KLOR oh kwin) is used to treat rheumatoid arthritis and systemic lupus erythematosus. It is also used to treat malaria. This medicine may be used for other purposes; ask your health care provider or pharmacist if you have questions. COMMON BRAND NAME(S): Plaquenil, Quineprox What should I tell my health care provider before I take this medicine? They need to know if you have any of these conditions: -diabetes -eye disease, vision problems -G6PD deficiency -history of blood diseases -history of irregular heartbeat -if you often drink alcohol -kidney disease -liver disease -porphyria -psoriasis -seizures -an unusual or  allergic reaction to chloroquine, hydroxychloroquine, other medicines, foods, dyes, or preservatives -pregnant or trying to get pregnant -breast-feeding How should I use this medicine? Take this medicine by mouth with a glass of water. Follow the directions on the prescription label. Avoid taking antacids within 4 hours of taking this medicine. It is best to separate these medicines by at least 4 hours. Do not cut, crush or chew this medicine. You can take it with or without food. If it upsets your stomach, take it with food. Take your medicine at regular intervals. Do not take your medicine more often than directed. Take all of your medicine as directed even if you think you are better. Do not skip doses or stop your medicine early. Talk to your pediatrician regarding the use of this medicine in children. While this drug may be prescribed for selected conditions, precautions do apply. Overdosage: If you think you have taken too much of this medicine contact a poison control center or emergency room at once. NOTE: This medicine is only for you. Do not share this medicine with others. What if I miss a dose? If you miss a dose, take it as soon as you can. If it is almost time for your next dose, take only that dose. Do not take double or extra doses. What may interact with this medicine? Do not take this medicine with any of the following medications: -cisapride -dofetilide -dronedarone -live virus vaccines -penicillamine -pimozide -thioridazine -ziprasidone This medicine may also interact with the following medications: -ampicillin -antacids -cimetidine -cyclosporine -digoxin -medicines for diabetes,  like insulin, glipizide, glyburide -medicines for seizures like carbamazepine, phenobarbital, phenytoin -mefloquine -methotrexate -other medicines that prolong the QT interval (cause an abnormal heart rhythm) -praziquantel This list may not describe all possible interactions. Give your  health care provider a list of all the medicines, herbs, non-prescription drugs, or dietary supplements you use. Also tell them if you smoke, drink alcohol, or use illegal drugs. Some items may interact with your medicine. What should I watch for while using this medicine? Tell your doctor or healthcare professional if your symptoms do not start to get better or if they get worse. Avoid taking antacids within 4 hours of taking this medicine. It is best to separate these medicines by at least 4 hours. Tell your doctor or health care professional right away if you have any change in your eyesight. Your vision and blood may be tested before and during use of this medicine. This medicine can make you more sensitive to the sun. Keep out of the sun. If you cannot avoid being in the sun, wear protective clothing and use sunscreen. Do not use sun lamps or tanning beds/booths. What side effects may I notice from receiving this medicine? Side effects that you should report to your doctor or health care professional as soon as possible: -allergic reactions like skin rash, itching or hives, swelling of the face, lips, or tongue -changes in vision -decreased hearing or ringing of the ears -redness, blistering, peeling or loosening of the skin, including inside the mouth -seizures -sensitivity to light -signs and symptoms of a dangerous change in heartbeat or heart rhythm like chest pain; dizziness; fast or irregular heartbeat; palpitations; feeling faint or lightheaded, falls; breathing problems -signs and symptoms of liver injury like dark yellow or brown urine; general ill feeling or flu-like symptoms; light-colored stools; loss of appetite; nausea; right upper belly pain; unusually weak or tired; yellowing of the eyes or skin -signs and symptoms of low blood sugar such as feeling anxious; confusion; dizziness; increased hunger; unusually weak or tired; sweating; shakiness; cold; irritable; headache; blurred  vision; fast heartbeat; loss of consciousness -uncontrollable head, mouth, neck, arm, or leg movements Side effects that usually do not require medical attention (report to your doctor or health care professional if they continue or are bothersome): -anxious -diarrhea -dizziness -hair loss -headache -irritable -loss of appetite -nausea, vomiting -stomach pain This list may not describe all possible side effects. Call your doctor for medical advice about side effects. You may report side effects to FDA at 1-800-FDA-1088. Where should I keep my medicine? Keep out of the reach of children. In children, this medicine can cause overdose with small doses. Store at room temperature between 15 and 30 degrees C (59 and 86 degrees F). Protect from moisture and light. Throw away any unused medicine after the expiration date. NOTE: This sheet is a summary. It may not cover all possible information. If you have questions about this medicine, talk to your doctor, pharmacist, or health care provider.  2019 Elsevier/Gold Standard (2016-04-25 14:16:15)

## 2018-11-19 ENCOUNTER — Telehealth: Payer: Self-pay | Admitting: Pharmacist

## 2018-11-19 NOTE — Telephone Encounter (Signed)
[facility] with [facility]il toxicity eye exam.  Results were normal.  On the form they monitor that the patient had already discontinued Plaquenil.  Called patient to follow-up.  Patient states that he did start Plaquenil and is still taking it.  He states he has been tolerating it well.  Patient ended phone call quickly due to another incoming call.  Was unable to let patient know he was due for labs.  Verlin Fester, PharmD, [facility] Rheumatology Clinical Pharmacist  11/19/2018 4:02 PM

## 2018-11-26 ENCOUNTER — Telehealth: Payer: Self-pay | Admitting: Rheumatology

## 2018-11-26 NOTE — Telephone Encounter (Signed)
Patient called stating his insurance won't pay for his prescription of Folic Acid.  Patient is requesting a return call with an alternative medication.

## 2018-11-26 NOTE — Telephone Encounter (Signed)
Patient advised that we received his blood work drawn on 11/20/18. Patient advised to avoid NSAIDS per Dr. Corliss Skains.  WBC 3.8, Sed Rate 19, Glucose 105, Creat 1.5 GFR 58, CAl Osm 3.06  Patient advised that he may use goodrx[employer]com and with coupon at this time he can get a 90 day supply for $14.21 at his pharmacy [facility]. Patient verbalized understanding.

## 2018-12-12 ENCOUNTER — Other Ambulatory Visit: Payer: Self-pay

## 2018-12-12 DIAGNOSIS — M0579 Rheumatoid arthritis with rheumatoid factor of multiple sites without organ or systems involvement: Secondary | ICD-10-CM

## 2018-12-12 MED ORDER — HYDROXYCHLOROQUINE SULFATE 200 MG PO TABS: 200.0000 mg | ORAL_TABLET | Freq: Two times a day (BID) | ORAL | 0 refills | Status: DC

## 2018-12-12 NOTE — Progress Notes (Signed)
Okay to refill PLQ per Dr. Deveshwar. Patient has been notified.  

## 2019-01-01 ENCOUNTER — Other Ambulatory Visit: Payer: Self-pay | Admitting: Rheumatology

## 2019-01-01 NOTE — Telephone Encounter (Signed)
Last Visit: 10/17/18 Next Visit: 01/15/19 Labs: 11/20/18 WBC 3.8, Glucose 105, Creat 1.5 GFR 58  Okay to refill per Dr. Corliss Skains

## 2019-01-09 NOTE — Progress Notes (Signed)
Virtual Visit via Telephone Note  I connected with Charlott Rakes on 01/09/19 at  9:15 AM EDT by telephone and verified that I am speaking with the correct person using two identifiers.   I discussed the limitations, risks, security and privacy concerns of performing an evaluation and management service by telephone and the availability of in person appointments. I also discussed with the patient that there may be a patient responsible charge related to this service. The patient expressed understanding and agreed to proceed.  This service was conducted via virtual visit.   The patient was located at home. I was located in my office.  Consent was obtained prior to the virtual visit and is aware of possible charges through their insurance for this visit.  The patient is an established patient.  Dr. Corliss Skains, MD conducted the virtual visit and Sherron Ales, PA-C acted as scribed during the service.  Office staff helped with scheduling follow up visits after the service was conducted.   CC: Pain in both knee joints  History of Present Illness: Patient is a 58 year old male with a past medical history of seropositive rheumatoid arthritis and osteoarthritis.  He is on MTX 4 tablets po once weekly, folic acid 1 mg po daily, and PLQ 200 mg BID.   He experiences pain in both knee joints when walking.   He experiences stiffness in both knees when walking. He has intermittent swelling in both knee joints.  He takes Meloxicam twice weekly when he goes on a walk.  He denies any other joint pain or joint swelling.    Review of Systems  Constitutional: Negative for fever and malaise/fatigue.  Eyes: Negative for photophobia, pain, discharge and redness.  Respiratory: Negative for cough, shortness of breath and wheezing.   Cardiovascular: Negative for chest pain and palpitations.  Gastrointestinal: Negative for blood in stool, constipation and diarrhea.  Genitourinary: Negative for dysuria.   Musculoskeletal: Positive for joint pain. Negative for back pain, myalgias and neck pain.       +Morning stiffness  Skin: Negative for rash.  Neurological: Negative for dizziness and headaches.  Psychiatric/Behavioral: Negative for depression. The patient is not nervous/anxious and does not have insomnia.    Observations/Objective: Physical Exam  Constitutional: He is oriented to person, place, and time.  Neurological: He is alert and oriented to person, place, and time.  Psychiatric: Mood, memory, affect and judgment normal.   Patient reports morning stiffness for 30 minutes.   Patient denies nocturnal pain.  Difficulty dressing/grooming: Denies Difficulty climbing stairs: Reports Difficulty getting out of chair: Reports Difficulty using hands for taps, buttons, cutlery, and/or writing: Denies   Assessment and Plan:  Rheumatoid arthritis with rheumatoid factor of multiple sites without organ or systems involvement (HCC) - +CCP: He continues to have chronic pain in both knee joints.  He experiences stiffness and intermittent swelling in both knee joints after walking prolonged distances.  He has no other joint pain or joint swelling at this time.  He was started on PLQ 200 mg BID in January 2020.  He is unsure if he has noticed much improvement.  He continues to take MTX 4 tablets po once weekly and folic acid 1 mg po daily.  He is on low dose MTX due to elevated creatinine.  He continues to take Meloxicam twice weekly for pain relief when he goes on walks.  We discussed the importance of avoiding NSAIDs.  He can take tylenol for pain relief PRN.  He will  continue on the current treatment regimen.  He does not need any refills.  He will follow up in 3 months.   High risk medication use -He was started on PLQ 200 mg BID at last visit in January 2020. He continues to take MTX 4 tabs po once weekly and folic acid 1 mg po daily.  CBC and CMP were drawn on 11/21/18  Creatinine was 1.5 and GFR was  48. He continues to take Meloxicam twice weekly when he goes on a walk.  He was advised to avoid NSAIDs.   Primary osteoarthritis of both hands: He has no joint pain or joint swelling at this time.  Primary osteoarthritis of both knees: He continues to experience pain in both knee joints.  He experiences stiffness and intermittent swelling.    Primary osteoarthritis of both feet: He has no pain or swelling at this time.   Impingement syndrome of right shoulder - Status post right rotator cuff tear repair by Dr. Dion Saucier in September 2018.  He has no discomfort at this time.   DDD (degenerative disc disease), cervical -He has no discomfort at this time.  He was previously seeing a chiropractor on a regular basis and receiving acupuncture.  Follow Up Instructions: He will follow up in 3 months.  He is due for lab work in May and every 3 months.   I discussed the assessment and treatment plan with the patient. The patient was provided an opportunity to ask questions and all were answered. The patient agreed with the plan and demonstrated an understanding of the instructions.   The patient was advised to call back or seek an in-person evaluation if the symptoms worsen or if the condition fails to improve as anticipated.  I provided 25 minutes of non-face-to-face time during this encounter.  Pollyann Savoy, MD   Scribed by-  Gearldine Bienenstock, PA-C

## 2019-01-15 ENCOUNTER — Other Ambulatory Visit: Payer: Self-pay

## 2019-01-15 ENCOUNTER — Encounter: Payer: Self-pay | Admitting: Rheumatology

## 2019-01-15 ENCOUNTER — Telehealth (INDEPENDENT_AMBULATORY_CARE_PROVIDER_SITE_OTHER): Payer: Medicare Other | Admitting: Rheumatology

## 2019-01-15 DIAGNOSIS — M19071 Primary osteoarthritis, right ankle and foot: Secondary | ICD-10-CM

## 2019-01-15 DIAGNOSIS — Z79899 Other long term (current) drug therapy: Secondary | ICD-10-CM

## 2019-01-15 DIAGNOSIS — M19042 Primary osteoarthritis, left hand: Secondary | ICD-10-CM

## 2019-01-15 DIAGNOSIS — Z8669 Personal history of other diseases of the nervous system and sense organs: Secondary | ICD-10-CM

## 2019-01-15 DIAGNOSIS — Z8659 Personal history of other mental and behavioral disorders: Secondary | ICD-10-CM

## 2019-01-15 DIAGNOSIS — M0579 Rheumatoid arthritis with rheumatoid factor of multiple sites without organ or systems involvement: Secondary | ICD-10-CM

## 2019-01-15 DIAGNOSIS — M7541 Impingement syndrome of right shoulder: Secondary | ICD-10-CM

## 2019-01-15 DIAGNOSIS — M19072 Primary osteoarthritis, left ankle and foot: Secondary | ICD-10-CM

## 2019-01-15 DIAGNOSIS — M5030 Other cervical disc degeneration, unspecified cervical region: Secondary | ICD-10-CM

## 2019-01-15 DIAGNOSIS — M170 Bilateral primary osteoarthritis of knee: Secondary | ICD-10-CM

## 2019-01-15 DIAGNOSIS — M19041 Primary osteoarthritis, right hand: Secondary | ICD-10-CM

## 2019-01-15 DIAGNOSIS — Z8679 Personal history of other diseases of the circulatory system: Secondary | ICD-10-CM

## 2019-01-23 ENCOUNTER — Other Ambulatory Visit: Payer: Self-pay | Admitting: *Deleted

## 2019-01-23 DIAGNOSIS — Z79899 Other long term (current) drug therapy: Secondary | ICD-10-CM

## 2019-02-10 NOTE — Progress Notes (Signed)
Office Visit Note  Patient: Dylan Dudley             Date of Birth: 07/24/1961           MRN: [id number]             PCP: Elise BennePradhan, Pradeep K, MD Referring: Elise BennePradhan, Pradeep K, MD Visit Date: 02/12/2019 Occupation: @GUAROCC @  Subjective:  Pain in both knees   History of Present Illness: Dylan Dudley is a 58 y.o. male with history of seropositive rheumatoid arthritis, osteoarthritis, and DDD. He is taking PLQ 200 mg 1 tablet BID and MTX 4 tablets po once weekly.  He has not noticed any improvements in starting on Plaquenil and then adding on methotrexate.  He is on low-dose methotrexate due to elevated LFTs.  He continues to have severe pain and inflammation in bilateral knee joints.  He has been having increased pain in both first MTP joints.  He states the pain in his feet have been keeping him up at night.  He also has pain in both shoulder joints especially the right shoulder from previous injuries.  Activities of Daily Living:  Patient reports morning stiffness for 30 minutes.   Patient Reports nocturnal pain.  Difficulty dressing/grooming: Denies Difficulty climbing stairs: Reports Difficulty getting out of chair: Reports Difficulty using hands for taps, buttons, cutlery, and/or writing: Denies  Review of Systems  Constitutional: Negative for fatigue and night sweats.  HENT: Negative for mouth sores, mouth dryness and nose dryness.   Eyes: Negative for redness, visual disturbance and dryness.  Respiratory: Negative for cough, hemoptysis, shortness of breath and difficulty breathing.   Cardiovascular: Negative for chest pain, palpitations, hypertension, irregular heartbeat and swelling in legs/feet.  Gastrointestinal: Negative for blood in stool, constipation and diarrhea.  Endocrine: Negative for increased urination.  Genitourinary: Negative for painful urination.  Musculoskeletal: Positive for arthralgias, joint pain, joint swelling and morning stiffness. Negative  for myalgias, muscle weakness, muscle tenderness and myalgias.  Skin: Positive for rash. Negative for color change, hair loss, nodules/bumps, skin tightness, ulcers and sensitivity to sunlight.  Allergic/Immunologic: Negative for susceptible to infections.  Neurological: Negative for dizziness, fainting, memory loss, night sweats and weakness.  Hematological: Negative for swollen glands.  Psychiatric/Behavioral: Positive for depressed mood and sleep disturbance. The patient is nervous/anxious.     PMFS History:  Patient Active Problem List   Diagnosis Date Noted  . Rheumatoid arthritis with rheumatoid factor of multiple sites without organ or systems involvement (HCC) 09/10/2016  . Primary osteoarthritis of both hands 09/10/2016  . Primary osteoarthritis of both knees 09/10/2016  . Primary osteoarthritis of both feet 09/10/2016  . DJD (degenerative joint disease), cervical 09/10/2016  . History of tremor 09/10/2016  . History of sleep apnea 09/10/2016  . Coronary artery disease involving native heart 09/10/2016  . Smoker 09/10/2016  . High risk medication use 09/10/2016  . Tear of right rotator cuff 03/14/2012  . Impingement syndrome of right shoulder 03/14/2012  . AC (acromioclavicular) arthritis, right 03/14/2012  . Disorder of tendon of biceps 03/14/2012    Past Medical History:  Diagnosis Date  . AC (acromioclavicular) arthritis 03/14/2012  . Anxiety   . Arthritis    "shoulders; knees" (07/17/2013)  . DDD (degenerative disc disease)   . Depression   . Disorder of tendon of biceps 03/14/2012  . GERD (gastroesophageal reflux disease)   . Hyperlipemia   . Hypertension   . Impingement syndrome of right shoulder 03/14/2012  . Pneumonia 2012   "  once" (07/17/2013)  . Seasonal allergies   . Sleep apnea    "did test 2 months ago; get results in December" (07/17/2013)  . Tear of right rotator cuff 03/14/2012    Family History  Problem Relation Age of Onset  . Breast cancer  Mother   . Hypertension Mother   . Hypertension Father   . Multiple sclerosis Sister    Past Surgical History:  Procedure Laterality Date  . ANTERIOR CERVICAL DECOMP/DISCECTOMY FUSION  08/2011   [place]  . BUNIONECTOMY Bilateral 1998  . DEBRIDEMENT AND CLOSURE WOUND  12/2012   "back of my neck" (07/17/2013)  . DEBRIDEMENT AND CLOSURE WOUND N/A 07/17/2013   Procedure: DEBRIDEMENT  OF SKIN, SUBCUTANEOUS TISSUE BONE, MUSCLE FLAP, AND CLOSURE WOUND;  Surgeon: Glenna Fellows, MD;  Location: [facility] OR;  Service: Plastics;  Laterality: N/A;  . EXCISIONAL HEMORRHOIDECTOMY  ~ 1996  . INCISION AND DRAINAGE OF WOUND  10/2012 X 2   "back of my neck" (07/17/2013)  . MUSCLE FLAP CLOSURE  07/17/2013   "to back of my neck" (07/17/2013)  . NECK SURGERY  09/2012   "spur removed from back of my neck" (07/17/2013)  . SHOULDER ARTHROSCOPY Left 2008  . SHOULDER ARTHROSCOPY W/ ROTATOR CUFF REPAIR Right 2013   "and bicep repair" (07/17/2013)  . TONSILLECTOMY  ~ 5   Social History   Social History Narrative  . Not on file   Immunization History  Administered Date(s) Administered  . Influenza,inj,Quad PF,6+ Mos 07/18/2013  . Pneumococcal Polysaccharide-23 07/18/2013     Objective: Vital Signs: BP (!) 142/89 (BP Location: Right Arm, Patient Position: Sitting, Cuff Size: Large)   Pulse 64   Resp 15   Ht 6\' 2"  (1.88 m)   Wt 255 lb (115.7 kg)   BMI 32.74 kg/m    Physical Exam Vitals signs and nursing note reviewed.  Constitutional:      Appearance: He is well-developed.  HENT:     Head: Normocephalic and atraumatic.  Eyes:     Conjunctiva/sclera: Conjunctivae normal.     Pupils: Pupils are equal, round, and reactive to light.  Neck:     Musculoskeletal: Normal range of motion and neck supple.  Cardiovascular:     Rate and Rhythm: Normal rate and regular rhythm.     Heart sounds: Normal heart sounds.  Pulmonary:     Effort: Pulmonary effort is normal.     Breath sounds: Normal breath  sounds.  Abdominal:     General: Bowel sounds are normal.     Palpations: Abdomen is soft.  Lymphadenopathy:     Cervical: No cervical adenopathy.  Skin:    General: Skin is warm and dry.     Capillary Refill: Capillary refill takes less than 2 seconds.  Neurological:     Mental Status: He is alert and oriented to person, place, and time.  Psychiatric:        Behavior: Behavior normal.      Musculoskeletal Exam: C-spine limited range of motion.  Thoracic and lumbar spine good range of motion.  No midline spinal tenderness.  No SI joint tenderness.  Right shoulder limited abduction to 90 degrees.  Left shoulder full range of motion with no discomfort.  Elbow joints, wrist joints, MCPs and PIPs, DIPs good range of motion no synovitis.  Complete fist formation bilaterally.  Hip joints have good range of motion with no discomfort.  Painful range of motion of bilateral knee joints.  Right knee warmth noted.  Tenderness of  bilateral first MTP joints.  No tenderness or swelling of ankle joints.  CDAI Exam: CDAI Score: 5.6  Patient Global Assessment: 8 (mm); Provider Global Assessment: 8 (mm) Swollen: 1 ; Tender: 5  Joint Exam      Right  Left  Glenohumeral   Tender     Knee  Swollen Tender   Tender  MTP 1   Tender   Tender     Investigation: No additional findings.  Imaging: No results found.  Recent Labs: Lab Results  Component Value Date   WBC 4.2 12/18/2017   HGB 13.0 (L) 12/18/2017   PLT 243 12/18/2017   NA 136 12/18/2017   K 4.5 12/18/2017   CL 101 12/18/2017   CO2 28 12/18/2017   GLUCOSE 101 (H) 12/18/2017   BUN 24 12/18/2017   CREATININE 1.43 (H) 12/18/2017   BILITOT 0.5 12/18/2017   ALKPHOS 68 01/10/2017   AST 23 12/18/2017   ALT 23 12/18/2017   PROT 6.9 12/18/2017   ALBUMIN 4.6 01/10/2017   CALCIUM 9.5 12/18/2017   GFRAA 63 12/18/2017    Speciality Comments: PLQ Eye Exam: 11/19/2018 WNL @ [facility] Follow up in 1 year  Procedures:  No  procedures performed Allergies: Latex and Vancomycin   Assessment / Plan:     Visit Diagnoses: Rheumatoid arthritis with rheumatoid factor of multiple sites without organ or systems involvement (HCC) - +CCP: He has right knee warmth and tenderness of bilateral 1st MTP joints.  He has chronic pain in both knee joints.  He has not noticed any improvement since starting on PLQ 200 mg 1 tablet BID in January.  He has been on long term low-dose MTX due to elevated creatinine.  He would like to discontinue methotrexate and Plaquenil at this time.  We discussed starting on Arava 10 mg by mouth daily for 2 weeks and if labs are stable at that time he will increase to 20 mg a mouth daily.  Indications, contraindications, potential side effects of Arava were discussed.  All questions were addressed and consent was obtained.  The lab schedule was reviewed with the patient.  Standing orders are in place.  He was advised to notify us if he cannot tolerate taking Arava.  He will follow-up in the office in 3 months.  High risk medication use - Started on PLQ in January 2020,  low-dose methotrexate (4 tablets weekly) due to elevated creatinine.  Creatinine was 1.5 on 02/03/2019.  White blood cell count continues to trend down.  White blood cell count was 3.4 on 02/03/2019.  He would like to discontinue methotrexate and Plaquenil at this time.  He will be starting on Arava which she will take 10 mg by mouth daily for 2 weeks and return for lab work at that time.  If labs are stable he will increase to 20 mg by mouth daily.  Standing orders are in place.  Primary osteoarthritis of both hands: He has PIP and DIP synovial thickening consistent with osteoarthritis.  He has no tenderness or synovitis on exam.  Complete fist formation bilaterally.  Joint protection and muscle strengthening were discussed.   Primary osteoarthritis of both knees: Chronic pain.  He has right knee warmth but no effusion noted.  He has tenderness of  both knee joints.  He has full ROM with discomfort bilaterally.    Primary osteoarthritis of both feet: He has tenderness and synovial thickening of both 1st MTP joints.  Mild PIP and DIP synovial thickening  consistent with osteoarthritis of both feet.  No synovitis or dactylitis noted.   Impingement syndrome of right shoulder: He has chronic right shoulder joint pain.  He has limited abduction to 90 degrees.    DDD (degenerative disc disease), cervical: Limited ROM with discomfort.  No symptoms of radiculopathy at this time.   Other medical conditions are listed as follows:   History of coronary artery disease  History of depression  History of sleep apnea  History of tremor  Smoker   Orders: No orders of the defined types were placed in this encounter.  No orders of the defined types were placed in this encounter.   Face-to-face time spent with patient was 30 minutes. Greater than 50% of time was spent in counseling and coordination of care.  Follow-Up Instructions: Return in about 3 months (around 05/15/2019) for Rheumatoid arthritis.   Gearldine Bienenstockaylor M Nur Krasinski, PA-C   I examined and evaluated the patient with Sherron Alesaylor Edwyna Dangerfield PA.  Patient continues to have active disease with pain and swelling in joints as described above.  He believes that Plaquenil is not effective.  He could not tolerate methotrexate due to neutropenia and elevation of creatinine.  We discussed different treatment options and their side effects.  After indications side effects contraindications of leflunomide was reviewed he was in agreement to proceed with the medication.  He will be started on leflunomide 10 mg p.o. daily and will increase it to 20 mg p.o. daily the labs are stable.  We will continue to monitor close labs closely.  The plan of care was discussed as noted above.  Pollyann SavoyShaili Deveshwar, MD  Note - This record has been created using Animal [employer]istDragon software.  Chart creation errors have been sought, but may not always   have been located. Such creation errors do not reflect on  the standard of medical care.

## 2019-02-12 ENCOUNTER — Encounter: Payer: Self-pay | Admitting: Physician Assistant

## 2019-02-12 ENCOUNTER — Ambulatory Visit: Payer: Medicare Other | Admitting: Rheumatology

## 2019-02-12 ENCOUNTER — Other Ambulatory Visit: Payer: Self-pay

## 2019-02-12 ENCOUNTER — Encounter (INDEPENDENT_AMBULATORY_CARE_PROVIDER_SITE_OTHER): Payer: Self-pay

## 2019-02-12 VITALS — BP 142/89 | HR 64 | Resp 15 | Ht 74.0 in | Wt 255.0 lb

## 2019-02-12 DIAGNOSIS — M170 Bilateral primary osteoarthritis of knee: Secondary | ICD-10-CM

## 2019-02-12 DIAGNOSIS — M7541 Impingement syndrome of right shoulder: Secondary | ICD-10-CM

## 2019-02-12 DIAGNOSIS — F17200 Nicotine dependence, unspecified, uncomplicated: Secondary | ICD-10-CM

## 2019-02-12 DIAGNOSIS — Z8659 Personal history of other mental and behavioral disorders: Secondary | ICD-10-CM

## 2019-02-12 DIAGNOSIS — M0579 Rheumatoid arthritis with rheumatoid factor of multiple sites without organ or systems involvement: Secondary | ICD-10-CM

## 2019-02-12 DIAGNOSIS — M19071 Primary osteoarthritis, right ankle and foot: Secondary | ICD-10-CM

## 2019-02-12 DIAGNOSIS — Z8679 Personal history of other diseases of the circulatory system: Secondary | ICD-10-CM

## 2019-02-12 DIAGNOSIS — M19042 Primary osteoarthritis, left hand: Secondary | ICD-10-CM

## 2019-02-12 DIAGNOSIS — Z8669 Personal history of other diseases of the nervous system and sense organs: Secondary | ICD-10-CM

## 2019-02-12 DIAGNOSIS — Z79899 Other long term (current) drug therapy: Secondary | ICD-10-CM

## 2019-02-12 DIAGNOSIS — M19041 Primary osteoarthritis, right hand: Secondary | ICD-10-CM

## 2019-02-12 DIAGNOSIS — M19072 Primary osteoarthritis, left ankle and foot: Secondary | ICD-10-CM

## 2019-02-12 DIAGNOSIS — M5030 Other cervical disc degeneration, unspecified cervical region: Secondary | ICD-10-CM

## 2019-02-12 MED ORDER — LEFLUNOMIDE 10 MG PO TABS: ORAL_TABLET | ORAL | 0 refills | Status: DC

## 2019-02-12 NOTE — Patient Instructions (Addendum)
Standing Labs We placed an order today for your standing lab work.    Please come back and get your standing labs in 2 weeks x2, then every 3 months.   We have open lab Monday through Friday from 8:30-11:30 AM and 1:30-4:00 PM  at the office of Dr. Pollyann Savoy.   You may experience shorter wait times on Monday and Friday afternoons. The office is located at [address], [address], [place], [place] [address] No appointment is necessary.   Labs are drawn by [facility].  You may receive a bill from [facility] for your lab work.  If you wish to have your labs drawn at another location, please call the office 24 hours in advance to send orders.  If you have any questions regarding directions or hours of operation,  please call [phone].   Just as a reminder please drink plenty of water prior to coming for your lab work. Thanks!    Leflunomide tablets What is this medicine? LEFLUNOMIDE (le FLOO na mide) is for rheumatoid arthritis. This medicine may be used for other purposes; ask your health care provider or pharmacist if you have questions. COMMON BRAND NAME(S): Arava What should I tell my health care provider before I take this medicine? They need to know if you have any of these conditions: -diabetes -have a fever or infection -high blood pressure -immune system problems -kidney disease -liver disease -low blood cell counts, like low white cell, platelet, or red cell counts -lung or breathing disease, like asthma -recently received or scheduled to receive a vaccine -receiving treatment for cancer -skin conditions or sensitivity -tingling of the fingers or toes, or other nerve disorder -tuberculosis -an unusual or allergic reaction to leflunomide, teriflunomide, other medicines, food, dyes, or preservatives -pregnant or trying to get pregnant -breast-feeding How should I use this medicine? Take this medicine by mouth with a full glass of water. Follow the directions on  the prescription label. Take your medicine at regular intervals. Do not take your medicine more often than directed. Do not stop taking except on your doctor's advice. Talk to your pediatrician regarding the use of this medicine in children. Special care may be needed. Overdosage: If you think you have taken too much of this medicine contact a poison control center or emergency room at once. NOTE: This medicine is only for you. Do not share this medicine with others. What if I miss a dose? If you miss a dose, take it as soon as you can. If it is almost time for your next dose, take only that dose. Do not take double or extra doses. What may interact with this medicine? Do not take this medicine with any of the following medications: -teriflunomide This medicine may also interact with the following medications: -alosetron -birth control pills -caffeine -cefaclor -certain medicines for diabetes like nateglinide, repaglinide, rosiglitazone, pioglitazone -certain medicines for high cholesterol like atorvastatin, pravastatin, rosuvastatin, simvastatin -charcoal -cholestyramine -ciprofloxacin -duloxetine -furosemide -ketoprofen -live virus vaccines -medicines that increase your risk for infection -methotrexate -mitoxantrone -paclitaxel -penicillin -theophylline -tizanidine -warfarin This list may not describe all possible interactions. Give your health care provider a list of all the medicines, herbs, non-prescription drugs, or dietary supplements you use. Also tell them if you smoke, drink alcohol, or use illegal drugs. Some items may interact with your medicine. What should I watch for while using this medicine? Visit your healthcare professional for regular checks on your progress. Tell your doctor or healthcare professional if your symptoms do not  start to get better or if they get worse. You may need blood work done while you are taking this medicine. This medicine may stay in your  body for up to 2 years after your last dose. Tell your doctor about any unusual side effects or symptoms. A medicine can be given to help lower your blood levels of this medicine more quickly. Women must use effective birth control with this medicine. There is a potential for serious side effects to an unborn child. Do not become pregnant while taking this medicine. Inform your doctor if you wish to become pregnant. This medicine remains in your blood after you stop taking it. You must continue using effective birth control until the blood levels have been checked and they are low enough. A medicine can be given to help lower your blood levels of this medicine more quickly. Immediately talk to your doctor if you think you may be pregnant. You may need a pregnancy test. Talk to your health care professional or pharmacist for more information. You should not receive certain vaccines during your treatment and for a certain time after your treatment with this medication ends. Talk to your health care professional for more information. What side effects may I notice from receiving this medicine? Side effects that you should report to your doctor or health care professional as soon as possible: -allergic reactions like skin rash, itching or hives, swelling of the face, lips, or tongue -breathing problems -cough -increased blood pressure -low blood counts - this medicine may decrease the number of white blood cells and platelets. You may be at increased risk for infections and bleeding. -pain, tingling, numbness in the hands or feet -redness, blistering, peeing or loosening of the skin, including inside the mouth -signs of decreased platelets or bleeding - bruising, pinpoint red spots on the skin, black, tarry stools, blood in urine -signs of infection - fever or chills, cough, sore throat, pain or trouble passing urine -signs and symptoms of liver injury like dark yellow or brown urine; general ill feeling  or flu-like symptoms; light-colored stools; loss of appetite; nausea; right upper belly pain; unusually weak or tired; yellowing of the eyes or skin -trouble passing urine or change in the amount of urine -vomiting Side effects that usually do not require medical attention (report to your doctor or health care professional if they continue or are bothersome): -diarrhea -hair thinning or loss -headache -nausea -tiredness This list may not describe all possible side effects. Call your doctor for medical advice about side effects. You may report side effects to FDA at 1-800-FDA-1088. Where should I keep my medicine? Keep out of the reach of children. Store at room temperature between 15 and 30 degrees C (59 and 86 degrees F). Protect from moisture and light. Throw away any unused medicine after the expiration date. NOTE: This sheet is a summary. It may not cover all possible information. If you have questions about this medicine, talk to your doctor, pharmacist, or health care provider.  2019 Elsevier/Gold Standard (2016-11-06 13:49:00)

## 2019-02-13 ENCOUNTER — Other Ambulatory Visit: Payer: Self-pay | Admitting: *Deleted

## 2019-02-13 ENCOUNTER — Telehealth: Payer: Self-pay | Admitting: Rheumatology

## 2019-02-13 MED ORDER — LEFLUNOMIDE 10 MG PO TABS: ORAL_TABLET | ORAL | 0 refills | Status: DC

## 2019-02-13 NOTE — Telephone Encounter (Signed)
Contacted the pharmacy and spoke with the pharmacist. She states the insurance would not cover but one tablet daily. Advised them to fill Arava 10 mg 1 po daily # 14 with no refills.

## 2019-02-13 NOTE — Telephone Encounter (Signed)
Pharmacist at [facility] pharmacy called stating patient's insurance will only pay for one tablet per day (10 mg or 20 mg) and they cannot compromise the bottle and bottles come in quantities of 30.    If you have any questions, please call back at #[phone]

## 2019-02-13 NOTE — Telephone Encounter (Signed)
Patient is having trouble getting prescription filled at the pharmacy due to the way the prescription is written. Resent the prescription .

## 2019-03-02 ENCOUNTER — Telehealth: Payer: Self-pay | Admitting: Rheumatology

## 2019-03-02 NOTE — Telephone Encounter (Signed)
Patient called requesting labwork orders be faxed to his PCP Dr. Shon Millet at [phone].  Patient states he has an appointment on Wednesday, 03/04/19.

## 2019-03-03 ENCOUNTER — Other Ambulatory Visit: Payer: Self-pay

## 2019-03-03 DIAGNOSIS — Z79899 Other long term (current) drug therapy: Secondary | ICD-10-CM

## 2019-03-03 NOTE — Telephone Encounter (Signed)
Lab orders have been faxed to number provided by patient. Advised patient that lab orders have been faxed, he verbalized understanding.

## 2019-03-10 ENCOUNTER — Telehealth: Payer: Self-pay | Admitting: Rheumatology

## 2019-03-10 NOTE — Telephone Encounter (Signed)
Lab results have not been received. Advised patient and he will call his PCP and request they fax over again. Provided patient with fax number.

## 2019-03-10 NOTE — Telephone Encounter (Signed)
Lab results have not been received yet. I called Dr. Roosvelt Harps office to request lab results. They are faxing lab results over this morning.

## 2019-03-10 NOTE — Telephone Encounter (Signed)
Patient called checking if we received his labwork results.  Patient states he had his bloodwork at his appointment with Dr. Shon Millet last week.  Patient states he needed labwork in order to refill his prescription of Leflunomide.

## 2019-03-11 MED ORDER — LEFLUNOMIDE 20 MG PO TABS: 20.0000 mg | ORAL_TABLET | Freq: Every day | ORAL | 0 refills | Status: DC

## 2019-03-11 NOTE — Telephone Encounter (Signed)
Lab received via fax.   03/04/2019 ESR 23 Glucose 105 BUN 24 Calc osm 301  Okay to increase arava to '20mg'$  daily.   Reviewed by Hazel Sams, PA-C. Will send document to scan center.

## 2019-03-11 NOTE — Addendum Note (Signed)
Addended by: Earnestine Mealing on: 03/11/2019 08:39 AM   Modules accepted: Orders

## 2019-03-25 ENCOUNTER — Other Ambulatory Visit: Payer: Self-pay | Admitting: Rheumatology

## 2019-03-25 DIAGNOSIS — M0579 Rheumatoid arthritis with rheumatoid factor of multiple sites without organ or systems involvement: Secondary | ICD-10-CM

## 2019-04-16 ENCOUNTER — Ambulatory Visit: Payer: Self-pay | Admitting: Physician Assistant

## 2019-04-25 ENCOUNTER — Other Ambulatory Visit: Payer: Self-pay | Admitting: Rheumatology

## 2019-04-25 DIAGNOSIS — M0579 Rheumatoid arthritis with rheumatoid factor of multiple sites without organ or systems involvement: Secondary | ICD-10-CM

## 2019-04-30 NOTE — Progress Notes (Signed)
Office Visit Note  Patient: Dylan Dudley             Date of Birth: November 05, 1960           MRN: [id number]             PCP: Elise Benne, MD Referring: Elise Benne, MD Visit Date: 05/14/2019 Occupation: @GUAROCC @  Subjective:  Pain in both knee joints   History of Present Illness: Dylan Dudley is a 58 y.o. male with history of seropositive rheumatoid arthritis and osteoarthritis.  He is taking Arava 20 mg po daily.  He has been taking Arava for 3 months and has not noticed any improvement.  He previously had to discontinue methotrexate due to elevated creatinine.  He was then transitioned to Plaquenil which was ineffective.  He continues to have chronic pain in bilateral shoulder joints and bilateral knee joints.  He has intermittent swelling in both knees.  He had cortisone injections in bilateral knees in June 2020 performed by Dr. Dion Saucier.  He denies any pain in his hands or feet at this time.  He currently rates his pain a 7 out of 10.  He continues to have morning stiffness for several hours.  He has chronic neck and lower back pain.  He has not been taking anything for pain.   Activities of Daily Living:  Patient reports morning stiffness for several hours.   Patient Reports nocturnal pain.  Difficulty dressing/grooming: Denies Difficulty climbing stairs: Reports Difficulty getting out of chair: Reports Difficulty using hands for taps, buttons, cutlery, and/or writing: Denies  Review of Systems  Constitutional: Negative for fatigue.  HENT: Negative for mouth sores, mouth dryness and nose dryness.   Eyes: Negative for pain, itching and dryness.  Respiratory: Negative for shortness of breath, wheezing and difficulty breathing.   Cardiovascular: Negative for chest pain, palpitations and swelling in legs/feet.  Gastrointestinal: Negative for abdominal pain, blood in stool, constipation and diarrhea.  Endocrine: Negative for increased urination.   Genitourinary: Negative for difficulty urinating and painful urination.  Musculoskeletal: Positive for arthralgias, joint pain and morning stiffness. Negative for joint swelling.  Skin: Negative for rash and redness.  Allergic/Immunologic: Negative for susceptible to infections.  Neurological: Negative for dizziness, light-headedness, numbness, headaches, memory loss and weakness.  Hematological: Negative for bruising/bleeding tendency.  Psychiatric/Behavioral: Positive for depressed mood. Negative for confusion. The patient is nervous/anxious.     PMFS History:  Patient Active Problem List   Diagnosis Date Noted  . Rheumatoid arthritis with rheumatoid factor of multiple sites without organ or systems involvement (HCC) 09/10/2016  . Primary osteoarthritis of both hands 09/10/2016  . Primary osteoarthritis of both knees 09/10/2016  . Primary osteoarthritis of both feet 09/10/2016  . DJD (degenerative joint disease), cervical 09/10/2016  . History of tremor 09/10/2016  . History of sleep apnea 09/10/2016  . Coronary artery disease involving native heart 09/10/2016  . Smoker 09/10/2016  . High risk medication use 09/10/2016  . Tear of right rotator cuff 03/14/2012  . Impingement syndrome of right shoulder 03/14/2012  . AC (acromioclavicular) arthritis, right 03/14/2012  . Disorder of tendon of biceps 03/14/2012    Past Medical History:  Diagnosis Date  . AC (acromioclavicular) arthritis 03/14/2012  . Anxiety   . Arthritis    "shoulders; knees" (07/17/2013)  . DDD (degenerative disc disease)   . Depression   . Disorder of tendon of biceps 03/14/2012  . GERD (gastroesophageal reflux disease)   . Hyperlipemia   .  Hypertension   . Impingement syndrome of right shoulder 03/14/2012  . Pneumonia 2012   "once" (07/17/2013)  . Seasonal allergies   . Sleep apnea    "did test 2 months ago; get results in December" (07/17/2013)  . Tear of right rotator cuff 03/14/2012    Family History   Problem Relation Age of Onset  . Breast cancer Mother   . Hypertension Mother   . Hypertension Father   . Multiple sclerosis Sister    Past Surgical History:  Procedure Laterality Date  . ANTERIOR CERVICAL DECOMP/DISCECTOMY FUSION  08/2011   [place]  . BUNIONECTOMY Bilateral 1998  . DEBRIDEMENT AND CLOSURE WOUND  12/2012   "back of my neck" (07/17/2013)  . DEBRIDEMENT AND CLOSURE WOUND N/A 07/17/2013   Procedure: DEBRIDEMENT  OF SKIN, SUBCUTANEOUS TISSUE BONE, MUSCLE FLAP, AND CLOSURE WOUND;  Surgeon: Glenna Fellows, MD;  Location: [facility] OR;  Service: Plastics;  Laterality: N/A;  . EXCISIONAL HEMORRHOIDECTOMY  ~ 1996  . INCISION AND DRAINAGE OF WOUND  10/2012 X 2   "back of my neck" (07/17/2013)  . MUSCLE FLAP CLOSURE  07/17/2013   "to back of my neck" (07/17/2013)  . NECK SURGERY  09/2012   "spur removed from back of my neck" (07/17/2013)  . SHOULDER ARTHROSCOPY Left 2008  . SHOULDER ARTHROSCOPY W/ ROTATOR CUFF REPAIR Right 2013   "and bicep repair" (07/17/2013)  . TONSILLECTOMY  ~ 61   Social History   Social History Narrative  . Not on file   Immunization History  Administered Date(s) Administered  . Influenza,inj,Quad PF,6+ Mos 07/18/2013  . Pneumococcal Polysaccharide-23 07/18/2013     Objective: Vital Signs: BP (!) 142/97 (BP Location: Right Arm, Patient Position: Sitting, Cuff Size: Large)   Pulse 65   Resp 15   Ht 6\' 2"  (1.88 m)   Wt 262 lb (118.8 kg)   BMI 33.64 kg/m    Physical Exam Vitals signs and nursing note reviewed.  Constitutional:      Appearance: He is well-developed.  HENT:     Head: Normocephalic and atraumatic.  Eyes:     Conjunctiva/sclera: Conjunctivae normal.     Pupils: Pupils are equal, round, and reactive to light.  Neck:     Musculoskeletal: Normal range of motion and neck supple.  Cardiovascular:     Rate and Rhythm: Normal rate and regular rhythm.     Heart sounds: Normal heart sounds.  Pulmonary:     Effort: Pulmonary  effort is normal.     Breath sounds: Normal breath sounds.  Abdominal:     General: Bowel sounds are normal.     Palpations: Abdomen is soft.  Skin:    General: Skin is warm and dry.     Capillary Refill: Capillary refill takes less than 2 seconds.  Neurological:     Mental Status: He is alert and oriented to person, place, and time.  Psychiatric:        Behavior: Behavior normal.      Musculoskeletal Exam: C-spine limited range of motion with discomfort.  He has full range of motion of bilateral shoulders with discomfort.  Elbow joints, wrist joints, MCPs, PIPs, DIPs good range of motion with no synovitis.  He has complete fist formation bilaterally.  Hip joints have good range of motion with no discomfort.  He has painful range of motion of bilateral knees.  He has warmth and inflammation of bilateral knee joints on exam.  Ankle joints have good range of motion with no  warmth or effusion.  CDAI Exam: CDAI Score: 7.2  Patient Global: 7 mm; Provider Global: 5 mm Swollen: 2 ; Tender: 5  Joint Exam      Right  Left  Glenohumeral   Tender   Tender  Cervical Spine   Tender     Knee  Swollen Tender  Swollen Tender     Investigation: No additional findings.  Imaging: No results found.  Recent Labs: Lab Results  Component Value Date   WBC 4.2 12/18/2017   HGB 13.0 (L) 12/18/2017   PLT 243 12/18/2017   NA 136 12/18/2017   K 4.5 12/18/2017   CL 101 12/18/2017   CO2 28 12/18/2017   GLUCOSE 101 (H) 12/18/2017   BUN 24 12/18/2017   CREATININE 1.43 (H) 12/18/2017   BILITOT 0.5 12/18/2017   ALKPHOS 68 01/10/2017   AST 23 12/18/2017   ALT 23 12/18/2017   PROT 6.9 12/18/2017   ALBUMIN 4.6 01/10/2017   CALCIUM 9.5 12/18/2017   GFRAA 63 12/18/2017    Speciality Comments: PLQ Eye Exam: 11/19/2018 WNL @ [facility] Follow up in 1 year  Procedures:  No procedures performed Allergies: Codeine, Daptomycin, Hydroxyzine, Latex, and Vancomycin     Assessment / Plan:      Visit Diagnoses: Rheumatoid arthritis with rheumatoid factor of multiple sites without organ or systems involvement (HCC) - +CCP: He presents today with pain and inflammation in bilateral knee joints.  He continues to have pain in bilateral shoulder joints, neck, and lower back.  He has been taking Arava 20 mg 1 tablet by mouth daily for 3 months.  He has not noticed any improvement since starting on Arava.  He previously was taking methotrexate but had to continue to reduce the dose due to elevated creatinine and low GFR.  After discontinuing methotrexate he switched to Plaquenil in January 2020 which he found to be ineffective.  He does not want to make any changes at this time.  He will continue taking Arava 20 mg 1 tablet by mouth daily.  He does not need any refills at this time.  He was advised to notify us if he develops increased joint pain or joint swelling.  He will follow-up in the office in 5 months.  High risk medication use - Arava 20 mg po daily, D/c PLQ due to inadequate response and d/c low-dose methotrexate (4 tablets weekly) due to elevated creatinine.  CBC and CMP were drawn on 03/04/2019.  He will return for lab work in September and every 3 months.  Primary osteoarthritis of both hands: He has PIP and DIP synovial thickening consistent with osteoarthritis of bilateral hands.  He has no tenderness or synovitis on exam.  He has complete fist formation bilaterally.  He has no difficulties with ADLs.  Joint protection and muscle strengthening were discussed.  Primary osteoarthritis of both knees: He has chronic pain in bilateral knee joints.  He experiences discomfort and inflammation in both knees.  He had cortisone injections in both knee joints in June performed by Dr. Dion SaucierLandau.    Primary osteoarthritis of both feet: He has no feet pain at this time.   Impingement syndrome of right shoulder: Chronic pain.    DDD (degenerative disc disease), cervical: Chronic pain.  He has limited  range of motion with discomfort on exam.  He has no symptoms of radiculopathy at this time.  Other medical conditions are listed as follows:  History of coronary artery disease  History of depression  History of sleep apnea  History of tremor  Smoker  Orders: No orders of the defined types were placed in this encounter.  No orders of the defined types were placed in this encounter.     Follow-Up Instructions: Return in about 5 months (around 10/14/2019) for Rheumatoid arthritis, Osteoarthritis.   Ofilia Neas, PA-C  Note - This record has been created using Dragon software.  Chart creation errors have been sought, but may not always  have been located. Such creation errors do not reflect on  the standard of medical care.

## 2019-05-14 ENCOUNTER — Other Ambulatory Visit: Payer: Self-pay

## 2019-05-14 ENCOUNTER — Encounter: Payer: Self-pay | Admitting: Physician Assistant

## 2019-05-14 ENCOUNTER — Ambulatory Visit: Payer: Medicare Other | Admitting: Physician Assistant

## 2019-05-14 VITALS — BP 142/97 | HR 65 | Resp 15 | Ht 74.0 in | Wt 262.0 lb

## 2019-05-14 DIAGNOSIS — Z79899 Other long term (current) drug therapy: Secondary | ICD-10-CM

## 2019-05-14 DIAGNOSIS — Z8659 Personal history of other mental and behavioral disorders: Secondary | ICD-10-CM

## 2019-05-14 DIAGNOSIS — M19041 Primary osteoarthritis, right hand: Secondary | ICD-10-CM | POA: Diagnosis not present

## 2019-05-14 DIAGNOSIS — M0579 Rheumatoid arthritis with rheumatoid factor of multiple sites without organ or systems involvement: Secondary | ICD-10-CM | POA: Diagnosis not present

## 2019-05-14 DIAGNOSIS — M170 Bilateral primary osteoarthritis of knee: Secondary | ICD-10-CM | POA: Diagnosis not present

## 2019-05-14 DIAGNOSIS — Z8669 Personal history of other diseases of the nervous system and sense organs: Secondary | ICD-10-CM

## 2019-05-14 DIAGNOSIS — M19042 Primary osteoarthritis, left hand: Secondary | ICD-10-CM

## 2019-05-14 DIAGNOSIS — M19072 Primary osteoarthritis, left ankle and foot: Secondary | ICD-10-CM

## 2019-05-14 DIAGNOSIS — M7541 Impingement syndrome of right shoulder: Secondary | ICD-10-CM

## 2019-05-14 DIAGNOSIS — F17200 Nicotine dependence, unspecified, uncomplicated: Secondary | ICD-10-CM

## 2019-05-14 DIAGNOSIS — M5030 Other cervical disc degeneration, unspecified cervical region: Secondary | ICD-10-CM

## 2019-05-14 DIAGNOSIS — Z8679 Personal history of other diseases of the circulatory system: Secondary | ICD-10-CM

## 2019-05-14 DIAGNOSIS — M19071 Primary osteoarthritis, right ankle and foot: Secondary | ICD-10-CM

## 2019-05-14 NOTE — Patient Instructions (Signed)
Standing Labs We placed an order today for your standing lab work.    Please come back and get your standing labs in September and every 3 months  We have open lab daily Monday through Thursday from 8:30-12:30 PM and 1:30-4:30 PM and Friday from 8:30-12:30 PM and 1:30 -4:00 PM at the office of Dr. Shaili Deveshwar.   You may experience shorter wait times on Monday and Friday afternoons. The office is located at [address], [address], [place], [place] [address] No appointment is necessary.   Labs are drawn by [facility].  You may receive a bill from [facility] for your lab work.  If you wish to have your labs drawn at another location, please call the office 24 hours in advance to send orders.  If you have any questions regarding directions or hours of operation,  please call [phone].   Just as a reminder please drink plenty of water prior to coming for your lab work. Thanks!  

## 2019-06-02 ENCOUNTER — Other Ambulatory Visit: Payer: Self-pay | Admitting: *Deleted

## 2019-06-02 ENCOUNTER — Other Ambulatory Visit: Payer: Self-pay | Admitting: Rheumatology

## 2019-06-02 ENCOUNTER — Telehealth: Payer: Self-pay | Admitting: Rheumatology

## 2019-06-02 DIAGNOSIS — Z79899 Other long term (current) drug therapy: Secondary | ICD-10-CM

## 2019-06-02 NOTE — Telephone Encounter (Signed)
Lab Orders faxed

## 2019-06-02 NOTE — Telephone Encounter (Signed)
Last Visit: 05/14/19 Next Visit: 10/19/19 Labs: 03/04/19 Glucose 105 all other labs WNL  Patient updating labs at PCP office today.   Okay to refill per Dr. Estanislado Pandy

## 2019-06-02 NOTE — Telephone Encounter (Signed)
Patient called requesting his labwork orders be faxed to [facility], Attn:  Dr. Shon Millet [phone].  Patient states he has appointment today 06/02/19 at 8:45 am.  If you have any questions, please call back at [phone]

## 2019-06-03 ENCOUNTER — Other Ambulatory Visit: Payer: Self-pay | Admitting: Rheumatology

## 2019-06-03 NOTE — Telephone Encounter (Signed)
Last Visit: 05/14/19 Next Visit: 10/19/19  Okay to refill per Dr. Estanislado Pandy

## 2019-09-11 ENCOUNTER — Other Ambulatory Visit: Payer: Self-pay | Admitting: Rheumatology

## 2019-09-11 DIAGNOSIS — Z79899 Other long term (current) drug therapy: Secondary | ICD-10-CM

## 2019-09-11 NOTE — Telephone Encounter (Signed)
Last Visit: 05/14/2019 Next Visit: 10/19/2019 Labs: 06/02/2019 elevated glucose, Hgb 13.9  Advised patient he is due to update labs, patient verbalized understanding. Orders have been faxed to PCPs office, per patients request.   Okay to refill 30 day supply, per Dr. Estanislado Pandy.

## 2019-10-07 ENCOUNTER — Other Ambulatory Visit: Payer: Self-pay | Admitting: Rheumatology

## 2019-10-07 NOTE — Telephone Encounter (Addendum)
Last Visit: 05/14/19 Next Visit: 10/19/19 Labs: 06/02/2019 elevated glucose, Hgb 13.9  Patient states he has updated his labs and will call to have them faxed to our office.   Okay to refill 30 days supply per Dr. Corliss Skains

## 2019-10-12 NOTE — Progress Notes (Signed)
Virtual Visit via Telephone Note  I connected with Dylan Dudley on 10/19/19 at  9:15 AM EST by telephone and verified that I am speaking with the correct person using two identifiers.  Location: Patient: Home Provider: Clinic  This service was conducted via virtual visit.   The patient was located at home. I was located in my office.  Consent was obtained prior to the virtual visit and is aware of possible charges through their insurance for this visit.  The patient is an established patient.  Dr. Corliss Skains, MD conducted the virtual visit and Sherron Ales, PA-C acted as scribe during the service.  Office staff helped with scheduling follow up visits after the service was conducted.   I discussed the limitations, risks, security and privacy concerns of performing an evaluation and management service by telephone and the availability of in person appointments. I also discussed with the patient that there may be a patient responsible charge related to this service. The patient expressed understanding and agreed to proceed.  CC: Pain in both knee joints  History of Present Illness: Dylan Dudley is a 59 y.o. male with history of seropositive rheumatoid arthritis and osteoarthritis.  He is taking Arava 20 mg po daily.  He has persistent pain and inflammation in both knee joints.  He has not noticed any improvement since starting on Arava.  He has chronic pain in the right shoulder joint and neck. He would like to discuss other treatment options.    Review of Systems  Constitutional: Negative for fever and malaise/fatigue.  HENT: Negative for congestion.   Eyes: Negative for photophobia, pain, discharge and redness.  Respiratory: Negative for cough, shortness of breath and wheezing.   Cardiovascular: Negative for chest pain, palpitations and leg swelling.  Gastrointestinal: Negative for blood in stool, constipation and diarrhea.  Genitourinary: Negative for dysuria and urgency.   Musculoskeletal: Positive for joint pain and neck pain. Negative for back pain and myalgias.       +Joint swelling +Morning stiffness   Skin: Negative for rash.  Neurological: Negative for dizziness, weakness and headaches.  Endo/Heme/Allergies: Does not bruise/bleed easily.  Psychiatric/Behavioral: Negative for depression and memory loss. The patient is not nervous/anxious and does not have insomnia.      Observations/Objective:  Physical Exam  Constitutional: He is oriented to person, place, and time.  Neurological: He is alert and oriented to person, place, and time.  Psychiatric: Mood, memory, affect and judgment normal.    Patient reports morning stiffness for 30 minutes.   Patient reports nocturnal pain.  Difficulty dressing/grooming: Denies Difficulty climbing stairs: Reports Difficulty getting out of chair: Reports Difficulty using hands for taps, buttons, cutlery, and/or writing: Denies  Assessment and Plan: Visit Diagnoses: Rheumatoid arthritis with rheumatoid factor of multiple sites without organ or systems involvement (HCC) - +CCP: He has chronic pain and inflammation in both shoulder joints and both knee joints.  He has not noticed any improvement since starting on Arava 20 mg 1 tablet by mouth daily.  We discussed that he will require more aggressive combination therapy. He will not be a good candidate for anti-TNFs due to family history of MS (sister). Indications, contraindications, and potential side effects of Orencia were discussed.  We will apply for Orencia 125 mg sq injections once weekly.  He will come by the office tomorrow to update lab work and sign the consent form. He will continue taking arava as prescribed.  He will follow up in 6 weeks.  Medication counseling:  TB Gold: Order placed Hepatitis panel: Order placed HIV: Order placed SPEP: Order placed Immunoglobulins: Order placed   Does patient have a diagnosis of COPD? No  Counseled patient that  Maureen Chatters is a selective T-cell costimulation blocker indicated for rheumatoid arthritis.  Counseled patient on purpose, proper use, and adverse effects of Orencia. The most common adverse effects are increased risk of infections, headache, and injection site reactions.  There is the possibility of an increased risk of malignancy but it is not well understood if this increased risk is due to the medication or the disease state.  Reviewed the importance of regular labs while on Orencia therapy.  Counseled patient that Maureen Chatters should be held prior to scheduled surgery.  Counseled patient to avoid live vaccines while on Orencia.  Advised patient to get annual influenza vaccine and the pneumococcal vaccine as indicated.  Provided patient with medication education material and answered all questions.  Patient consented to [facility].  Will upload consent into patient's chart.  Will apply for Orencia through patient's insurance.  Reviewed storage information for Orencia.  Advised initial injection must be administered in office.    High risk medication use -We will apply for Orencia 125 mg sq injections once weekly. He will continue taking Arava 20 mg 1 tablet by mouth daily. D/c PLQ due to inadequate response and d/c low-dose methotrexate (4 tablets weekly) due to elevated creatinine.  He is overdue to update CBC and CMP.  Standing orders are in place.    Primary osteoarthritis of both hands: He is not having any pain or inflammation in his hands at this time.   Primary osteoarthritis of both knees: He has chronic pain and inflammation in both knee joints.  He has difficulty climbing steps and getting up from a chair.   Primary osteoarthritis of both feet: He has no feet pain or inflammation currently.   Impingement syndrome of right shoulder: He has chronic pain in both shoulders, worse in the right shoulder. He has limited ROM due to the discomfort.   DDD (degenerative disc disease), cervical: Chronic pain    Family history of multiple sclerosis: Sister.  He will not be a good candidate for anti-TNF therapy.    Other medical conditions are listed as follows:  History of coronary artery disease  History of depression  History of sleep apnea  History of tremor  Smoker   Follow Up Instructions: He will follow up in 6 weeks    I discussed the assessment and treatment plan with the patient. The patient was provided an opportunity to ask questions and all were answered. The patient agreed with the plan and demonstrated an understanding of the instructions.   The patient was advised to call back or seek an in-person evaluation if the symptoms worsen or if the condition fails to improve as anticipated.  I provided 25 minutes of non-face-to-face time during this encounter.  Bo Merino, MD    Scribed by-  Hazel Sams, PA-C

## 2019-10-15 ENCOUNTER — Telehealth: Payer: Self-pay | Admitting: *Deleted

## 2019-10-15 NOTE — Telephone Encounter (Signed)
Labs received from [facility], reviewed by Dr. Bo Merino Labs drawn 10/08/2019, all labs normal except the following:  Hgb 13.8 ESR 28 Glucose 147

## 2019-10-19 ENCOUNTER — Telehealth: Payer: Self-pay | Admitting: *Deleted

## 2019-10-19 ENCOUNTER — Other Ambulatory Visit: Payer: Self-pay

## 2019-10-19 ENCOUNTER — Encounter: Payer: Self-pay | Admitting: Rheumatology

## 2019-10-19 ENCOUNTER — Telehealth (INDEPENDENT_AMBULATORY_CARE_PROVIDER_SITE_OTHER): Payer: Medicare Other | Admitting: Rheumatology

## 2019-10-19 DIAGNOSIS — Z79899 Other long term (current) drug therapy: Secondary | ICD-10-CM

## 2019-10-19 DIAGNOSIS — M170 Bilateral primary osteoarthritis of knee: Secondary | ICD-10-CM

## 2019-10-19 DIAGNOSIS — M19042 Primary osteoarthritis, left hand: Secondary | ICD-10-CM

## 2019-10-19 DIAGNOSIS — M0579 Rheumatoid arthritis with rheumatoid factor of multiple sites without organ or systems involvement: Secondary | ICD-10-CM | POA: Diagnosis not present

## 2019-10-19 DIAGNOSIS — Z8659 Personal history of other mental and behavioral disorders: Secondary | ICD-10-CM

## 2019-10-19 DIAGNOSIS — M7541 Impingement syndrome of right shoulder: Secondary | ICD-10-CM

## 2019-10-19 DIAGNOSIS — M19071 Primary osteoarthritis, right ankle and foot: Secondary | ICD-10-CM

## 2019-10-19 DIAGNOSIS — M19041 Primary osteoarthritis, right hand: Secondary | ICD-10-CM | POA: Diagnosis not present

## 2019-10-19 DIAGNOSIS — Z8679 Personal history of other diseases of the circulatory system: Secondary | ICD-10-CM

## 2019-10-19 DIAGNOSIS — M5030 Other cervical disc degeneration, unspecified cervical region: Secondary | ICD-10-CM

## 2019-10-19 DIAGNOSIS — Z8669 Personal history of other diseases of the nervous system and sense organs: Secondary | ICD-10-CM

## 2019-10-19 DIAGNOSIS — M19072 Primary osteoarthritis, left ankle and foot: Secondary | ICD-10-CM

## 2019-10-19 DIAGNOSIS — F17200 Nicotine dependence, unspecified, uncomplicated: Secondary | ICD-10-CM

## 2019-10-19 DIAGNOSIS — Z820 Family history of epilepsy and other diseases of the nervous system: Secondary | ICD-10-CM

## 2019-10-19 NOTE — Telephone Encounter (Signed)
He is coming around 2:00 to have labs done and pick up his information. Thanks for letting me know.

## 2019-10-19 NOTE — Telephone Encounter (Signed)
Patient will come to office 10/20/2019 to discuss Orencia consent, handout and apply with you. Thank you.

## 2019-10-20 ENCOUNTER — Other Ambulatory Visit: Payer: Self-pay | Admitting: *Deleted

## 2019-10-20 DIAGNOSIS — Z79899 Other long term (current) drug therapy: Secondary | ICD-10-CM

## 2019-10-20 NOTE — Patient Instructions (Addendum)
We will schedule appointment for you first dose pending lab results and insurance approval.  You will need a follow up appointment 1 month after starting Orencia.  Standing Labs We placed an order today for your standing lab work.    Please come back and get your standing labs after starting Orencia in 1 month and then every 3 months.  We have open lab daily Monday through Thursday from 8:30-12:30 PM and 1:30-4:30 PM and Friday from 8:30-12:30 PM and 1:30-4:00 PM at the office of Dr. Pollyann Savoy.   You may experience shorter wait times on Monday and Friday afternoons. The office is located at [address], [place], [place] [address] No appointment is necessary.   Labs are drawn by [facility].  You may receive a bill from [facility] for your lab work.  If you wish to have your labs drawn at another location, please call the office 24 hours in advance to send orders.  If you have any questions regarding directions or hours of operation,  please call [phone].   Just as a reminder please drink plenty of water prior to coming for your lab work. Thanks!  Vaccines You are taking a medication(s) that can suppress your immune system.  The following immunizations are recommended: . Flu annually . Covid-19 . Pneumonia (Pneumovax 23 and Prevnar 13 spaced at least 1 year apart) . Shingrix  Please check with your PCP to make sure you are up to date.   Abatacept solution for injection (subcutaneous or intravenous use) What is this medicine? ABATACEPT (a ba TA sept) is used to treat moderate to severe active rheumatoid arthritis or psoriatic arthritis in adults. This medicine is also used to treat juvenile idiopathic arthritis. This medicine may be used for other purposes; ask your health care provider or pharmacist if you have questions. COMMON BRAND NAME(S): Orencia What should I tell my health care provider before I take this medicine? They need to know if you have any of  these conditions:  cancer  diabetes  hepatitis B or history of hepatitis B infection  immune system problems  infection or history of infection (especially a virus infection such as chickenpox, cold sores, or herpes)  lung or breathing problems, like chronic obstructive pulmonary disease (COPD)  recently received or scheduled to receive a vaccination  scheduled to have surgery  tuberculosis, a positive skin test for tuberculosis, or have recently been in close contact with someone who has tuberculosis  an unusual or allergic reaction to abatacept, other medicines, foods, dyes, or preservatives  pregnant or trying to get pregnant  breast-feeding How should I use this medicine? This medicine is for infusion into a vein or for injection under the skin. Infusions are given by a health care professional in a hospital or clinic setting. If you are to give your own medicine at home, you will be taught how to prepare and give this medicine under the skin. Use exactly as directed. Take your medicine at regular intervals. Do not take your medicine more often than directed. It is important that you put your used needles and syringes in a special sharps container. Do not put them in a trash can. If you do not have a sharps container, call your pharmacist or health care provider to get one. Talk to your pediatrician regarding the use of this medicine in children. While infusions in a clinic may be prescribed for children as young as 2 years for selected conditions, precautions do apply. Overdosage: If  you think you have taken too much of this medicine contact a poison control center or emergency room at once. NOTE: This medicine is only for you. Do not share this medicine with others. What if I miss a dose? This medicine is used once a week if given by injection under the skin. If you miss a dose, take it as soon as you can. If it is almost time for your next dose, take only that dose. Do not take  double or extra doses. If you are to be given an infusion of this medicine, it is important not to miss your dose. Doses are usually every 4 weeks. Call your doctor or health care professional if you are unable to keep an appointment. What may interact with this medicine? Do not take this medicine with any of the following medications:  live vaccines This medicine may also interact with the following medications:  anakinra  baricitinib  canakinumab  medicines that lower your chance of fighting an infection  rituximab  TNF blockers such as adalimumab, certolizumab, etanercept, golimumab, infliximab  tocilizumab  tofacitinib  upadacitinib  ustekinumab This list may not describe all possible interactions. Give your health care provider a list of all the medicines, herbs, non-prescription drugs, or dietary supplements you use. Also tell them if you smoke, drink alcohol, or use illegal drugs. Some items may interact with your medicine. What should I watch for while using this medicine? Visit your doctor for regular checks on your progress. Tell your doctor or health care professional if your symptoms do not start to get better or if they get worse. You will be tested for tuberculosis (TB) before you start this medicine. If your doctor prescribed any medicine for TB, you should start taking the TB medicine before starting this medicine. Make sure to finish the full course of TB medicine. This medicine may increase your risk of getting an infection. Call your doctor or health care professional if you get fever, chills, or sore throat, or other symptoms of a cold or flu. Do not treat yourself. Try to avoid being around people who are sick. If you have diabetes and are getting this medicine in a vein, the infusion can give false high blood sugar readings on the day of your dose. This may happen if you use certain types of blood glucose tests. Your health care provider may tell you to use a  different way to monitor your blood sugar levels. What side effects may I notice from receiving this medicine? Side effects that you should report to your doctor or health care professional as soon as possible:  allergic reactions like skin rash, itching or hives, swelling of the face, lips, or tongue  breathing problems  chest pain  dizziness  signs and symptoms of infection like fever; chills; cough; sore throat; pain or trouble passing urine  unusually weak or tired Side effects that usually do not require medical attention (report to your doctor or health care professional if they continue or are bothersome):  diarrhea  headache  nausea  pain, redness, or irritation at site where injected  stomach pain or upset This list may not describe all possible side effects. Call your doctor for medical advice about side effects. You may report side effects to FDA at 1-800-FDA-1088. Where should I keep my medicine? Infusions will be given in a hospital or clinic and will not be stored at home. Storage for syringes and autoinjectors stored at home: Keep out of the reach  of children. Store in a refrigerator between 2 and 8 degrees C (36 and 46 degrees F). Keep this medicine in the original container. Protect from light. Do not freeze. Do not shake. Throw away any unused medicine after the expiration date. NOTE: This sheet is a summary. It may not cover all possible information. If you have questions about this medicine, talk to your doctor, pharmacist, or health care provider.  2020 Elsevier/Gold Standard (2019-03-17 14:01:21)

## 2019-10-20 NOTE — Telephone Encounter (Signed)
Please start BIV for Orencia for rheumatoid arthritis.  He has tried and failed Plaquenil, methotrexate, Arava.  He is not a candidate for anti-TNF's due to family history of MS.  Patient has Medicare and will likely need patient assistance.  He is coming to the office today for labs and sign consent.  Placed patient assistance application with medication information.   Verlin Fester, PharmD, [place], CPP Clinical Specialty Pharmacist [phone]  10/20/2019 1:17 PM

## 2019-10-20 NOTE — Progress Notes (Signed)
Pharmacy Note  Subjective:   Patient seen by the pharmacist for counseling on subcutaneous Orencia rheumatoid arthritis.  Prior therapy includes:Arava, methotrexate, Plaquenil with inadequate response.   Objective: CBC: within normal limits from labs at PCP on 10/11/2019  CMP: within normal limits from labs at PCP on 10/11/2019   Baseline Immunosuppressant Therapy Labs TB GOLD: pending 10/20/2019  Hepatitis Panel: pending 10/20/2019  HIV: pending 10/20/2019  Immunoglobulins: pending 10/20/2019  SPEP: pending 10/20/2019  G6PD No results found for: G6PDH TPMT No results found for: TPMT   Chest x-ray: no active cardiopulmonary disease  Does patient have a diagnosis of COPD? No  Does patient have history of diverticulitis?  No  Assessment/Plan:  Counseled patient that Dub Amis is a selective T-cell costimulation blocker.  Counseled patient on purpose, proper use, and adverse effects of Orencia. The most common adverse effects are increased risk of infections, headache, and injection site reactions.  There is the possibility of an increased risk of malignancy but it is not well understood if this increased risk is due to the medication or the disease state. Reviewed risk of GI perforation which is higher in patients with diverticulitis and diabetes.  Counseled patient that Dub Amis should be held prior to scheduled surgery.  Counseled patient to avoid live vaccines while on Orencia.  Recommend annual influenza, Pneumovax 23, Prevnar 13, and Shingrix as indicated.   Reviewed the importance of regular labs while on Orencia therapy. Patient will be due for labs 1 month after starting therapy. Standing orders placed. Provided patient with medication education material and answered all questions.  Patient consented to [facility].  Will upload consent into patient's chart.  Will apply for Orencia through patient's insurance.  Reviewed storage information for Orencia.  Advised initial injection must be  administered in office.    Patient dose will be Orencia 125 mg every 7 days.  Prescription pending lab results and/or insurance approval.  All questions encouraged and answered.  Instructed patient to call with any other questions or concerns.   Verlin Fester, PharmD, [place], CPP Clinical Specialty Pharmacist [phone]  10/20/2019 2:04 PM

## 2019-10-20 NOTE — Telephone Encounter (Signed)
Submitted a Prior Authorization request to [facility] for [facility] via Cover My Meds. Will update once we receive a response.  (Key: [id number]) - [id number]  Dorthula Nettles, CPhT 1:41 PM

## 2019-10-21 NOTE — Telephone Encounter (Signed)
Received notification from [facility] regarding a prior authorization for [facility]. Authorization has been APPROVED from 07/23/19 to 10/20/20.   Will send document to scan center.  Authorization # [id number] Phone # [phone]  Ran test claim for 1 month of Orencia, patient's copay is $70.00. Pharmacy claim did not show any restrictions. Patient could fill through [facility]  1:03 PM Dorthula Nettles, CPhT

## 2019-10-22 NOTE — Telephone Encounter (Signed)
Called to notify patient of approval and co-pay of $70.  He is unable to use a co-pay card and will likely not qualify for patient assistance.  Patient verbalized understanding.  Advised that medication must be filled through a specialty pharmacy.  Discussed specialty pharmacies in the process for shifting medication.  He prefers to fill through [facility].  Will assist patient and set up with [facility] new start visit.  His baseline labs are still pending.  Advised patient that we will call when labs have resulted and schedule an appointment to administer his first dose.  All questions encouraged and answered.  Instructed patient to call with any other questions or concerns.  Verlin Fester, PharmD, [place], CPP Clinical Specialty Pharmacist [phone]  10/22/2019 9:01 AM

## 2019-10-24 LAB — HIV ANTIBODY (ROUTINE TESTING W REFLEX): HIV 1&2 Ab, 4th Generation: NONREACTIVE

## 2019-10-24 LAB — QUANTIFERON-TB GOLD PLUS
Mitogen-NIL: >10 IU/mL
NIL: 0.05 IU/mL
QuantiFERON-TB Gold Plus: NEGATIVE
TB1-NIL: 0.01 IU/mL
TB2-NIL: 0 IU/mL

## 2019-10-24 LAB — PROTEIN ELECTROPHORESIS, SERUM, WITH REFLEX
Albumin ELP: 3.8 g/dL (ref 3.8–4.8)
Alpha 1: 0.3 g/dL (ref 0.2–0.3)
Alpha 2: 0.9 g/dL (ref 0.5–0.9)
Beta 2: 0.5 g/dL (ref 0.2–0.5)
Beta Globulin: 0.4 g/dL (ref 0.4–0.6)
Gamma Globulin: 1.2 g/dL (ref 0.8–1.7)
Total Protein: 7.2 g/dL (ref 6.1–8.1)

## 2019-10-24 LAB — HEPATITIS PANEL, ACUTE
Hep A IgM: NONREACTIVE
Hep B C IgM: NONREACTIVE
Hepatitis B Surface Ag: NONREACTIVE
Hepatitis C Ab: NONREACTIVE
SIGNAL TO CUT-OFF: 0.03 (ref <1.00)

## 2019-10-24 LAB — IGG, IGA, IGM
IgG (Immunoglobin G), Serum: 1259 mg/dL (ref 600–1640)
IgM, Serum: 52 mg/dL (ref 50–300)
Immunoglobulin A: 321 mg/dL — ABNORMAL HIGH (ref 47–310)

## 2019-10-26 NOTE — Progress Notes (Signed)
HIV negative. Hepatitis panel negative.  TB gold negative.  SPEP-no abnormal proteins. IgA borderline elevated.  Rest of immunoglobulins WNL.

## 2019-10-31 ENCOUNTER — Other Ambulatory Visit: Payer: Self-pay | Admitting: Rheumatology

## 2019-11-02 NOTE — Telephone Encounter (Signed)
Last Visit: 10/19/19 Next Visit: 12/08/19 Labs: 10/08/19 Hgb 13.8 ESR 28 Glucose 147  Okay to refill per Dr. Estanislado Pandy

## 2019-11-17 NOTE — Telephone Encounter (Signed)
Going through patient assistance folder and came across application.  He would not qualify for patient assistance so application was not faxed.  Reviewed chart notes and we were to schedule an appointment pending lab results.  He was called with lab results but an appointment was not schedule.  Called patient and apologized for the delay.  Patient scheduled for new start visit 11/19/19 at 10 am.   Verlin Fester, PharmD, [place], CPP Clinical Specialty Pharmacist [phone]  11/17/2019 1:40 PM

## 2019-11-19 ENCOUNTER — Ambulatory Visit (INDEPENDENT_AMBULATORY_CARE_PROVIDER_SITE_OTHER): Payer: Medicare Other | Admitting: Pharmacist

## 2019-11-19 ENCOUNTER — Other Ambulatory Visit: Payer: Self-pay

## 2019-11-19 VITALS — BP 131/79 | HR 82

## 2019-11-19 DIAGNOSIS — M0579 Rheumatoid arthritis with rheumatoid factor of multiple sites without organ or systems involvement: Secondary | ICD-10-CM | POA: Diagnosis not present

## 2019-11-19 MED ORDER — ORENCIA CLICKJECT 125 MG/ML ~~LOC~~ SOAJ: 1.0000 "pen " | SUBCUTANEOUS | 2 refills | Status: DC

## 2019-11-19 NOTE — Patient Instructions (Signed)
Helpful Tips for Injecting To help alleviate pain and injection site reactions consider the following tips:  . Placing something cold (like and ice gel pack or cold water bottle) on the injection site just before cleansing with alcohol may help reduce pain . If you have a localized reaction (redness, mild swelling, warmth, and itching) you can use topical corticosteroids (hydrocortisone cream) or antihistamine (Claritin) to help minimize reaction the day of, the day before, and the day after injecting . Always inject this medication at room temperature (remove from refrigerator 15-20 minutes before injecting; this may help eliminate stinging). . Always rotate your injection sites using inside/outside thigh of both legs, abdomen divided into 4 quadrants (stay away from waist line, and 2" away from navel). . Do not inject into areas where skin is tender, bruised, red, or hard or where there are scars or stretch marks. . Always let alcohol dry on the skin before injecting. . Place a cold, damp towel or small ice pack on the injection site for 10 or 15 minutes every 1 to 2 hours if it hurts or is swollen.

## 2019-11-19 NOTE — Progress Notes (Signed)
Pharmacy Note  Subjective:   Patient is being initiated on Orencia.  Patient was previously counseled extensively on and consented to initiation of Orencia at that time.  Patient presents to clinic today to receive the first dose of Orencia.   Prior therapy: methotrexate (inadequate) and leflunomide (inadequate response).   Objective: CMP     Component Value Date/Time   NA 136 12/18/2017 0849   NA 139 01/10/2017 0000   K 4.5 12/18/2017 0849   CL 101 12/18/2017 0849   CO2 28 12/18/2017 0849   GLUCOSE 101 (H) 12/18/2017 0849   BUN 24 12/18/2017 0849   BUN 20 01/10/2017 0000   CREATININE 1.43 (H) 12/18/2017 0849   CALCIUM 9.5 12/18/2017 0849   PROT 7.2 10/20/2019 1410   PROT 7.3 01/10/2017 0000   ALBUMIN 4.6 01/10/2017 0000   AST 23 12/18/2017 0849   ALT 23 12/18/2017 0849   ALKPHOS 68 01/10/2017 0000   BILITOT 0.5 12/18/2017 0849   BILITOT 0.7 01/10/2017 0000   GFRNONAA 54 (L) 12/18/2017 0849   GFRAA 63 12/18/2017 0849    CBC    Component Value Date/Time   WBC 4.2 12/18/2017 0849   RBC 4.00 (L) 12/18/2017 0849   HGB 13.0 (L) 12/18/2017 0849   HGB 13.8 01/10/2017 0000   HCT 38.1 (L) 12/18/2017 0849   HCT 41.3 01/10/2017 0000   PLT 243 12/18/2017 0849   PLT 301 01/10/2017 0000   MCV 95.3 12/18/2017 0849   MCV 95 01/10/2017 0000   MCH 32.5 12/18/2017 0849   MCHC 34.1 12/18/2017 0849   RDW 13.6 12/18/2017 0849   RDW 15.1 01/10/2017 0000   LYMPHSABS 928 12/18/2017 0849   LYMPHSABS 1.0 01/10/2017 0000   MONOABS 352 09/11/2016 0924   EOSABS 109 12/18/2017 0849   EOSABS 0.1 01/10/2017 0000   BASOSABS 29 12/18/2017 0849   BASOSABS 0.0 01/10/2017 0000    Baseline Immunosuppressant Therapy Labs TB GOLD Quantiferon TB Gold Latest Ref Rng & Units 10/20/2019  Quantiferon TB Gold Plus NEGATIVE NEGATIVE   Hepatitis Panel Hepatitis Latest Ref Rng & Units 10/20/2019  Hep B Surface Ag NON-REACTI NON-REACTIVE  Hep B IgM NON-REACTI NON-REACTIVE  Hep C Ab NON-REACTI  NON-REACTIVE  Hep C Ab NON-REACTI NON-REACTIVE  Hep A IgM NON-REACTI NON-REACTIVE   HIV Lab Results  Component Value Date   HIV NON-REACTIVE 10/20/2019   Immunoglobulins Immunoglobulin Electrophoresis Latest Ref Rng & Units 10/20/2019  IgA  47 - 310 mg/dL 321(H)  IgG 600 - 1,640 mg/dL 1,259  IgM 50 - 300 mg/dL 52   SPEP Serum Protein Electrophoresis Latest Ref Rng & Units 10/20/2019  Total Protein 6.1 - 8.1 g/dL 7.2  Albumin 3.8 - 4.8 g/dL 3.8  Alpha-1 0.2 - 0.3 g/dL 0.3  Alpha-2 0.5 - 0.9 g/dL 0.9  Beta Globulin 0.4 - 0.6 g/dL 0.4  Beta 2 0.2 - 0.5 g/dL 0.5  Gamma Globulin 0.8 - 1.7 g/dL 1.2   G6PD No results found for: G6PDH TPMT No results found for: TPMT   Chest x-ray: no active cardiopulmonary disease  Patient running a fever or have signs/symptoms of infection? No  Assessment/Plan:  Demonstrated proper injection technique with Orencia Clickjet demo pen.  Patient able to demonstrate proper injection technique using the teach back method.  Patient self injected in the lower right abdomen with:  Sample Medication: Orencia Clickject Lot: [id number] Expiration: 10/2020  Patient tolerated well.  Observed for 30 mins in office for adverse reaction and none noted.  Patient is to return in 1 month for labs and follow up appointment.  Standing orders placed. Prescription sent to [facility] per patient's request.  All questions encouraged and answered.  Instructed patient to call with any further questions or concerns.  Verlin Fester, PharmD, [facility] Rheumatology Clinical Pharmacist  11/19/2019 8:12 AM

## 2019-11-24 MED FILL — ORENCIA CLICKJECT 125 MG/ML: 125 | 28 days supply | Qty: 4 | Fill #0

## 2019-12-08 ENCOUNTER — Ambulatory Visit: Payer: Medicare Other | Admitting: Rheumatology

## 2019-12-09 NOTE — Progress Notes (Signed)
Office Visit Note  Patient: Dylan Dudley             Date of Birth: Jun 10, 1961           MRN: [id number]             PCP: Elise Benne, MD Referring: Elise Benne, MD Visit Date: 12/17/2019 Occupation: @GUAROCC @  Subjective:  Medication monitoring   History of Present Illness: Dylan Dudley is a 59 y.o. male with history of seropositive rheumatoid arthritis.  He started on Orencia sq injections once weekly on 11/19/19.  He is no longer taking Arava.  He has been tolerating Orencia and has not had any injection site reactions.  He has not missed any doses.  He is due for his fourth injection tomorrow.  He denies any infections.  He has received both COVID-19 vaccinations.  He states he has not noticed much improvement since starting on Orencia 1 month ago.  He states that he was evaluated by Dr. 11/21/19 last week who performed a IM steroid injection and he was given a steroid taper which improved his symptoms significantly.  He continues to have chronic pain in both shoulder joints, both knee joints, and lower back.  He denies any joint swelling at this time.   Activities of Daily Living:  Patient reports morning stiffness for 15 minutes.   Patient Denies nocturnal pain.  Difficulty dressing/grooming: Denies Difficulty climbing stairs: Reports Difficulty getting out of chair: Reports Difficulty using hands for taps, buttons, cutlery, and/or writing: Denies  Review of Systems  Constitutional: Negative for fatigue and night sweats.  HENT: Negative for mouth sores, mouth dryness and nose dryness.   Eyes: Negative for redness, itching and dryness.  Respiratory: Negative for cough, hemoptysis, shortness of breath, wheezing and difficulty breathing.   Cardiovascular: Negative for chest pain, palpitations, hypertension, irregular heartbeat and swelling in legs/feet.  Gastrointestinal: Negative for blood in stool, constipation and diarrhea.  Genitourinary: Negative for  difficulty urinating and painful urination.  Musculoskeletal: Positive for arthralgias, joint pain and morning stiffness. Negative for joint swelling, myalgias, muscle weakness, muscle tenderness and myalgias.  Skin: Negative for color change, rash, hair loss, nodules/bumps, redness, skin tightness, ulcers and sensitivity to sunlight.  Allergic/Immunologic: Negative for susceptible to infections.  Neurological: Negative for dizziness, fainting, numbness, headaches, memory loss, night sweats and weakness.  Hematological: Negative for bruising/bleeding tendency and swollen glands.  Psychiatric/Behavioral: Negative for depressed mood, confusion and sleep disturbance. The patient is not nervous/anxious.     PMFS History:  Patient Active Problem List   Diagnosis Date Noted  . Rheumatoid arthritis with rheumatoid factor of multiple sites without organ or systems involvement (HCC) 09/10/2016  . Primary osteoarthritis of both hands 09/10/2016  . Primary osteoarthritis of both knees 09/10/2016  . Primary osteoarthritis of both feet 09/10/2016  . DJD (degenerative joint disease), cervical 09/10/2016  . History of tremor 09/10/2016  . History of sleep apnea 09/10/2016  . Coronary artery disease involving native heart 09/10/2016  . Smoker 09/10/2016  . High risk medication use 09/10/2016  . Tear of right rotator cuff 03/14/2012  . Impingement syndrome of right shoulder 03/14/2012  . AC (acromioclavicular) arthritis, right 03/14/2012  . Disorder of tendon of biceps 03/14/2012    Past Medical History:  Diagnosis Date  . AC (acromioclavicular) arthritis 03/14/2012  . Anxiety   . Arthritis    "shoulders; knees" (07/17/2013)  . DDD (degenerative disc disease)   . Depression   . Disorder  of tendon of biceps 03/14/2012  . GERD (gastroesophageal reflux disease)   . Hyperlipemia   . Hypertension   . Impingement syndrome of right shoulder 03/14/2012  . Pneumonia 2012   "once" (07/17/2013)  .  Seasonal allergies   . Sleep apnea    "did test 2 months ago; get results in December" (07/17/2013)  . Tear of right rotator cuff 03/14/2012    Family History  Problem Relation Age of Onset  . Breast cancer Mother   . Hypertension Mother   . Hypertension Father   . Multiple sclerosis Sister    Past Surgical History:  Procedure Laterality Date  . ANTERIOR CERVICAL DECOMP/DISCECTOMY FUSION  08/2011   [place]  . BUNIONECTOMY Bilateral 1998  . DEBRIDEMENT AND CLOSURE WOUND  12/2012   "back of my neck" (07/17/2013)  . DEBRIDEMENT AND CLOSURE WOUND N/A 07/17/2013   Procedure: DEBRIDEMENT  OF SKIN, SUBCUTANEOUS TISSUE BONE, MUSCLE FLAP, AND CLOSURE WOUND;  Surgeon: Glenna Fellows, MD;  Location: [facility] OR;  Service: Plastics;  Laterality: N/A;  . EXCISIONAL HEMORRHOIDECTOMY  ~ 1996  . INCISION AND DRAINAGE OF WOUND  10/2012 X 2   "back of my neck" (07/17/2013)  . MUSCLE FLAP CLOSURE  07/17/2013   "to back of my neck" (07/17/2013)  . NECK SURGERY  09/2012   "spur removed from back of my neck" (07/17/2013)  . SHOULDER ARTHROSCOPY Left 2008  . SHOULDER ARTHROSCOPY W/ ROTATOR CUFF REPAIR Right 2013   "and bicep repair" (07/17/2013)  . TONSILLECTOMY  ~ 79   Social History   Social History Narrative  . Not on file   Immunization History  Administered Date(s) Administered  . Influenza,inj,Quad PF,6+ Mos 07/18/2013  . Pneumococcal Polysaccharide-23 07/18/2013  . Tdap 11/28/2006, 04/30/2014     Objective: Vital Signs: BP (!) 141/99 (BP Location: Left Arm, Patient Position: Sitting, Cuff Size: Large)   Pulse 85   Resp 17   Ht 6\' 2"  (1.88 m)   Wt 263 lb 14.4 oz (119.7 kg)   BMI 33.88 kg/m    Physical Exam Vitals and nursing note reviewed.  Constitutional:      Appearance: He is well-developed.  HENT:     Head: Normocephalic and atraumatic.  Eyes:     Conjunctiva/sclera: Conjunctivae normal.     Pupils: Pupils are equal, round, and reactive to light.  Pulmonary:      Effort: Pulmonary effort is normal.  Abdominal:     General: Bowel sounds are normal.     Palpations: Abdomen is soft.  Musculoskeletal:     Cervical back: Normal range of motion and neck supple.  Skin:    General: Skin is warm and dry.     Capillary Refill: Capillary refill takes less than 2 seconds.  Neurological:     Mental Status: He is alert and oriented to person, place, and time.  Psychiatric:        Behavior: Behavior normal.      Musculoskeletal Exam: C-spine limited ROM.  Painful and limited ROM of lumbar spine. Shoulder joints have good ROM with some discomfort bilaterally.   Elbow joints, wrist joints, MCPs, PIPs, and DIPs good ROM with no synovitis. Complete fist formation bilaterally.  Hip joints, knee joints, ankle joints, MTPs, PIPs, and DIPs good ROM with no synovitis. Warmth of the right knee joint.  Painful ROM of both knee joints. No tenderness or swelling of ankle joints.    CDAI Exam: CDAI Score: 0.8  Patient Global: 4 mm; Provider Global: 4  mm Swollen: 0 ; Tender: 0  Joint Exam 12/17/2019   No joint exam has been documented for this visit   There is currently no information documented on the homunculus. Go to the Rheumatology activity and complete the homunculus joint exam.  Investigation: No additional findings.  Imaging: No results found.  Recent Labs: Lab Results  Component Value Date   WBC 4.2 12/18/2017   HGB 13.0 (L) 12/18/2017   PLT 243 12/18/2017   NA 136 12/18/2017   K 4.5 12/18/2017   CL 101 12/18/2017   CO2 28 12/18/2017   GLUCOSE 101 (H) 12/18/2017   BUN 24 12/18/2017   CREATININE 1.43 (H) 12/18/2017   BILITOT 0.5 12/18/2017   ALKPHOS 68 01/10/2017   AST 23 12/18/2017   ALT 23 12/18/2017   PROT 7.2 10/20/2019   ALBUMIN 4.6 01/10/2017   CALCIUM 9.5 12/18/2017   GFRAA 63 12/18/2017   QFTBGOLDPLUS NEGATIVE 10/20/2019    Speciality Comments: PLQ Eye Exam: 11/19/2018 WNL @ [facility] Follow up in 1 year  Procedures:   No procedures performed Allergies: Codeine, Daptomycin, Hydroxyzine, Latex, and Vancomycin   Assessment / Plan:     Visit Diagnoses: Rheumatoid arthritis with rheumatoid factor of multiple sites without organ or systems involvement ([place]) - +CCP: He has no synovitis on exam.  He has not had any recent rheumatoid arthritis flares.  He was started on Orencia 125 mg subcutaneous injections on 11/19/2019.  He has been tolerating Orencia without any injection site reactions.  He has not missed any doses.  He is due for his fourth injection tomorrow.  He has not noticed much difference from switching from [place] to [place] yet.  He was prescribed a steroid taper by Dr. Mardelle Matte about 2 weeks ago which improved his symptoms significantly.  He continues to have chronic pain in both shoulder joints and both knee joints.  He has warmth in the right knee joint on exam.  He will continue on Orencia 125 mg of days injections once weekly.  He was advised to notify us if he develops increased joint pain or joint swelling.  He will follow-up in the office in 2-3 months.  He was also advised to resume [place].  High risk medication use - Orencia 125 mg sq injections once weekly.  He had an inadequate response to arrival and discontinued.  D/c PLQ due to inadequate response and d/c low-dose methotrexate (4 tablets weekly) due to elevated creatinine.  He has not a good candidate for anti-TNF inhibitors due to family history of MS (sister).  CBC and CMP will be drawn today to monitor for drug toxicity.  He will return for lab work in June and every 3 months.  TB gold negative on 10/20/2019.- Plan: CBC with Differential/Platelet, COMPLETE METABOLIC PANEL WITH GFR  Primary osteoarthritis of both hands: He has PIP and DIP thickening consistent with osteoarthritis of both hands.  No tenderness or synovitis was noted.  He has complete fist formation bilaterally.  Joint protection and muscle strengthening were discussed.  Primary  osteoarthritis of both knees: Chronic pain.  He has discomfort with range of motion of both knee joints.  He has warmth of the right knee on exam today.  No effusion was noted.  Primary osteoarthritis of both feet: Ankle joints have good range of motion with no discomfort.  He has no tenderness or inflammation in his feet.  He wears proper fitting shoes.  Impingement syndrome of right shoulder: Chronic pain.  He  is followed by Dr. Dion Saucier.  He was given a prednisone taper about 2 weeks ago which improved his symptoms significantly.  DDD (degenerative disc disease), cervical: He has limited range of motion with discomfort.  He experiences intermittent symptoms of radiculopathy bilaterally.  He is followed by Dr. Dion Saucier.  Other medical conditions are listed as follows:   Family history of multiple sclerosis - Sister.  He will not be a good candidate for anti-TNF therapy.    History of sleep apnea  History of depression  History of tremor  History of coronary artery disease  Smoker  Orders: Orders Placed This Encounter  Procedures  . CBC with Differential/Platelet  . COMPLETE METABOLIC PANEL WITH GFR   No orders of the defined types were placed in this encounter.     Follow-Up Instructions: Return for Rheumatoid arthritis, Osteoarthritis.   Gearldine Bienenstock, PA-C   I examined and evaluated the patient with Sherron Ales PA.  He has been experiencing some discomfort and swelling in his knee joint.  He was started on Orencia.  The plan was to add Orencia to leflunomide.  But he discontinued this leflunomide.  I advised him to resume leflunomide.  He has prescription at home.  The plan of care was discussed as noted above.  Pollyann Savoy, MD Note - This record has been created using Animal nutritionist.  Chart creation errors have been sought, but may not always  have been located. Such creation errors do not reflect on  the standard of medical care.

## 2019-12-17 ENCOUNTER — Ambulatory Visit: Payer: Medicare Other | Admitting: Rheumatology

## 2019-12-17 ENCOUNTER — Other Ambulatory Visit: Payer: Self-pay

## 2019-12-17 ENCOUNTER — Encounter: Payer: Self-pay | Admitting: Rheumatology

## 2019-12-17 VITALS — BP 141/99 | HR 85 | Resp 17 | Ht 74.0 in | Wt 263.9 lb

## 2019-12-17 DIAGNOSIS — Z820 Family history of epilepsy and other diseases of the nervous system: Secondary | ICD-10-CM

## 2019-12-17 DIAGNOSIS — M5030 Other cervical disc degeneration, unspecified cervical region: Secondary | ICD-10-CM

## 2019-12-17 DIAGNOSIS — Z8659 Personal history of other mental and behavioral disorders: Secondary | ICD-10-CM

## 2019-12-17 DIAGNOSIS — M19072 Primary osteoarthritis, left ankle and foot: Secondary | ICD-10-CM

## 2019-12-17 DIAGNOSIS — M19041 Primary osteoarthritis, right hand: Secondary | ICD-10-CM

## 2019-12-17 DIAGNOSIS — Z8679 Personal history of other diseases of the circulatory system: Secondary | ICD-10-CM

## 2019-12-17 DIAGNOSIS — Z79899 Other long term (current) drug therapy: Secondary | ICD-10-CM | POA: Diagnosis not present

## 2019-12-17 DIAGNOSIS — M170 Bilateral primary osteoarthritis of knee: Secondary | ICD-10-CM

## 2019-12-17 DIAGNOSIS — Z8669 Personal history of other diseases of the nervous system and sense organs: Secondary | ICD-10-CM

## 2019-12-17 DIAGNOSIS — F17200 Nicotine dependence, unspecified, uncomplicated: Secondary | ICD-10-CM

## 2019-12-17 DIAGNOSIS — M0579 Rheumatoid arthritis with rheumatoid factor of multiple sites without organ or systems involvement: Secondary | ICD-10-CM | POA: Diagnosis not present

## 2019-12-17 DIAGNOSIS — M19042 Primary osteoarthritis, left hand: Secondary | ICD-10-CM

## 2019-12-17 DIAGNOSIS — M19071 Primary osteoarthritis, right ankle and foot: Secondary | ICD-10-CM

## 2019-12-17 DIAGNOSIS — M7541 Impingement syndrome of right shoulder: Secondary | ICD-10-CM

## 2019-12-17 LAB — CBC WITH DIFFERENTIAL/PLATELET
Absolute Monocytes: 457 cells/uL (ref 200–950)
Basophils Absolute: 50 cells/uL (ref 0–200)
Basophils Relative: 0.9 %
Eosinophils Absolute: 99 cells/uL (ref 15–500)
Eosinophils Relative: 1.8 %
HCT: 43.5 % (ref 38.5–50.0)
Hemoglobin: 14.4 g/dL (ref 13.2–17.1)
Lymphs Abs: 1095 cells/uL (ref 850–3900)
MCH: 31.5 pg (ref 27.0–33.0)
MCHC: 33.1 g/dL (ref 32.0–36.0)
MCV: 95.2 fL (ref 80.0–100.0)
MPV: 10.8 fL (ref 7.5–12.5)
Monocytes Relative: 8.3 %
Neutro Abs: 3801 cells/uL (ref 1500–7800)
Neutrophils Relative %: 69.1 %
Platelets: 243 10*3/uL (ref 140–400)
RBC: 4.57 10*6/uL (ref 4.20–5.80)
RDW: 13.5 % (ref 11.0–15.0)
Total Lymphocyte: 19.9 %
WBC: 5.5 10*3/uL (ref 3.8–10.8)

## 2019-12-17 LAB — COMPLETE METABOLIC PANEL WITH GFR
AG Ratio: 1.6 (calc) (ref 1.0–2.5)
ALT: 19 U/L (ref 9–46)
AST: 19 U/L (ref 10–35)
Albumin: 4.3 g/dL (ref 3.6–5.1)
Alkaline phosphatase (APISO): 72 U/L (ref 35–144)
BUN: 18 mg/dL (ref 7–25)
CO2: 30 mmol/L (ref 20–32)
Calcium: 9.4 mg/dL (ref 8.6–10.3)
Chloride: 102 mmol/L (ref 98–110)
Creat: 1.17 mg/dL (ref 0.70–1.33)
GFR, Est African American: 79 mL/min/{1.73_m2} (ref >=60)
GFR, Est Non African American: 68 mL/min/{1.73_m2} (ref >=60)
Globulin: 2.7 g/dL (calc) (ref 1.9–3.7)
Glucose, Bld: 98 mg/dL (ref 65–99)
Potassium: 3.9 mmol/L (ref 3.5–5.3)
Sodium: 140 mmol/L (ref 135–146)
Total Bilirubin: 0.7 mg/dL (ref 0.2–1.2)
Total Protein: 7 g/dL (ref 6.1–8.1)

## 2019-12-17 NOTE — Patient Instructions (Signed)
Standing Labs We placed an order today for your standing lab work.    Please come back and get your standing labs in June and every 3 months.   We have open lab daily Monday through Thursday from 8:30-12:30 PM and 1:30-4:30 PM and Friday from 8:30-12:30 PM and 1:30-4:00 PM at the office of Dr. Shaili Deveshwar.   You may experience shorter wait times on Monday and Friday afternoons. The office is located at [address], [address], [place], [place] [address] No appointment is necessary.   Labs are drawn by [facility].  You may receive a bill from [facility] for your lab work.  If you wish to have your labs drawn at another location, please call the office 24 hours in advance to send orders.  If you have any questions regarding directions or hours of operation,  please call [phone].   Just as a reminder please drink plenty of water prior to coming for your lab work. Thanks!  

## 2019-12-18 NOTE — Progress Notes (Signed)
CBC and CMP WNL

## 2019-12-21 MED FILL — ORENCIA CLICKJECT 125 MG/ML: 125 | 28 days supply | Qty: 4 | Fill #1

## 2020-01-18 MED FILL — ORENCIA CLICKJECT 125 MG/ML: 125 | 28 days supply | Qty: 4 | Fill #2

## 2020-01-25 ENCOUNTER — Other Ambulatory Visit: Payer: Self-pay | Admitting: Rheumatology

## 2020-02-11 ENCOUNTER — Other Ambulatory Visit: Payer: Self-pay | Admitting: Rheumatology

## 2020-02-11 DIAGNOSIS — M0579 Rheumatoid arthritis with rheumatoid factor of multiple sites without organ or systems involvement: Secondary | ICD-10-CM

## 2020-02-11 NOTE — Telephone Encounter (Signed)
Last Visit: 12/17/2019 Next Visit: 03/17/2020 Labs: 12/17/2019 CBC and CMP WNL TB Gold: 10/20/2019 negative   Okay to refill per Dr. Corliss Skains.

## 2020-02-15 MED FILL — ORENCIA CLICKJECT 125 MG/ML: 125 | 28 days supply | Qty: 4 | Fill #0

## 2020-02-24 ENCOUNTER — Other Ambulatory Visit: Payer: Self-pay | Admitting: Rheumatology

## 2020-02-24 NOTE — Telephone Encounter (Signed)
Last Visit: 12/17/2019 Next Visit: 03/17/2020  Okay to refill per Dr. Corliss Skains

## 2020-03-03 NOTE — Progress Notes (Signed)
Office Visit Note  Patient: Dylan Dudley             Date of Birth: 11-29-1960           MRN: [id number]             PCP: Tobe Sos, MD Referring: Tobe Sos, MD Visit Date: 03/17/2020 Occupation: '@GUAROCC'$ @  Subjective:  Rheumatoid Arthritis (No improvement with Orencia and Prednisone, Bil shoulder pain, bil knee pain)   History of Present Illness: Dylan Dudley is a 59 y.o. male with history of seropositive rheumatoid arthritis and osteoarthritis.  He states around the first week of May he came from [facility] and had lower back pain at the time he was given a prednisone Dosepak.  He has been taking Orencia.  25th 2021.  He has not noticed much improvement tolerance ER.  He states he was also given another prednisone pack by his PCP due to upper back and chest pain.  He finished his last week.  He continues to have discomfort in his shoulders and his knee joints.  None of the other joints are painful.  There is no joint swelling.  Activities of Daily Living:  Patient reports morning stiffness for 10 minutes.   Patient Reports nocturnal pain.  Difficulty dressing/grooming: Denies Difficulty climbing stairs: Reports Difficulty getting out of chair: Reports Difficulty using hands for taps, buttons, cutlery, and/or writing: Denies  Review of Systems  Constitutional: Positive for fatigue.  HENT: Negative for mouth dryness.   Eyes: Negative for dryness.  Respiratory: Negative for shortness of breath.   Cardiovascular: Negative for swelling in legs/feet.  Gastrointestinal: Negative for constipation.  Endocrine: Positive for increased urination.  Genitourinary: Negative for difficulty urinating.  Musculoskeletal: Positive for arthralgias, joint pain and morning stiffness.  Skin: Negative for rash.  Allergic/Immunologic: Negative for susceptible to infections.  Neurological: Negative for numbness.  Hematological: Positive for bruising/bleeding tendency.   Psychiatric/Behavioral: Negative for sleep disturbance.    PMFS History:  Patient Active Problem List   Diagnosis Date Noted   Rheumatoid arthritis with rheumatoid factor of multiple sites without organ or systems involvement ([place]) 09/10/2016   Primary osteoarthritis of both hands 09/10/2016   Primary osteoarthritis of both knees 09/10/2016   Primary osteoarthritis of both feet 09/10/2016   DJD (degenerative joint disease), cervical 09/10/2016   History of tremor 09/10/2016   History of sleep apnea 09/10/2016   Coronary artery disease involving native heart 09/10/2016   Smoker 09/10/2016   High risk medication use 09/10/2016   Tear of right rotator cuff 03/14/2012   Impingement syndrome of right shoulder 03/14/2012   AC (acromioclavicular) arthritis, right 03/14/2012   Disorder of tendon of biceps 03/14/2012    Past Medical History:  Diagnosis Date   AC (acromioclavicular) arthritis 03/14/2012   Anxiety    Arthritis    "shoulders; knees" (07/17/2013)   DDD (degenerative disc disease)    Depression    Disorder of tendon of biceps 03/14/2012   GERD (gastroesophageal reflux disease)    Hyperlipemia    Hypertension    Impingement syndrome of right shoulder 03/14/2012   Pneumonia 2012   "once" (07/17/2013)   Rheumatoid arthritis (HCC)    Seasonal allergies    Sleep apnea    "did test 2 months ago; get results in December" (07/17/2013)   Tear of right rotator cuff 03/14/2012    Family History  Problem Relation Age of Onset   Breast cancer Mother    Hypertension  Mother    Hypertension Father    Multiple sclerosis Sister    Past Surgical History:  Procedure Laterality Date   ANTERIOR CERVICAL DECOMP/DISCECTOMY FUSION  08/2011   [place]   BUNIONECTOMY Bilateral 1998   DEBRIDEMENT AND CLOSURE WOUND  12/2012   "back of my neck" (07/17/2013)   DEBRIDEMENT AND CLOSURE WOUND N/A 07/17/2013   Procedure: DEBRIDEMENT  OF SKIN, SUBCUTANEOUS  TISSUE BONE, MUSCLE FLAP, AND CLOSURE WOUND;  Surgeon: Irene Limbo, MD;  Location: [place];  Service: Plastics;  Laterality: N/A;   EXCISIONAL HEMORRHOIDECTOMY  ~ [place]  10/2012 X 2   "back of my neck" (07/17/2013)   MUSCLE FLAP CLOSURE  07/17/2013   "to back of my neck" (07/17/2013)   NECK SURGERY  09/2012   "spur removed from back of my neck" (07/17/2013)   SHOULDER ARTHROSCOPY Left 2008   SHOULDER ARTHROSCOPY W/ ROTATOR CUFF REPAIR Right 2013   "and bicep repair" (07/17/2013)   TONSILLECTOMY  ~ 1974   Social History   Social History Narrative   Not on file   Immunization History  Administered Date(s) Administered   Influenza,inj,Quad PF,6+ Mos 07/18/2013   Pneumococcal Polysaccharide-23 07/18/2013   Tdap 11/28/2006, 04/30/2014     Objective: Vital Signs: BP (!) 138/94 (BP Location: Right Arm, Patient Position: Sitting, Cuff Size: Large)    Pulse 95    Resp 14    Ht '6\' 2"'$  (1.88 m)    Wt 254 lb (115.2 kg)    BMI 32.61 kg/m    Physical Exam Vitals and nursing note reviewed.  Constitutional:      Appearance: He is well-developed.  HENT:     Head: Normocephalic and atraumatic.  Eyes:     Conjunctiva/sclera: Conjunctivae normal.     Pupils: Pupils are equal, round, and reactive to light.  Cardiovascular:     Rate and Rhythm: Normal rate and regular rhythm.     Heart sounds: Normal heart sounds.  Pulmonary:     Effort: Pulmonary effort is normal.     Breath sounds: Normal breath sounds.  Abdominal:     General: Bowel sounds are normal.     Palpations: Abdomen is soft.  Musculoskeletal:     Cervical back: Normal range of motion and neck supple.  Skin:    General: Skin is warm and dry.     Capillary Refill: Capillary refill takes less than 2 seconds.  Neurological:     Mental Status: He is alert and oriented to person, place, and time.  Psychiatric:        Behavior: Behavior normal.      Musculoskeletal Exam: He has  discomfort range of motion of the cervical spine.  He had bilateral trapezius spasm.  He had discomfort range of motion of his bilateral shoulders.  Elbow joints, wrist joints, MCPs, PIPs and DIPs with good range of motion with no synovitis.  Hip joints, knee joints with good range of motion.  No warmth swelling effusion was noted.  He had no tenderness across MTPs.  CDAI Exam: CDAI Score: 3.6  Patient Global: 3 mm; Provider Global: 3 mm Swollen: 0 ; Tender: 3  Joint Exam 03/17/2020      Right  Left  Glenohumeral      Tender  Knee   Tender   Tender     Investigation: No additional findings.  Imaging: No results found.  Recent Labs: Lab Results  Component Value Date   WBC 5.5  12/17/2019   HGB 14.4 12/17/2019   PLT 243 12/17/2019   NA 140 12/17/2019   K 3.9 12/17/2019   CL 102 12/17/2019   CO2 30 12/17/2019   GLUCOSE 98 12/17/2019   BUN 18 12/17/2019   CREATININE 1.17 12/17/2019   BILITOT 0.7 12/17/2019   ALKPHOS 68 01/10/2017   AST 19 12/17/2019   ALT 19 12/17/2019   PROT 7.0 12/17/2019   ALBUMIN 4.6 01/10/2017   CALCIUM 9.4 12/17/2019   GFRAA 79 12/17/2019   QFTBGOLDPLUS NEGATIVE 10/20/2019   Labs from his PCPs office March 07, 2020 CBC WBC 5.0, hemoglobin 14.9, platelets 207, ESR 35, CMP normal AST 28 ALT 22 creatinine 1.3, LDL 70, HDL 49  Speciality Comments: PLQ Eye Exam: 11/19/2018 WNL @ [facility] Follow up in 1 year  Procedures:  No procedures performed Allergies: Codeine, Daptomycin, Hydroxyzine, Latex, and Vancomycin   Assessment / Plan:     Visit Diagnoses: Rheumatoid arthritis with rheumatoid factor of multiple sites without organ or systems involvement ([place]) - +CCP.  Patient has positive rheumatoid factor and positive CCP has had elevated sedimentation rate in the past.  He had some synovitis on the ultrasound examination in the past but had never significant synovitis on examination.  He had no benefit in intolerance to methotrexate which was  discontinued.  He has had prednisone taper for several reasons in the past which did not help his symptoms.  He has been on Orencia for almost 4 months now and has not noticed any benefit from [place].  We discussed coming off Orencia to see if he develops any synovitis.  He wants to finish the prescription he has and then he will discontinue Orencia.  I will obtain x-rays today.- Plan: XR Hand 2 View Right, XR Hand 2 View Left, XR Foot 2 Views Right, XR Foot 2 Views Left.  X-rays are consistent with osteoarthritis.  No changes from rheumatoid arthritis were noted.  It is possible that his arthritis could be very well controlled with medications and for that reason he did not progress.  I believe his stopping Orencia and observing to see if his disease progresses will be the best way to find out.  He was in agreement.  High risk medication use - Orencia 125 mg sq injections once weekly.D/c PLQ due to inadequate response and d/c low-dose methotrexate (4 tablets weekly) due to elevated creatinine.  He brought labs from his PCP from June 2021 which were within normal limits.  Primary osteoarthritis of both hands - Plan: XR Hand 2 View Right, XR Hand 2 View Left.  X-ray of bilateral hands were consistent with osteoarthritis.  Primary osteoarthritis of both knees -has been experiencing increased pain and discomfort in his knee joints.  No synovitis was noted.  I will obtain x-ray of bilateral knee joints 3 views today.  X-rays revealed bilateral severe osteoarthritis and severe chondromalacia patella.  He would benefit from bilateral total knee replacement.  Patient states he will contact Dr. Mardelle Matte.  Primary osteoarthritis of both feet-I will also obtain x-ray of bilateral feet to monitor for disease progression.  Plan: XR Foot 2 Views Right, XR Foot 2 Views Left.  X-ray of bilateral feet were consistent with osteoarthritis.  Postsurgical changes were noted in bilateral feet.  Impingement syndrome of right  shoulder -complains of discomfort in his bilateral shoulders and trapezius area.  He is followed by Dr. Mardelle Matte.    DDD (degenerative disc disease), cervical -I believe the pain in  the neck and the shoulders is most likely due to trapezius muscle spasm.  He is followed by Dr. Mardelle Matte.    Family history of multiple sclerosis - Sister.  He will not be a good candidate for anti-TNF therapy.    History of tremor  History of sleep apnea  History of depression  History of coronary artery disease  Smoker  Orders: Orders Placed This Encounter  Procedures   XR Hand 2 View Right   XR Hand 2 View Left   XR Foot 2 Views Right   XR Foot 2 Views Left   XR KNEE 3 VIEW RIGHT   XR KNEE 3 VIEW LEFT   No orders of the defined types were placed in this encounter.     Follow-Up Instructions: Return in about 3 months (around 06/17/2020) for Rheumatoid arthritis, Osteoarthritis.   Bo Merino, MD  Note - This record has been created using Editor, commissioning.  Chart creation errors have been sought, but may not always  have been located. Such creation errors do not reflect on  the standard of medical care.

## 2020-03-14 MED FILL — ORENCIA CLICKJECT 125 MG/ML: 125 | 28 days supply | Qty: 4 | Fill #1

## 2020-03-17 ENCOUNTER — Ambulatory Visit: Payer: Self-pay

## 2020-03-17 ENCOUNTER — Ambulatory Visit: Payer: Medicare Other | Admitting: Rheumatology

## 2020-03-17 ENCOUNTER — Encounter: Payer: Self-pay | Admitting: Rheumatology

## 2020-03-17 ENCOUNTER — Other Ambulatory Visit: Payer: Self-pay

## 2020-03-17 VITALS — BP 138/94 | HR 95 | Resp 14 | Ht 74.0 in | Wt 254.0 lb

## 2020-03-17 DIAGNOSIS — M1711 Unilateral primary osteoarthritis, right knee: Secondary | ICD-10-CM | POA: Diagnosis not present

## 2020-03-17 DIAGNOSIS — M19072 Primary osteoarthritis, left ankle and foot: Secondary | ICD-10-CM | POA: Diagnosis not present

## 2020-03-17 DIAGNOSIS — M5030 Other cervical disc degeneration, unspecified cervical region: Secondary | ICD-10-CM

## 2020-03-17 DIAGNOSIS — Z8669 Personal history of other diseases of the nervous system and sense organs: Secondary | ICD-10-CM

## 2020-03-17 DIAGNOSIS — M19042 Primary osteoarthritis, left hand: Secondary | ICD-10-CM | POA: Diagnosis not present

## 2020-03-17 DIAGNOSIS — M19071 Primary osteoarthritis, right ankle and foot: Secondary | ICD-10-CM

## 2020-03-17 DIAGNOSIS — Z820 Family history of epilepsy and other diseases of the nervous system: Secondary | ICD-10-CM

## 2020-03-17 DIAGNOSIS — M1712 Unilateral primary osteoarthritis, left knee: Secondary | ICD-10-CM

## 2020-03-17 DIAGNOSIS — Z8659 Personal history of other mental and behavioral disorders: Secondary | ICD-10-CM

## 2020-03-17 DIAGNOSIS — Z8679 Personal history of other diseases of the circulatory system: Secondary | ICD-10-CM

## 2020-03-17 DIAGNOSIS — M170 Bilateral primary osteoarthritis of knee: Secondary | ICD-10-CM

## 2020-03-17 DIAGNOSIS — Z79899 Other long term (current) drug therapy: Secondary | ICD-10-CM | POA: Diagnosis not present

## 2020-03-17 DIAGNOSIS — M0579 Rheumatoid arthritis with rheumatoid factor of multiple sites without organ or systems involvement: Secondary | ICD-10-CM | POA: Diagnosis not present

## 2020-03-17 DIAGNOSIS — F17200 Nicotine dependence, unspecified, uncomplicated: Secondary | ICD-10-CM

## 2020-03-17 DIAGNOSIS — M7541 Impingement syndrome of right shoulder: Secondary | ICD-10-CM

## 2020-03-17 DIAGNOSIS — M19041 Primary osteoarthritis, right hand: Secondary | ICD-10-CM

## 2020-03-17 NOTE — Patient Instructions (Signed)
Standing Labs We placed an order today for your standing lab work.   Please have your standing labs drawn in September and every 3 months   If possible, please have your labs drawn 2 weeks prior to your appointment so that the provider can discuss your results at your appointment.  We have open lab daily Monday through Thursday from 8:30-12:30 PM and 1:30-4:30 PM and Friday from 8:30-12:30 PM and 1:30-4:00 PM at the office of Dr. Santino Kinsella, [facility].   You may experience shorter wait times on Monday and Friday afternoons. The office is located at [address], [address], [place], [place] [address] No appointment is necessary.   Labs are drawn by [facility].  You may receive a bill from [facility] for your lab work.  If you wish to have your labs drawn at another location, please call the office 24 hours in advance to send orders.  If you have any questions regarding directions or hours of operation,  please call [phone].   As a reminder, please drink plenty of water prior to coming for your lab work. Thanks!   

## 2020-03-30 ENCOUNTER — Telehealth: Payer: Self-pay | Admitting: Rheumatology

## 2020-03-30 NOTE — Telephone Encounter (Signed)
I reviewed Dr. Fatima Sanger office visit note from 03/17/20 with the patient.  There is no documentation stating he was to resume [place].  She discussed discontinuing Orencia to see if he develops increased joint pain or inflammation.   If he would like to discuss resuming Arava we can further discuss with Dr. Corliss Skains when she is back in the office.

## 2020-03-30 NOTE — Telephone Encounter (Signed)
Patient left a voicemail requesting prescription refill of Leflunomide.  Patient states he only has one pill remaining.

## 2020-03-30 NOTE — Telephone Encounter (Signed)
Contacted patient and clarified patient is requesting refill on Leflunomide. Patient states he was under the impression from his last office visit that he was to restart the Leflunomide.   Last Visit: 03/17/2020 Next Visit: 06/20/2020  Labs: 12/17/2019 WNL  DX: Rheumatoid arthritis   Please advise.

## 2020-03-30 NOTE — Telephone Encounter (Signed)
Patient advised Sherron Ales, PA-C reviewed Dr. Fatima Sanger office visit note from 03/17/20 with the patient. There is no documentation stating he was to resume [place].  She discussed discontinuing Orencia to see if he develops increased joint pain or inflammation.  Patient states he has been advised to restart the Arava and would like for [place] to talk to Dr. Corliss Skains when she comes back to the office on 04/04/2020.

## 2020-04-04 NOTE — Telephone Encounter (Signed)
Advised patient The plan is to hold off Orencia and not to start any other DMARDs just to watch if symptoms get any worse.  As most of his symptoms are coming from underlying osteoarthritis and not from rheumatoid arthritis at this point. Patient verbalized understanding and will contact the office with any concerns or worsening symptoms.

## 2020-04-04 NOTE — Telephone Encounter (Signed)
The plan is to hold off Orencia and not to start any other DMARDs just to watch if symptoms get any worse.  As most of his symptoms are coming from underlying osteoarthritis and not from rheumatoid arthritis at this point.

## 2020-04-05 ENCOUNTER — Telehealth: Payer: Self-pay | Admitting: Rheumatology

## 2020-04-05 NOTE — Telephone Encounter (Signed)
Advised patient that he can discontinue folic acid. Patient verbalized understanding.

## 2020-04-05 NOTE — Telephone Encounter (Signed)
Patient called stating since he is no longer taking Orencia does Dr. Corliss Skains still want him to continue taking Folic Acid.  Patient requested a return call.

## 2020-06-06 NOTE — Progress Notes (Deleted)
Office Visit Note  Patient: Dylan Dudley             Date of Birth: October 01, 1960           MRN: [id number]             PCP: Elise Benne, MD Referring: Elise Benne, MD Visit Date: 06/20/2020 Occupation: @GUAROCC @  Subjective:  No chief complaint on file.   History of Present Illness: Dylan Dudley is a 59 y.o. male ***   Activities of Daily Living:  Patient reports morning stiffness for *** {minute/hour:19697}.   Patient {ACTIONS;DENIES/REPORTS:21021675::"Denies"} nocturnal pain.  Difficulty dressing/grooming: {ACTIONS;DENIES/REPORTS:21021675::"Denies"} Difficulty climbing stairs: {ACTIONS;DENIES/REPORTS:21021675::"Denies"} Difficulty getting out of chair: {ACTIONS;DENIES/REPORTS:21021675::"Denies"} Difficulty using hands for taps, buttons, cutlery, and/or writing: {ACTIONS;DENIES/REPORTS:21021675::"Denies"}  No Rheumatology ROS completed.   PMFS History:  Patient Active Problem List   Diagnosis Date Noted  . Rheumatoid arthritis with rheumatoid factor of multiple sites without organ or systems involvement (HCC) 09/10/2016  . Primary osteoarthritis of both hands 09/10/2016  . Primary osteoarthritis of both knees 09/10/2016  . Primary osteoarthritis of both feet 09/10/2016  . DJD (degenerative joint disease), cervical 09/10/2016  . History of tremor 09/10/2016  . History of sleep apnea 09/10/2016  . Coronary artery disease involving native heart 09/10/2016  . Smoker 09/10/2016  . High risk medication use 09/10/2016  . Tear of right rotator cuff 03/14/2012  . Impingement syndrome of right shoulder 03/14/2012  . AC (acromioclavicular) arthritis, right 03/14/2012  . Disorder of tendon of biceps 03/14/2012    Past Medical History:  Diagnosis Date  . AC (acromioclavicular) arthritis 03/14/2012  . Anxiety   . Arthritis    "shoulders; knees" (07/17/2013)  . DDD (degenerative disc disease)   . Depression   . Disorder of tendon of biceps 03/14/2012  .  GERD (gastroesophageal reflux disease)   . Hyperlipemia   . Hypertension   . Impingement syndrome of right shoulder 03/14/2012  . Pneumonia 2012   "once" (07/17/2013)  . Rheumatoid arthritis (HCC)   . Seasonal allergies   . Sleep apnea    "did test 2 months ago; get results in December" (07/17/2013)  . Tear of right rotator cuff 03/14/2012    Family History  Problem Relation Age of Onset  . Breast cancer Mother   . Hypertension Mother   . Hypertension Father   . Multiple sclerosis Sister    Past Surgical History:  Procedure Laterality Date  . ANTERIOR CERVICAL DECOMP/DISCECTOMY FUSION  08/2011   [place]  . BUNIONECTOMY Bilateral 1998  . DEBRIDEMENT AND CLOSURE WOUND  12/2012   "back of my neck" (07/17/2013)  . DEBRIDEMENT AND CLOSURE WOUND N/A 07/17/2013   Procedure: DEBRIDEMENT  OF SKIN, SUBCUTANEOUS TISSUE BONE, MUSCLE FLAP, AND CLOSURE WOUND;  Surgeon: 07/19/2013, MD;  Location: [facility];  Service: Plastics;  Laterality: N/A;  . EXCISIONAL HEMORRHOIDECTOMY  ~ 1996  . INCISION AND DRAINAGE OF WOUND  10/2012 X 2   "back of my neck" (07/17/2013)  . MUSCLE FLAP CLOSURE  07/17/2013   "to back of my neck" (07/17/2013)  . NECK SURGERY  09/2012   "spur removed from back of my neck" (07/17/2013)  . SHOULDER ARTHROSCOPY Left 2008  . SHOULDER ARTHROSCOPY W/ ROTATOR CUFF REPAIR Right 2013   "and bicep repair" (07/17/2013)  . TONSILLECTOMY  ~ 51   Social History   Social History Narrative  . Not on file   Immunization History  Administered Date(s) Administered  . Influenza,inj,Quad PF,6+ Mos  07/18/2013  . Pneumococcal Polysaccharide-23 07/18/2013  . Tdap 11/28/2006, 04/30/2014     Objective: Vital Signs: There were no vitals taken for this visit.   Physical Exam   Musculoskeletal Exam: ***  CDAI Exam: CDAI Score: -- Patient Global: --; Provider Global: -- Swollen: --; Tender: -- Joint Exam 06/20/2020   No joint exam has been documented for this visit    There is currently no information documented on the homunculus. Go to the Rheumatology activity and complete the homunculus joint exam.  Investigation: No additional findings.  Imaging: No results found.  Recent Labs: Lab Results  Component Value Date   WBC 5.5 12/17/2019   HGB 14.4 12/17/2019   PLT 243 12/17/2019   NA 140 12/17/2019   K 3.9 12/17/2019   CL 102 12/17/2019   CO2 30 12/17/2019   GLUCOSE 98 12/17/2019   BUN 18 12/17/2019   CREATININE 1.17 12/17/2019   BILITOT 0.7 12/17/2019   ALKPHOS 68 01/10/2017   AST 19 12/17/2019   ALT 19 12/17/2019   PROT 7.0 12/17/2019   ALBUMIN 4.6 01/10/2017   CALCIUM 9.4 12/17/2019   GFRAA 79 12/17/2019   QFTBGOLDPLUS NEGATIVE 10/20/2019    Speciality Comments: PLQ Eye Exam: 11/19/2018 WNL @ [facility] Follow up in 1 year  Procedures:  No procedures performed Allergies: Codeine, Daptomycin, Hydroxyzine, Latex, and Vancomycin   Assessment / Plan:     Visit Diagnoses: No diagnosis found.  Orders: No orders of the defined types were placed in this encounter.  No orders of the defined types were placed in this encounter.   Face-to-face time spent with patient was *** minutes. Greater than 50% of time was spent in counseling and coordination of care.  Follow-Up Instructions: No follow-ups on file.   Ellen Henri, CMA  Note - This record has been created using Animal nutritionist.  Chart creation errors have been sought, but may not always  have been located. Such creation errors do not reflect on  the standard of medical care.

## 2020-06-20 ENCOUNTER — Ambulatory Visit: Payer: Medicare Other | Admitting: Physician Assistant

## 2020-08-03 NOTE — Progress Notes (Signed)
Office Visit Note  Patient: Dylan Dudley             Date of Birth: 24-Dec-1960           MRN: [id number]             PCP: Tobe Sos, MD Referring: Tobe Sos, MD Visit Date: 08/05/2020 Occupation: @GUAROCC @  Subjective:  Pain in both knee joints   History of Present Illness: Dylan Dudley is a 59 y.o. male with history of seropositive rheumatoid arthritis, osteoarthritis, and DDD.  Patient has had several gaps in therapy while on Orencia.  His last dose of Orencia was in September 2021.  He reports that he was diagnosed with CHF in October and currently has an external defibrillator.  He states that he has been feeling much better and has changed his diet which seems to be alleviating his symptoms.  He would like to discuss restarting on Orencia.  He has been experiencing increased pain in both knee joints as well as recurrent effusions in the left knee.  He was evaluated by his orthopedist who aspirated and injected the left knee joint with cortisone about 2 weeks ago.  He has been wearing a brace daily.  He denies any other joint pain or joint swelling.    Activities of Daily Living:  Patient reports morning stiffness for 15 minutes.   Patient Reports nocturnal pain.  Difficulty dressing/grooming: Denies Difficulty climbing stairs: Reports Difficulty getting out of chair: Reports Difficulty using hands for taps, buttons, cutlery, and/or writing: Denies  Review of Systems  Constitutional: Negative for fatigue and night sweats.  HENT: Negative for mouth sores, mouth dryness and nose dryness.   Eyes: Negative for pain, redness, itching and dryness.  Respiratory: Positive for cough. Negative for shortness of breath and difficulty breathing.   Cardiovascular: Negative for chest pain, palpitations, hypertension, irregular heartbeat and swelling in legs/feet.  Gastrointestinal: Negative for blood in stool, constipation and diarrhea.  Endocrine: Positive for  increased urination.  Genitourinary: Negative for difficulty urinating and painful urination.  Musculoskeletal: Positive for arthralgias, joint pain, joint swelling and morning stiffness. Negative for myalgias, muscle weakness, muscle tenderness and myalgias.  Skin: Negative for color change, rash, hair loss, nodules/bumps, redness, skin tightness, ulcers and sensitivity to sunlight.  Allergic/Immunologic: Negative for susceptible to infections.  Neurological: Negative for dizziness, fainting, numbness, headaches, memory loss, night sweats and weakness.  Hematological: Negative for bruising/bleeding tendency and swollen glands.  Psychiatric/Behavioral: Positive for sleep disturbance. Negative for depressed mood and confusion. The patient is not nervous/anxious.     PMFS History:  Patient Active Problem List   Diagnosis Date Noted  . Rheumatoid arthritis with rheumatoid factor of multiple sites without organ or systems involvement ([place]) 09/10/2016  . Primary osteoarthritis of both hands 09/10/2016  . Primary osteoarthritis of both knees 09/10/2016  . Primary osteoarthritis of both feet 09/10/2016  . DJD (degenerative joint disease), cervical 09/10/2016  . History of tremor 09/10/2016  . History of sleep apnea 09/10/2016  . Coronary artery disease involving native heart 09/10/2016  . Smoker 09/10/2016  . High risk medication use 09/10/2016  . Tear of right rotator cuff 03/14/2012  . Impingement syndrome of right shoulder 03/14/2012  . AC (acromioclavicular) arthritis, right 03/14/2012  . Disorder of tendon of biceps 03/14/2012    Past Medical History:  Diagnosis Date  . AC (acromioclavicular) arthritis 03/14/2012  . Anxiety   . Arthritis    "shoulders; knees" (07/17/2013)  .  Congestive heart failure (CHF) (HCC)    per patient   . DDD (degenerative disc disease)   . Depression   . Disorder of tendon of biceps 03/14/2012  . GERD (gastroesophageal reflux disease)   . Hyperlipemia    . Hypertension   . Impingement syndrome of right shoulder 03/14/2012  . Pneumonia 2012   "once" (07/17/2013)  . Rheumatoid arthritis (HCC)   . Seasonal allergies   . Sleep apnea    "did test 2 months ago; get results in December" (07/17/2013)  . Tear of right rotator cuff 03/14/2012    Family History  Problem Relation Age of Onset  . Breast cancer Mother   . Hypertension Mother   . Hypertension Father   . Multiple sclerosis Sister    Past Surgical History:  Procedure Laterality Date  . ANTERIOR CERVICAL DECOMP/DISCECTOMY FUSION  08/2011   [place]  . BUNIONECTOMY Bilateral 1998  . DEBRIDEMENT AND CLOSURE WOUND  12/2012   "back of my neck" (07/17/2013)  . DEBRIDEMENT AND CLOSURE WOUND N/A 07/17/2013   Procedure: DEBRIDEMENT  OF SKIN, SUBCUTANEOUS TISSUE BONE, MUSCLE FLAP, AND CLOSURE WOUND;  Surgeon: Glenna Fellows, MD;  Location: [facility] OR;  Service: Plastics;  Laterality: N/A;  . EXCISIONAL HEMORRHOIDECTOMY  ~ 1996  . INCISION AND DRAINAGE OF WOUND  10/2012 X 2   "back of my neck" (07/17/2013)  . MUSCLE FLAP CLOSURE  07/17/2013   "to back of my neck" (07/17/2013)  . NECK SURGERY  09/2012   "spur removed from back of my neck" (07/17/2013)  . SHOULDER ARTHROSCOPY Left 2008  . SHOULDER ARTHROSCOPY W/ ROTATOR CUFF REPAIR Right 2013   "and bicep repair" (07/17/2013)  . TONSILLECTOMY  ~ 16   Social History   Social History Narrative  . Not on file   Immunization History  Administered Date(s) Administered  . Influenza,inj,Quad PF,6+ Mos 07/18/2013  . Moderna SARS-COVID-2 Vaccination 10/31/2019, 11/28/2019, 07/22/2020  . Pneumococcal Polysaccharide-23 07/18/2013  . Tdap 11/28/2006, 04/30/2014     Objective: Vital Signs: BP 99/71 (BP Location: Left Arm, Patient Position: Sitting, Cuff Size: Normal)   Pulse (!) 102   Resp 16   Ht 6\' 2"  (1.88 m)   Wt 244 lb 6.4 oz (110.9 kg)   BMI 31.38 kg/m    Physical Exam Vitals and nursing note reviewed.  Constitutional:       Appearance: He is well-developed.  HENT:     Head: Normocephalic and atraumatic.  Eyes:     Conjunctiva/sclera: Conjunctivae normal.     Pupils: Pupils are equal, round, and reactive to light.  Abdominal:     Palpations: Abdomen is soft.  Musculoskeletal:     Cervical back: Normal range of motion and neck supple.  Skin:    General: Skin is warm and dry.     Capillary Refill: Capillary refill takes less than 2 seconds.  Neurological:     Mental Status: He is alert and oriented to person, place, and time.  Psychiatric:        Behavior: Behavior normal.      Musculoskeletal Exam: C-spine, thoracic spine, and lumbar spine good ROM.  Shoulder joints, elbow joints, wrist joints, MCPs, PIPs, and DIPs good ROM with no synovitis.  Complete fist formation bilaterally.  PIP and DIP thickening consistent with osteoarthritis of both hands.  Hip joints good ROM with no discomfort.  Warmth and effusion of left knee joint. Warmth of the right knee joint.  Ankle joints good ROM with no tenderness or inflammation.  CDAI Exam: CDAI Score: 3  Patient Global: 5 mm; Provider Global: 5 mm Swollen: 1 ; Tender: 1  Joint Exam 08/05/2020      Right  Left  Knee     Swollen Tender     Investigation: No additional findings.  Imaging: No results found.  Recent Labs: Lab Results  Component Value Date   WBC 5.5 12/17/2019   HGB 14.4 12/17/2019   PLT 243 12/17/2019   NA 140 12/17/2019   K 3.9 12/17/2019   CL 102 12/17/2019   CO2 30 12/17/2019   GLUCOSE 98 12/17/2019   BUN 18 12/17/2019   CREATININE 1.17 12/17/2019   BILITOT 0.7 12/17/2019   ALKPHOS 68 01/10/2017   AST 19 12/17/2019   ALT 19 12/17/2019   PROT 7.0 12/17/2019   ALBUMIN 4.6 01/10/2017   CALCIUM 9.4 12/17/2019   GFRAA 79 12/17/2019   QFTBGOLDPLUS NEGATIVE 10/20/2019    Speciality Comments: PLQ Eye Exam: 11/19/2018 WNL @ [facility] Follow up in 1 year  Procedures:  No procedures performed Allergies: Codeine,  Daptomycin, Hydroxyzine, Latex, and Vancomycin   Assessment / Plan:     Visit Diagnoses: Rheumatoid arthritis with rheumatoid factor of multiple sites without organ or systems involvement ([place]) - +RF, +CCP, elevated ESR: He presents today with pain in both knee joints.  He has warmth and an effusion of the left knee joint on exam.  Warmth of the right knee noted.  His left knee joint effusion was aspirated and injected with cortisone 2 weeks ago by his orthopedist. He is not experiencing any other joint pain or inflammation at this time. ESR was 52 and CRP was 11.0 on 07/29/20.  He has been off of Orencia since September 2021 due to running out of his prescription and being unsure if orencia was effective.  He was diagnosed with CHF in October 2021 and currently has an external defibrillator. He will not be a candidate for anti-TNF inhibitors due to PMHx of CHF and family history of MS.   We reviewed the indications, contraindications, and potential side effects of orencia today. He plans on restarting orencia once he receives the refill.  A new prescription for orencia was sent to the pharmacy today.  He will require updated lab work in 1 month then every 3 months. He will follow up in the office in 3 months to assess his response to restarting Orencia.     High risk medication use - Orencia 125 mg sq injections once weekly.  He has had several gaps in therapy.  He discontinued Orencia in September but will be restarting orencia this week.  Refill was sent to the pharmacy today.  D/c PLQ due to inadequate response and d/c low-dose methotrexate (4 tablets weekly) due to elevated creatinine.   - Plan: QuantiFERON-TB Gold Plus CBC and BMP were updated on 07/29/20: WBC 46., RBC 4.33, Hgb 13.3, Plt 334, creatinine 1.3, GFR 68.  He will be due to update lab work in 1 month then every 3 months to monitor for drug toxicity. Standing orders for CBC and CMP were placed today.   TB gold negative on 10/20/19 and will  continue to be monitored yearly.  Future order for TB gold placed today.  He has not had any recent infections.  We discussed the importance of holding Orencia if he develops signs or symptoms of an infection and to resume once the infection has completely cleared.  He has received all 3 covid-19 vaccine doses.  Screening for tuberculosis - Future order for TB gold placed today. Plan: QuantiFERON-TB Gold Plus  Primary osteoarthritis of both hands: PIP and DIP thickening consistent with osteoarthritis of both hands.  No tenderness or inflammation on exam.  Joint protection and muscle strengthening were discussed.   Primary osteoarthritis of both knees: He presents today with pain in both knee joints.  Warmth of both knee joints noted.  Effusion of the left knee joint.  Painful ROM of the left knee joint noted.  His left knee joint effusion was aspirated and injected with cortisone by his orthopedist 2 weeks ago.  He has been wearing a brace daily.  He will be restarting on orencia this week.  He was advised to notify us if his knee joint pain persists or worsens.    Primary osteoarthritis of both feet: He is not having any discomfort in his feet at this time.  He has good ROM of both ankle joints with no discomfort.  No tenderness or swelling of ankle joints noted.   Impingement syndrome of right shoulder: Doing well.  He has good ROM with no discomfort at this time.   DDD (degenerative disc disease), cervical: He has good ROM on exam with no discomfort.   History of CHF (congestive heart failure): According to the patient he was diagnosed with CHF in October 2021.  He currently has an Optometrist.  Anti-TNF inhibitors are contraindicated in patients with CHF.  Other medical conditions are listed as follows:   Family history of multiple sclerosis: Avoid the use of TNFi.   History of tremor  History of sleep apnea  History of depression  History of coronary artery  disease  Smoker   Orders: Orders Placed This Encounter  Procedures  . QuantiFERON-TB Gold Plus  . COMPLETE METABOLIC PANEL WITH GFR  . CBC with Differential/Platelet   Meds ordered this encounter  Medications  . Abatacept (ORENCIA CLICKJECT) 720 MG/ML SOAJ    Sig: Inject 125 mg into the skin once a week.    Dispense:  12 mL    Refill:  0      Follow-Up Instructions: Return in about 3 months (around 11/05/2020) for Rheumatoid arthritis, Osteoarthritis, DDD.   Ofilia Neas, PA-C  Note - This record has been created using Dragon software.  Chart creation errors have been sought, but may not always  have been located. Such creation errors do not reflect on  the standard of medical care.

## 2020-08-05 ENCOUNTER — Encounter: Payer: Self-pay | Admitting: Physician Assistant

## 2020-08-05 ENCOUNTER — Other Ambulatory Visit: Payer: Self-pay | Admitting: Physician Assistant

## 2020-08-05 ENCOUNTER — Other Ambulatory Visit: Payer: Self-pay

## 2020-08-05 ENCOUNTER — Ambulatory Visit: Payer: Medicare Other | Admitting: Physician Assistant

## 2020-08-05 VITALS — BP 99/71 | HR 102 | Resp 16 | Ht 74.0 in | Wt 244.4 lb

## 2020-08-05 DIAGNOSIS — M5030 Other cervical disc degeneration, unspecified cervical region: Secondary | ICD-10-CM

## 2020-08-05 DIAGNOSIS — M170 Bilateral primary osteoarthritis of knee: Secondary | ICD-10-CM

## 2020-08-05 DIAGNOSIS — M0579 Rheumatoid arthritis with rheumatoid factor of multiple sites without organ or systems involvement: Secondary | ICD-10-CM

## 2020-08-05 DIAGNOSIS — Z111 Encounter for screening for respiratory tuberculosis: Secondary | ICD-10-CM

## 2020-08-05 DIAGNOSIS — M19041 Primary osteoarthritis, right hand: Secondary | ICD-10-CM | POA: Diagnosis not present

## 2020-08-05 DIAGNOSIS — M7541 Impingement syndrome of right shoulder: Secondary | ICD-10-CM

## 2020-08-05 DIAGNOSIS — F17200 Nicotine dependence, unspecified, uncomplicated: Secondary | ICD-10-CM

## 2020-08-05 DIAGNOSIS — M19071 Primary osteoarthritis, right ankle and foot: Secondary | ICD-10-CM

## 2020-08-05 DIAGNOSIS — Z79899 Other long term (current) drug therapy: Secondary | ICD-10-CM

## 2020-08-05 DIAGNOSIS — M19042 Primary osteoarthritis, left hand: Secondary | ICD-10-CM

## 2020-08-05 DIAGNOSIS — Z8659 Personal history of other mental and behavioral disorders: Secondary | ICD-10-CM

## 2020-08-05 DIAGNOSIS — Z820 Family history of epilepsy and other diseases of the nervous system: Secondary | ICD-10-CM

## 2020-08-05 DIAGNOSIS — M19072 Primary osteoarthritis, left ankle and foot: Secondary | ICD-10-CM

## 2020-08-05 DIAGNOSIS — Z8669 Personal history of other diseases of the nervous system and sense organs: Secondary | ICD-10-CM

## 2020-08-05 DIAGNOSIS — Z8679 Personal history of other diseases of the circulatory system: Secondary | ICD-10-CM

## 2020-08-05 MED ORDER — ORENCIA CLICKJECT 125 MG/ML ~~LOC~~ SOAJ: 125.0000 mg | SUBCUTANEOUS | 0 refills | Status: DC

## 2020-08-05 NOTE — Patient Instructions (Signed)
Standing Labs We placed an order today for your standing lab work.   Please have your standing labs drawn in 1 month then every 3 months   If possible, please have your labs drawn 2 weeks prior to your appointment so that the provider can discuss your results at your appointment.  We have open lab daily Monday through Thursday from 8:30-12:30 PM and 1:30-4:30 PM and Friday from 8:30-12:30 PM and 1:30-4:00 PM at the office of Dr. Pollyann Savoy, [facility].   Please be advised, patients with office appointments requiring lab work will take precedents over walk-in lab work.  If possible, please come for your lab work on Monday and Friday afternoons, as you may experience shorter wait times. The office is located at [address], [address], [place], [place] [address] No appointment is necessary.   Labs are drawn by [facility]. Please bring your co-pay at the time of your lab draw.  You may receive a bill from [facility] for your lab work.  If you wish to have your labs drawn at another location, please call the office 24 hours in advance to send orders.  If you have any questions regarding directions or hours of operation,  please call [phone].   As a reminder, please drink plenty of water prior to coming for your lab work. Thanks!   COVID-19 vaccine recommendations:   COVID-19 vaccine is recommended for everyone (unless you are allergic to a vaccine component), even if you are on a medication that suppresses your immune system.   If you are on Methotrexate, Cellcept (mycophenolate), Rinvoq, Harriette Ohara, and Olumiant- hold the medication for 1 week after each vaccine. Hold Methotrexate for 2 weeks after the single dose COVID-19 vaccine.   If you are on Orencia subcutaneous injection - hold medication one week prior to and one week after the first COVID-19 vaccine dose (only).   If you are on Orencia IV infusions- time vaccination administration so that the first COVID-19  vaccination will occur four weeks after the infusion and postpone the subsequent infusion by one week.   If you are on Cyclophosphamide or Rituxan infusions please contact your doctor prior to receiving the COVID-19 vaccine.   Do not take Tylenol or any anti-inflammatory medications (NSAIDs) 24 hours prior to the COVID-19 vaccination.   There is no direct evidence about the efficacy of the COVID-19 vaccine in individuals who are on medications that suppress the immune system.   Even if you are fully vaccinated, and you are on any medications that suppress your immune system, please continue to wear a mask, maintain at least six feet social distance and practice hand hygiene.   If you develop a COVID-19 infection, please contact your PCP or our office to determine if you need monoclonal antibody infusion.  The booster vaccine is now available for immunocompromised patients.   Please see the following web sites for updated information.   [email or web address]

## 2020-08-22 ENCOUNTER — Telehealth: Payer: Self-pay

## 2020-08-22 NOTE — Telephone Encounter (Signed)
Patient called checking the status of his Orencia medication.  Patient is requesting a return call.

## 2020-08-23 MED FILL — ORENCIA CLICKJECT 125 MG/ML: 125 | 28 days supply | Qty: 4 | Fill #0

## 2020-08-23 NOTE — Telephone Encounter (Signed)
Prescription sent to the pharmacy on 08/05/2020. Reach out to Ronald Pippins, pharmacy technician to verify it was received. She states they did receive the prescription. Contacted the patient and advised. Provided with pharmacy number so he may set up shipment.

## 2020-09-20 MED FILL — ORENCIA CLICKJECT 125 MG/ML: 125 | 28 days supply | Qty: 4 | Fill #1

## 2020-10-07 ENCOUNTER — Telehealth: Payer: Self-pay | Admitting: *Deleted

## 2020-10-07 NOTE — Telephone Encounter (Signed)
Labs received from: [place] [facility] on: 10/06/2020 Reviewed by: Hazel Sams, PA-C  Labs drawn: UA, CBC, CMP, ESR  Results: ESR: 30, Glucose 122  Patient on Orencia 125 mg weekly.   Per Lovena Le, Hx of elevated sed rate. ESR 52 on 07/29/2020. Improving.

## 2020-10-17 MED FILL — ORENCIA CLICKJECT 125 MG/ML: 125 | 28 days supply | Qty: 4 | Fill #2

## 2020-10-25 NOTE — Progress Notes (Deleted)
Office Visit Note  Patient: Dylan Dudley             Date of Birth: [birth year]           MRN: [id number]             PCP: Elise Benne, MD Referring: Elise Benne, MD Visit Date: 11/08/2020 Occupation: @GUAROCC @  Subjective:  No chief complaint on file.   History of Present Illness: Dylan Dudley is a 60 y.o. male ***   Activities of Daily Living:  Patient reports morning stiffness for *** {minute/hour:19697}.   Patient {ACTIONS;DENIES/REPORTS:21021675::"Denies"} nocturnal pain.  Difficulty dressing/grooming: {ACTIONS;DENIES/REPORTS:21021675::"Denies"} Difficulty climbing stairs: {ACTIONS;DENIES/REPORTS:21021675::"Denies"} Difficulty getting out of chair: {ACTIONS;DENIES/REPORTS:21021675::"Denies"} Difficulty using hands for taps, buttons, cutlery, and/or writing: {ACTIONS;DENIES/REPORTS:21021675::"Denies"}  No Rheumatology ROS completed.   PMFS History:  Patient Active Problem List   Diagnosis Date Noted  . Rheumatoid arthritis with rheumatoid factor of multiple sites without organ or systems involvement (HCC) 09/10/2016  . Primary osteoarthritis of both hands 09/10/2016  . Primary osteoarthritis of both knees 09/10/2016  . Primary osteoarthritis of both feet 09/10/2016  . DJD (degenerative joint disease), cervical 09/10/2016  . History of tremor 09/10/2016  . History of sleep apnea 09/10/2016  . Coronary artery disease involving native heart 09/10/2016  . Smoker 09/10/2016  . High risk medication use 09/10/2016  . Tear of right rotator cuff 03/14/2012  . Impingement syndrome of right shoulder 03/14/2012  . AC (acromioclavicular) arthritis, right 03/14/2012  . Disorder of tendon of biceps 03/14/2012    Past Medical History:  Diagnosis Date  . AC (acromioclavicular) arthritis 03/14/2012  . Anxiety   . Arthritis    "shoulders; knees" (07/17/2013)  . Congestive heart failure (CHF) (HCC)    per patient   . DDD (degenerative disc disease)   .  Depression   . Disorder of tendon of biceps 03/14/2012  . GERD (gastroesophageal reflux disease)   . Hyperlipemia   . Hypertension   . Impingement syndrome of right shoulder 03/14/2012  . Pneumonia 2012   "once" (07/17/2013)  . Rheumatoid arthritis (HCC)   . Seasonal allergies   . Sleep apnea    "did test 2 months ago; get results in December" (07/17/2013)  . Tear of right rotator cuff 03/14/2012    Family History  Problem Relation Age of Onset  . Breast cancer Mother   . Hypertension Mother   . Hypertension Father   . Multiple sclerosis Sister    Past Surgical History:  Procedure Laterality Date  . ANTERIOR CERVICAL DECOMP/DISCECTOMY FUSION  08/2011   [place]  . BUNIONECTOMY Bilateral 1998  . DEBRIDEMENT AND CLOSURE WOUND  12/2012   "back of my neck" (07/17/2013)  . DEBRIDEMENT AND CLOSURE WOUND N/A 07/17/2013   Procedure: DEBRIDEMENT  OF SKIN, SUBCUTANEOUS TISSUE BONE, MUSCLE FLAP, AND CLOSURE WOUND;  Surgeon: 07/19/2013, MD;  Location: [facility] OR;  Service: Plastics;  Laterality: N/A;  . EXCISIONAL HEMORRHOIDECTOMY  ~ 1996  . INCISION AND DRAINAGE OF WOUND  10/2012 X 2   "back of my neck" (07/17/2013)  . MUSCLE FLAP CLOSURE  07/17/2013   "to back of my neck" (07/17/2013)  . NECK SURGERY  09/2012   "spur removed from back of my neck" (07/17/2013)  . SHOULDER ARTHROSCOPY Left 2008  . SHOULDER ARTHROSCOPY W/ ROTATOR CUFF REPAIR Right 2013   "and bicep repair" (07/17/2013)  . TONSILLECTOMY  ~ 38   Social History   Social History Narrative  . Not on file  Immunization History  Administered Date(s) Administered  . Influenza,inj,Quad PF,6+ Mos 07/18/2013  . Moderna Sars-Covid-2 Vaccination 10/31/2019, 11/28/2019, 07/22/2020  . Pneumococcal Polysaccharide-23 07/18/2013  . Tdap 11/28/2006, 04/30/2014     Objective: Vital Signs: There were no vitals taken for this visit.   Physical Exam   Musculoskeletal Exam: ***  CDAI Exam: CDAI Score: - Patient Global: -;  Provider Global: - Swollen: -; Tender: - Joint Exam 11/08/2020   No joint exam has been documented for this visit   There is currently no information documented on the homunculus. Go to the Rheumatology activity and complete the homunculus joint exam.  Investigation: No additional findings.  Imaging: No results found.  Recent Labs: Lab Results  Component Value Date   WBC 5.5 12/17/2019   HGB 14.4 12/17/2019   PLT 243 12/17/2019   NA 140 12/17/2019   K 3.9 12/17/2019   CL 102 12/17/2019   CO2 30 12/17/2019   GLUCOSE 98 12/17/2019   BUN 18 12/17/2019   CREATININE 1.17 12/17/2019   BILITOT 0.7 12/17/2019   ALKPHOS 68 01/10/2017   AST 19 12/17/2019   ALT 19 12/17/2019   PROT 7.0 12/17/2019   ALBUMIN 4.6 01/10/2017   CALCIUM 9.4 12/17/2019   GFRAA 79 12/17/2019   QFTBGOLDPLUS NEGATIVE 10/20/2019    Speciality Comments: PLQ Eye Exam: 11/19/2018 WNL @ [facility] Follow up in 1 year  Procedures:  No procedures performed Allergies: Codeine, Daptomycin, Hydroxyzine, Latex, and Vancomycin   Assessment / Plan:     Visit Diagnoses: No diagnosis found.  Orders: No orders of the defined types were placed in this encounter.  No orders of the defined types were placed in this encounter.   Face-to-face time spent with patient was *** minutes. Greater than 50% of time was spent in counseling and coordination of care.  Follow-Up Instructions: No follow-ups on file.   Ellen Henri, CMA  Note - This record has been created using Animal nutritionist.  Chart creation errors have been sought, but may not always  have been located. Such creation errors do not reflect on  the standard of medical care.

## 2020-11-08 ENCOUNTER — Ambulatory Visit: Payer: Medicare Other | Admitting: Rheumatology

## 2020-11-08 DIAGNOSIS — F17200 Nicotine dependence, unspecified, uncomplicated: Secondary | ICD-10-CM

## 2020-11-08 DIAGNOSIS — Z79899 Other long term (current) drug therapy: Secondary | ICD-10-CM

## 2020-11-08 DIAGNOSIS — Z8669 Personal history of other diseases of the nervous system and sense organs: Secondary | ICD-10-CM

## 2020-11-08 DIAGNOSIS — Z820 Family history of epilepsy and other diseases of the nervous system: Secondary | ICD-10-CM

## 2020-11-08 DIAGNOSIS — M5030 Other cervical disc degeneration, unspecified cervical region: Secondary | ICD-10-CM

## 2020-11-08 DIAGNOSIS — M0579 Rheumatoid arthritis with rheumatoid factor of multiple sites without organ or systems involvement: Secondary | ICD-10-CM

## 2020-11-08 DIAGNOSIS — Z8679 Personal history of other diseases of the circulatory system: Secondary | ICD-10-CM

## 2020-11-08 DIAGNOSIS — M170 Bilateral primary osteoarthritis of knee: Secondary | ICD-10-CM

## 2020-11-08 DIAGNOSIS — M7541 Impingement syndrome of right shoulder: Secondary | ICD-10-CM

## 2020-11-08 DIAGNOSIS — Z8659 Personal history of other mental and behavioral disorders: Secondary | ICD-10-CM

## 2020-11-08 DIAGNOSIS — M19071 Primary osteoarthritis, right ankle and foot: Secondary | ICD-10-CM

## 2020-11-08 DIAGNOSIS — M19041 Primary osteoarthritis, right hand: Secondary | ICD-10-CM

## 2020-11-09 ENCOUNTER — Other Ambulatory Visit: Payer: Self-pay | Admitting: Physician Assistant

## 2020-11-09 NOTE — Telephone Encounter (Signed)
Ideally he should have TB gold drawn ASAP but discussed with Dr. Corliss Skains and it is ok to refill orencia.

## 2020-11-09 NOTE — Telephone Encounter (Signed)
Last Visit: 08/05/2020 Next Visit: 11/28/2020 Labs: 10/06/2020  ESR: 30, Glucose 122 TB Gold:  10/20/19 Neg   Current Dose per office note 08/05/2020: Orencia 125 mg sq injections once weekly  DX: Rheumatoid arthritis with rheumatoid factor of multiple sites without organ or systems involvement   Last Fill: 08/05/2020  Patient to update TB Gold at appointment on 11/28/2020  Okay to refill Orencia? 

## 2020-11-14 NOTE — Progress Notes (Signed)
Office Visit Note  Patient: Dylan Dudley             Date of Birth: July 21, 1961           MRN: [id number]             PCP: Elise Benne, MD Referring: Elise Benne, MD Visit Date: 11/28/2020 Occupation: @GUAROCC @  Subjective:  Medication monitoring (Left knee replacement scheduled for 12/13/2020)   History of Present Illness: Dylan Dudley is a 60 y.o. male with a history of seropositive rheumatoid arthritis, osteoarthritis, and DDD. He is currently taking orencia 125 mg/mL every week. He notes he is tolerating the medication well but is unsure if it is making a difference. He denies any joint swelling, pain or stiffness in his hands. He has had pain, stiffness and crepitation in his left knee. He is scheduled for a knee replacement on 12/13/2020. He rates his RA as 0/10. He denies any SOB, dry eyes, dry mouth, sores or new rashes.   Activities of Daily Living:  Patient reports morning stiffness for 5-10 minutes.   Patient Reports nocturnal pain.  Difficulty dressing/grooming: Denies Difficulty climbing stairs: Reports Difficulty getting out of chair: Reports Difficulty using hands for taps, buttons, cutlery, and/or writing: Denies  Review of Systems  Constitutional: Positive for fatigue.  HENT: Negative for mouth sores, mouth dryness and nose dryness.   Eyes: Negative for pain, itching and dryness.  Respiratory: Negative for shortness of breath and difficulty breathing.   Cardiovascular: Negative for chest pain and palpitations.  Gastrointestinal: Negative for blood in stool, constipation and diarrhea.  Endocrine: Negative for increased urination.  Genitourinary: Negative for difficulty urinating.  Musculoskeletal: Positive for arthralgias, joint pain and morning stiffness. Negative for joint swelling, myalgias, muscle tenderness and myalgias.  Skin: Negative for color change, rash and redness.  Allergic/Immunologic: Negative for susceptible to infections.   Neurological: Negative for dizziness, numbness, headaches, memory loss and weakness.  Hematological: Positive for bruising/bleeding tendency.  Psychiatric/Behavioral: Negative for confusion.    PMFS History:  Patient Active Problem List   Diagnosis Date Noted  . Rheumatoid arthritis with rheumatoid factor of multiple sites without organ or systems involvement (HCC) 09/10/2016  . Primary osteoarthritis of both hands 09/10/2016  . Primary osteoarthritis of both knees 09/10/2016  . Primary osteoarthritis of both feet 09/10/2016  . DJD (degenerative joint disease), cervical 09/10/2016  . History of tremor 09/10/2016  . History of sleep apnea 09/10/2016  . Coronary artery disease involving native heart 09/10/2016  . Smoker 09/10/2016  . High risk medication use 09/10/2016  . Tear of right rotator cuff 03/14/2012  . Impingement syndrome of right shoulder 03/14/2012  . AC (acromioclavicular) arthritis, right 03/14/2012  . Disorder of tendon of biceps 03/14/2012    Past Medical History:  Diagnosis Date  . AC (acromioclavicular) arthritis 03/14/2012  . Anxiety   . Arthritis    "shoulders; knees" (07/17/2013)  . Congestive heart failure (CHF) (HCC)    per patient   . DDD (degenerative disc disease)   . Depression   . Disorder of tendon of biceps 03/14/2012  . GERD (gastroesophageal reflux disease)   . Hyperlipemia   . Hypertension   . Impingement syndrome of right shoulder 03/14/2012  . Pneumonia 2012   "once" (07/17/2013)  . Rheumatoid arthritis (HCC)   . Seasonal allergies   . Sleep apnea    "did test 2 months ago; get results in December" (07/17/2013)  . Tear of right rotator cuff  03/14/2012    Family History  Problem Relation Age of Onset  . Breast cancer Mother   . Hypertension Mother   . Hypertension Father   . Multiple sclerosis Sister    Past Surgical History:  Procedure Laterality Date  . ANTERIOR CERVICAL DECOMP/DISCECTOMY FUSION  08/2011   [place]  .  BUNIONECTOMY Bilateral 1998  . DEBRIDEMENT AND CLOSURE WOUND  12/2012   "back of my neck" (07/17/2013)  . DEBRIDEMENT AND CLOSURE WOUND N/A 07/17/2013   Procedure: DEBRIDEMENT  OF SKIN, SUBCUTANEOUS TISSUE BONE, MUSCLE FLAP, AND CLOSURE WOUND;  Surgeon: Irene Limbo, MD;  Location: [facility];  Service: Plastics;  Laterality: N/A;  . EXCISIONAL HEMORRHOIDECTOMY  ~ 1996  . INCISION AND DRAINAGE OF WOUND  10/2012 X 2   "back of my neck" (07/17/2013)  . MUSCLE FLAP CLOSURE  07/17/2013   "to back of my neck" (07/17/2013)  . NECK SURGERY  09/2012   "spur removed from back of my neck" (07/17/2013)  . SHOULDER ARTHROSCOPY Left 2008  . SHOULDER ARTHROSCOPY W/ ROTATOR CUFF REPAIR Right 2013   "and bicep repair" (07/17/2013)  . TONSILLECTOMY  ~ 52   Social History   Social History Narrative  . Not on file   Immunization History  Administered Date(s) Administered  . Influenza,inj,Quad PF,6+ Mos 07/18/2013  . Moderna Sars-Covid-2 Vaccination 10/31/2019, 11/28/2019, 07/22/2020  . Pneumococcal Polysaccharide-23 07/18/2013  . Tdap 11/28/2006, 04/30/2014     Objective: Vital Signs: BP 108/67 (BP Location: Left Arm, Patient Position: Sitting, Cuff Size: Large)   Pulse (!) 56   Resp 17   Ht $R'6\' 2"'pj$  (1.88 m)   Wt 258 lb (117 kg)   BMI 33.13 kg/m    Physical Exam Vitals and nursing note reviewed.  Constitutional:      Appearance: He is well-developed and well-nourished.  HENT:     Head: Normocephalic and atraumatic.  Eyes:     Extraocular Movements: EOM normal.     Conjunctiva/sclera: Conjunctivae normal.     Pupils: Pupils are equal, round, and reactive to light.  Cardiovascular:     Rate and Rhythm: Normal rate and regular rhythm.     Heart sounds: Normal heart sounds.  Pulmonary:     Effort: Pulmonary effort is normal.     Breath sounds: Normal breath sounds.  Abdominal:     General: Bowel sounds are normal.     Palpations: Abdomen is soft.  Musculoskeletal:     Cervical back:  Normal range of motion and neck supple.  Skin:    General: Skin is warm and dry.     Capillary Refill: Capillary refill takes less than 2 seconds.  Neurological:     Mental Status: He is alert and oriented to person, place, and time.  Psychiatric:        Mood and Affect: Mood and affect normal.        Behavior: Behavior normal.      Musculoskeletal Exam: He good range of motion of the C-spine with some stiffness.  Shoulder joints, elbow joints, wrist joints with good range of motion.  He had no MCP swelling or synovitis.  Hip joints with good range of motion.  He discomfort range of motion of his left knee joint with some warmth on palpation.  Right knee joint was in good range of motion.  There was no tenderness over ankles or MTPs.  CDAI Exam: CDAI Score: 0  Patient Global: 0 mm; Provider Global: 0 mm Swollen: 0 ; Tender: 0  Joint Exam 11/28/2020   No joint exam has been documented for this visit   There is currently no information documented on the homunculus. Go to the Rheumatology activity and complete the homunculus joint exam.  Investigation: No additional findings.  Imaging: No results found.  Recent Labs: Lab Results  Component Value Date   WBC 5.5 12/17/2019   HGB 14.4 12/17/2019   PLT 243 12/17/2019   NA 140 12/17/2019   K 3.9 12/17/2019   CL 102 12/17/2019   CO2 30 12/17/2019   GLUCOSE 98 12/17/2019   BUN 18 12/17/2019   CREATININE 1.17 12/17/2019   BILITOT 0.7 12/17/2019   ALKPHOS 68 01/10/2017   AST 19 12/17/2019   ALT 19 12/17/2019   PROT 7.0 12/17/2019   ALBUMIN 4.6 01/10/2017   CALCIUM 9.4 12/17/2019   GFRAA 79 12/17/2019   QFTBGOLDPLUS NEGATIVE 10/20/2019   Results received from [facility] November 16, 2020: WBC 3.9 Hgb 13.6 Hgb A1C 5.9  BUN 28 Creatinine 1.5 eGFR 58 ESR 22  Speciality Comments: PLQ Eye Exam: 11/19/2018 WNL @ [facility] Follow up in 1 year  Procedures:  No procedures performed Allergies: Codeine, Daptomycin,  Hydroxyzine, Latex, and Vancomycin   Assessment / Plan:     Visit Diagnoses: Rheumatoid arthritis with rheumatoid factor of multiple sites without organ or systems involvement ([place]) - +RF, +CCP, elevated ESR. He has been taking Orencia 125 mg/mL subq injection every week. He has not missed any doses. He denies any flares of joint swelling, pain or stiffness in his hands or feet since the last visit. There were no signs of synovitis seen on exam. He has not been able to exercise due to pain in his left knee.  He is scheduled to have left total knee replacement by Dr. Mardelle Matte.  After he is healed from the procedure he will return to exercising. I discussed the importance of staying active and following a healthy diet due to increased risk of CV disease in patients with RA. I counseled him to cease his Orencia medications for 2 weeks prior to his procedure and to hold for 2 weeks after his procedure. If he is healing well and has no infection he can restart after two weeks once he gets clearance from Dr. Mardelle Matte.   High risk medication use - Orencia 125 mg sq injections once weekly monotherapy. D/c PLQ due to inadequate response and d/c low-dose methotrexate (4 tablets weekly) due to elevated creatinine.   - Plan: QuantiFERON-TB Gold Plus. Update TB gold today. CBC and CMP were faxed from [facility]. Reviewed lab results with the patient.   Primary osteoarthritis of both hands- X-ray on 03/17/2020 showed CMC, PIP and DIP narrowing consistent with osteoarthritis. Pt denies any stiffness in his hands currently.  Primary osteoarthritis of both knees- Severe medial compartment narrowing with medial and lateral osteophytes and severe patellofemoral narrowing seen on X-ray on 03/17/2020. Pt continues to have stiffness and pain in his left knee. He is scheduled for a total knee replacement later this month. I counseled him on holding Orencia before and after the procedure. He will need clearance from his surgeon to  restart Orencia. He says his right knee is doing well.   Primary osteoarthritis of both feet- X-ray showed severe narrowing of first MTP joint and PIP and DIP narrowing were seen on X-ray on 03/17/2020. Patient has not had any joint pain or stiffness.   Impingement syndrome of right shoulder- stable. He has some stiffness in his  right shoulder during overhead movements.   DDD (degenerative disc disease), cervical- stable. Patient had some stiffness with ROM of cervical spine.   Family history of multiple sclerosis - Avoid the use of TNFi.   History of depression  History of tremor- Symptoms have resolved.   History of sleep apnea  History of CHF (congestive heart failure) - According to the patient he was diagnosed with CHF in October 2021.   History of coronary artery disease- encouraged the patient to follow a healthy heart diet.   Former smoker - 1/2 PPD X 40 years, quit 06/2020.  Educated about COVID-19 virus infection-he is fully vaccinated against COVID-19.  He also had third dose.  Use of fourth dose (booster), 3 months after the third dose was recommended in individuals on immunosuppressive therapy was discussed.  Of mask, social distancing and hand hygiene was discussed.  Orders: Orders Placed This Encounter  Procedures  . QuantiFERON-TB Gold Plus   No orders of the defined types were placed in this encounter.    Follow-Up Instructions: Return in about 5 months (around 04/30/2021) for Rheumatoid arthritis, Osteoarthritis.   Bo Merino, MD  Note - This record has been created using Editor, commissioning.  Chart creation errors have been sought, but may not always  have been located. Such creation errors do not reflect on  the standard of medical care.

## 2020-11-15 ENCOUNTER — Telehealth: Payer: Self-pay | Admitting: Pharmacist

## 2020-11-15 MED FILL — ORENCIA CLICKJECT 125 MG/ML: 125 | 28 days supply | Qty: 4 | Fill #0

## 2020-11-15 NOTE — Telephone Encounter (Signed)
Received notification from [facility] regarding a prior authorization renewal for [facility]. Authorization has been APPROVED from 08/17/20 to 11/15/21.   Phone # [phone]  Chesley Mires, PharmD, MPH Clinical Pharmacist (Rheumatology and Pulmonology)

## 2020-11-21 ENCOUNTER — Telehealth: Payer: Self-pay | Admitting: *Deleted

## 2020-11-21 DIAGNOSIS — Z79899 Other long term (current) drug therapy: Secondary | ICD-10-CM

## 2020-11-21 DIAGNOSIS — M0579 Rheumatoid arthritis with rheumatoid factor of multiple sites without organ or systems involvement: Secondary | ICD-10-CM

## 2020-11-21 NOTE — Telephone Encounter (Signed)
Labs received from:[facility] Drawn on:11/16/2020 Reviewed by:Hazel Sams, PA-C  Labs drawn:CBC, CMP  Results:WBC 3.9 Hgb 13.6 Hgb A1C 5.9 Glucose 10.5 BUN 28 Creatinine 1.5 eGFR 58 ESR 22  WBC count is low, recommend rechecking CBC w/diff in 1 month.

## 2020-11-28 ENCOUNTER — Ambulatory Visit (INDEPENDENT_AMBULATORY_CARE_PROVIDER_SITE_OTHER): Payer: Medicare Other | Admitting: Rheumatology

## 2020-11-28 ENCOUNTER — Other Ambulatory Visit: Payer: Self-pay

## 2020-11-28 ENCOUNTER — Encounter: Payer: Self-pay | Admitting: Rheumatology

## 2020-11-28 VITALS — BP 108/67 | HR 56 | Resp 17 | Ht 74.0 in | Wt 258.0 lb

## 2020-11-28 DIAGNOSIS — Z87891 Personal history of nicotine dependence: Secondary | ICD-10-CM

## 2020-11-28 DIAGNOSIS — M170 Bilateral primary osteoarthritis of knee: Secondary | ICD-10-CM

## 2020-11-28 DIAGNOSIS — Z8669 Personal history of other diseases of the nervous system and sense organs: Secondary | ICD-10-CM

## 2020-11-28 DIAGNOSIS — Z79899 Other long term (current) drug therapy: Secondary | ICD-10-CM | POA: Diagnosis not present

## 2020-11-28 DIAGNOSIS — M0579 Rheumatoid arthritis with rheumatoid factor of multiple sites without organ or systems involvement: Secondary | ICD-10-CM

## 2020-11-28 DIAGNOSIS — Z7189 Other specified counseling: Secondary | ICD-10-CM

## 2020-11-28 DIAGNOSIS — M5030 Other cervical disc degeneration, unspecified cervical region: Secondary | ICD-10-CM

## 2020-11-28 DIAGNOSIS — M19071 Primary osteoarthritis, right ankle and foot: Secondary | ICD-10-CM

## 2020-11-28 DIAGNOSIS — Z8679 Personal history of other diseases of the circulatory system: Secondary | ICD-10-CM

## 2020-11-28 DIAGNOSIS — M19041 Primary osteoarthritis, right hand: Secondary | ICD-10-CM

## 2020-11-28 DIAGNOSIS — Z820 Family history of epilepsy and other diseases of the nervous system: Secondary | ICD-10-CM

## 2020-11-28 DIAGNOSIS — Z8659 Personal history of other mental and behavioral disorders: Secondary | ICD-10-CM

## 2020-11-28 DIAGNOSIS — M19072 Primary osteoarthritis, left ankle and foot: Secondary | ICD-10-CM

## 2020-11-28 DIAGNOSIS — M7541 Impingement syndrome of right shoulder: Secondary | ICD-10-CM

## 2020-11-28 DIAGNOSIS — M19042 Primary osteoarthritis, left hand: Secondary | ICD-10-CM

## 2020-11-28 DIAGNOSIS — M1712 Unilateral primary osteoarthritis, left knee: Secondary | ICD-10-CM | POA: Diagnosis present

## 2020-11-28 DIAGNOSIS — F17200 Nicotine dependence, unspecified, uncomplicated: Secondary | ICD-10-CM

## 2020-11-28 NOTE — Patient Instructions (Addendum)
COVID-19 vaccine recommendations:   COVID-19 vaccine is recommended for everyone (unless you are allergic to a vaccine component), even if you are on a medication that suppresses your immune system.   If you are on Orencia subcutaneous injection - hold medication one week prior to and one week after the first COVID-19 vaccine dose (only).   The recommendations are for individuals on immunosuppressive agents should receive first 3 COVID-19 vaccines 1 month apart and then a fourth dose (booster) 6 months after the third dose.  Do not take Tylenol or any anti-inflammatory medications (NSAIDs) 24 hours prior to the COVID-19 vaccination.   There is no direct evidence about the efficacy of the COVID-19 vaccine in individuals who are on medications that suppress the immune system.   Even if you are fully vaccinated, and you are on any medications that suppress your immune system, please continue to wear a mask, maintain at least six feet social distance and practice hand hygiene.   If you develop a COVID-19 infection, please contact your PCP or our office to determine if you need monoclonal antibody infusion.  The booster vaccine is now available for immunocompromised patients.   Please see the following web sites for updated information.   [email or web address]  Standing Labs We placed an order today for your standing lab work.   Please have your standing labs drawn in May and every 3 months  If possible, please have your labs drawn 2 weeks prior to your appointment so that the provider can discuss your results at your appointment.  We have open lab daily Monday through Thursday from 1:30-4:30 PM and Friday from 1:30-4:00 PM at the office of Dr. Pollyann Savoy, [facility].   Please be advised, all patients with office appointments requiring lab work will take precedents over walk-in lab work.  If possible,  please come for your lab work on Monday and Friday afternoons, as you may experience shorter wait times. The office is located at [address], [place], [place] [address] No appointment is necessary.   Labs are drawn by [facility]. Please bring your co-pay at the time of your lab draw.  You may receive a bill from [facility] for your lab work.  If you wish to have your labs drawn at another location, please call the office 24 hours in advance to send orders.  If you have any questions regarding directions or hours of operation,  please call [phone].   As a reminder, please drink plenty of water prior to coming for your lab work. Thanks!   Recommendations are to stop Orencia 2 weeks prior to the surgery and restart 2 weeks later if there are no signs of infection and the healing is going well.  He will need clearance from his surgeon.  Vaccines You are taking a medication(s) that can suppress your immune system.  The following immunizations are recommended: . Flu annually . Covid-19  . Pneumonia (Pneumovax 23 and Prevnar 13 spaced at least 1 year apart) . Shingrix (after age 57)  Please check with your PCP to make sure you are up to date.   Heart Disease Prevention   Your inflammatory disease increases your risk of heart disease which includes heart attack, stroke, atrial fibrillation (irregular heartbeats), high blood pressure, heart failure and atherosclerosis (plaque in the arteries).  It is important to reduce your risk by:   . Keep blood pressure, cholesterol, and blood sugar at healthy levels   . Smoking Cessation   .  Maintain a healthy weight  o BMI 20-25   . Eat a healthy diet  o Plenty of fresh fruit, vegetables, and whole grains  o Limit saturated fats, foods high in sodium, and added sugars  o DASH and Mediterranean diet   . Increase physical activity  o Recommend moderate physically activity for 150 minutes per week/ 30 minutes a day for five days a week  These can be broken up into three separate ten-minute sessions during the day.   . Reduce Stress  . Meditation, slow breathing exercises, yoga, coloring books  . Dental visits twice a year

## 2020-11-29 NOTE — H&P (Signed)
KNEE ARTHROPLASTY ADMISSION H&P  Patient ID: Dylan Dudley MRN: [id number] DOB/AGE: February 25, 1961 60 y.o.  Chief Complaint: left knee pain.  Planned Procedure Date: 12/13/20 Medical Clearance by Dr. Vernell Leep   Cardiac Clearance by Dr. Hyacinth Meeker Additional clearance by Dr. Corliss Skains  HPI: Dylan Dudley is a 60 y.o. male who presents for evaluation of djd left knee. The patient has a history of pain and functional disability in the left knee due to arthritis and has failed non-surgical conservative treatments for greater than 12 weeks to include NSAID's and/or analgesics, corticosteriod injections and activity modification.  Onset of symptoms was gradual, starting 10 years ago with gradually worsening course since that time. The patient noted no past surgery on the left knee.  Patient currently rates pain at 5 out of 10 with activity. Patient has worsening of pain with activity and weight bearing, pain that interferes with activities of daily living and joint swelling.  Patient has evidence of joint space narrowing by imaging studies.  There is no active infection.  Past Medical History:  Diagnosis Date  . AC (acromioclavicular) arthritis 03/14/2012  . Anxiety   . Arthritis    "shoulders; knees" (07/17/2013)  . Congestive heart failure (CHF) (HCC)    per patient   . DDD (degenerative disc disease)   . Depression   . Disorder of tendon of biceps 03/14/2012  . GERD (gastroesophageal reflux disease)   . Hyperlipemia   . Hypertension   . Impingement syndrome of right shoulder 03/14/2012  . Pneumonia 2012   "once" (07/17/2013)  . Rheumatoid arthritis (HCC)   . Seasonal allergies   . Sleep apnea    "did test 2 months ago; get results in December" (07/17/2013)  . Tear of right rotator cuff 03/14/2012   Past Surgical History:  Procedure Laterality Date  . ANTERIOR CERVICAL DECOMP/DISCECTOMY FUSION  08/2011   [place]  . BUNIONECTOMY Bilateral 1998  . DEBRIDEMENT AND CLOSURE WOUND   12/2012   "back of my neck" (07/17/2013)  . DEBRIDEMENT AND CLOSURE WOUND N/A 07/17/2013   Procedure: DEBRIDEMENT  OF SKIN, SUBCUTANEOUS TISSUE BONE, MUSCLE FLAP, AND CLOSURE WOUND;  Surgeon: Glenna Fellows, MD;  Location: [facility] OR;  Service: Plastics;  Laterality: N/A;  . EXCISIONAL HEMORRHOIDECTOMY  ~ 1996  . INCISION AND DRAINAGE OF WOUND  10/2012 X 2   "back of my neck" (07/17/2013)  . MUSCLE FLAP CLOSURE  07/17/2013   "to back of my neck" (07/17/2013)  . NECK SURGERY  09/2012   "spur removed from back of my neck" (07/17/2013)  . SHOULDER ARTHROSCOPY Left 2008  . SHOULDER ARTHROSCOPY W/ ROTATOR CUFF REPAIR Right 2013   "and bicep repair" (07/17/2013)  . TONSILLECTOMY  ~ 1974   Allergies  Allergen Reactions  . Codeine     Pt not sure why this is listed  . Daptomycin Hives and Itching  . Hydroxyzine Swelling    Other reaction(s): Lip swelling  . Latex Hives and Itching  . Vancomycin Hives and Itching   Prior to Admission medications   Medication Sig Start Date End Date Taking? Authorizing Provider  albuterol (ACCUNEB) 0.63 MG/3ML nebulizer solution Take 1 ampule by nebulization every 6 (six) hours as needed for shortness of breath or wheezing. 06/30/20  Yes [provider]  aspirin 81 MG tablet Take 81 mg by mouth daily.   Yes [provider]  buPROPion (WELLBUTRIN SR) 150 MG 12 hr tablet Take 150 mg by mouth daily.   Yes [provider]  carvedilol (COREG) 6.25 MG tablet Take 6.25 mg by mouth 2 (two) times daily. 07/18/20  Yes [provider]  chlorthalidone (HYGROTON) 25 MG tablet Take 12.5 mg by mouth 2 (two) times daily. 02/04/17  Yes [provider]  cholecalciferol (VITAMIN D) 25 MCG (1000 UNIT) tablet Take 1,000 Units by mouth daily.   Yes [provider]  ELIQUIS 2.5 MG TABS tablet Take 2.5 mg by mouth 2 (two) times daily. 07/22/20  Yes [provider]  loratadine (CLARITIN) 10 MG tablet Take 10 mg by mouth daily.  12/17/18  Yes [provider]  Dub [place]is CLICKJECT 125 MG/ML SOAJ INJECT 125 MG INTO THE SKIN ONCE A WEEK. 11/09/20  Yes Gearldine Bienenstock, PA-C  rosuvastatin (CRESTOR) 20 MG tablet Take 20 mg by mouth at bedtime. 07/18/20  Yes [provider]  SYMBICORT 160-4.5 MCG/ACT inhaler Inhale 2 puffs into the lungs in the morning and at bedtime. 07/20/20  Yes [provider]  traZODone (DESYREL) 100 MG tablet Take 100 mg by mouth at bedtime as needed for sleep. 07/07/16  Yes [provider]  benzonatate (TESSALON) 200 MG capsule Take by mouth as needed. 05/24/20   [provider]  fluticasone (FLONASE) 50 MCG/ACT nasal spray Place into the nose as needed. 12/17/18   [provider]  pantoprazole (PROTONIX) 20 MG tablet Take 20 mg by mouth daily.    [provider]  sacubitril-valsartan (ENTRESTO) 97-103 MG Take 1 tablet by mouth 2 (two) times daily.    [provider]  sildenafil (VIAGRA) 100 MG tablet Take 100 mg by mouth daily as needed for erectile dysfunction.    [provider]   Social History   Socioeconomic History  . Marital status: Divorced    Spouse name: Not on file  . Number of children: Not on file  . Years of education: Not on file  . Highest education level: Not on file  Occupational History  . Not on file  Tobacco Use  . Smoking status: Former Smoker    Packs/day: 0.10    Years: 40.00    Pack years: 4.00    Types: Cigarettes  . Smokeless tobacco: Never Used  Vaping Use  . Vaping Use: Never used  Substance and Sexual Activity  . Alcohol use: Yes    Comment: occ  . Drug use: No  . Sexual activity: Yes  Other Topics Concern  . Not on file  Social History Narrative  . Not on file   Social Determinants of Health   Financial Resource Strain: Not on file  Food Insecurity: Not on file  Transportation Needs: Not on file  Physical Activity: Not on file  Stress: Not on file  Social Connections: Not on  file   Family History  Problem Relation Age of Onset  . Breast cancer Mother   . Hypertension Mother   . Hypertension Father   . Multiple sclerosis Sister     ROS: Currently denies lightheadedness, dizziness, Fever, chills, CP, SOB. No personal history of DVT, PE, MI, or CVA No loose teeth or dentures All other systems have been reviewed and were otherwise currently negative with the exception of those mentioned in the HPI and as above.  Objective: Vitals: Ht: 6\' 1"  Wt: 255 lbs Temp: 98.3 F BP: 134/81 Pulse: 54 bpm O2 98% on room air.   Physical Exam: General: Alert, NAD.  HEENT: EOMI, Good Neck Extension  Pulm: No increased work of breathing.  Clear B/L A/P w/o crackle  or wheeze.  CV: RRR, No m/g/r appreciated  GI: soft, NT, ND abdomen. Normal bowel sounds. Neuro: Neuro without gross focal deficit.  Sensation intact distally Skin: No lesions in the area of chief complaint MSK/Surgical Site: left knee w/o redness or effusion. ROM 0-100.  5/5 strength in extension and flexion.  +EHL/FHL.  NVI.  Stable varus and valgus stress.   Imaging Review Plain radiographs demonstrate severe degenerative joint disease of the left knee. Severe anteromedial osteoarthritis with some posterior tibial wear.   Preoperative templating of the joint replacement has been completed, documented, and submitted to the Operating Room personnel in order to optimize intra-operative equipment management.  Assessment: djd left knee Principal Problem:   Osteoarthritis of left knee   Plan: Plan for Procedure(s): TOTAL KNEE ARTHROPLASTY  The patient history, physical exam, clinical judgement of the provider and imaging are consistent with end stage degenerative joint disease and total joint arthroplasty is deemed medically necessary. The treatment options including medical management, injection therapy, and arthroplasty were discussed at length. The risks and benefits of Procedure(s): TOTAL KNEE ARTHROPLASTY  were presented and reviewed.  The risks of nonoperative treatment, versus surgical intervention including but not limited to continued pain, aseptic loosening, stiffness, dislocation/subluxation, infection, bleeding, nerve injury, blood clots, cardiopulmonary complications, morbidity, mortality, among others were discussed. The patient verbalizes understanding and wishes to proceed with the plan.  Patient is being admitted for inpatient treatment for surgery, pain control, PT, prophylactic antibiotics, VTE prophylaxis, progressive ambulation, ADL's and discharge planning.   Dental prophylaxis discussed and recommended for 2 years postoperatively.   The patient does meet the criteria for TXA which will be used perioperatively.    Home Eliquis  will be used postoperatively for DVT prophylaxis in addition to SCDs, and early ambulation  The patient is planning to be discharged home with OPPT in care of daughter   Patient's anticipated LOS is less than 2 midnights, meeting these requirements: - Younger than 34 - Lives within 1 hour of care - Has a competent adult at home to recover with post-op recover - NO history of  - Chronic pain requiring opiods  - Diabetes  - Coronary Artery Disease  - Heart failure  - Heart attack  - Stroke  - DVT/VTE  - Cardiac arrhythmia  - Respiratory Failure/COPD  - Renal failure  - Anemia  - Advanced Liver disease        Armida Sans, PA-C 11/29/2020 4:16 PM

## 2020-11-30 LAB — QUANTIFERON-TB GOLD PLUS
Mitogen-NIL: >10 IU/mL
NIL: 0.09 IU/mL
QuantiFERON-TB Gold Plus: NEGATIVE
TB1-NIL: <0 IU/mL
TB2-NIL: <0 IU/mL

## 2020-11-30 NOTE — Progress Notes (Signed)
TB gold is negative.

## 2020-12-01 NOTE — Progress Notes (Signed)
DUE TO COVID-19 ONLY ONE VISITOR IS ALLOWED TO COME WITH YOU AND STAY IN THE WAITING ROOM ONLY DURING PRE OP AND PROCEDURE DAY OF SURGERY. THE 1 VISITOR  MAY VISIT WITH YOU AFTER SURGERY IN YOUR PRIVATE ROOM DURING VISITING HOURS ONLY!  YOU NEED TO HAVE A COVID 19 TEST ON__3/18/2022 ____ @_______ , THIS TEST MUST BE DONE BEFORE SURGERY,  COVID TESTING SITE [address] [place] , IT IS ON THE RIGHT GOING OUT [address] APPROXIMATELY  2 MINUTES PAST [employer] ON THE RIGHT. ONCE YOUR COVID TEST IS COMPLETED,  PLEASE BEGIN THE QUARANTINE INSTRUCTIONS AS OUTLINED IN YOUR HANDOUT.                Dylan Dudley  12/01/2020   Your procedure is scheduled on: 12/13/2020    Report to [facility] Main  Entrance   Report to admitting at    1030 AM     Call this number if you have problems the morning of surgery [phone]    REMEMBER: NO  SOLID FOOD CANDY OR GUM AFTER MIDNIGHT. CLEAR LIQUIDS UNTIL 0945am         . NOTHING BY MOUTH EXCEPT CLEAR LIQUIDS UNTIL    . PLEASE FINISH ENSURE DRINK PER SURGEON ORDER  WHICH NEEDS TO BE COMPLETED AT      . 0945am      CLEAR LIQUID DIET   Foods Allowed                                                                    Coffee and tea, regular and decaf                            Fruit ices (not with fruit pulp)                                      Iced Popsicles                                    Carbonated beverages, regular and diet                                    Cranberry, grape and apple juices Sports drinks like Gatorade Lightly seasoned clear broth or consume(fat free) Sugar, honey syrup ___________________________________________________________________      BRUSH YOUR TEETH MORNING OF SURGERY AND RINSE YOUR MOUTH OUT, NO CHEWING GUM CANDY OR MINTS.     Take these medicines the morning of surgery with A SIP OF WATER: nebulizer if needed, Wellbutrin, Coreg, Claritin, flonase if needed,  protonix, Inhalers as usual and bring   DO NOT TAKE ANY DIABETIC MEDICATIONS DAY OF YOUR SURGERY                               You may not have any metal on your body including hair pins and  piercings  Do not wear jewelry, make-up, lotions, powders or perfumes, deodorant             Do not wear nail polish on your fingernails.  Do not shave  48 hours prior to surgery.              Men may shave face and neck.   Do not bring valuables to the hospital. [place].  Contacts, dentures or bridgework may not be worn into surgery.  Leave suitcase in the car. After surgery it may be brought to your room.     Patients discharged the day of surgery will not be allowed to drive home. IF YOU ARE HAVING SURGERY AND GOING HOME THE SAME DAY, YOU MUST HAVE AN ADULT TO DRIVE YOU HOME AND BE WITH YOU FOR 24 HOURS. YOU MAY GO HOME BY TAXI OR UBER OR ORTHERWISE, BUT AN ADULT MUST ACCOMPANY YOU HOME AND STAY WITH YOU FOR 24 HOURS.  Name and phone number of your driver:  Special Instructions: N/A              Please read over the following fact sheets you were given: _____________________________________________________________________  [facility] - Preparing for Surgery Before surgery, you can play an important role.  Because skin is not sterile, your skin needs to be as free of germs as possible.  You can reduce the number of germs on your skin by washing with CHG (chlorahexidine gluconate) soap before surgery.  CHG is an antiseptic cleaner which kills germs and bonds with the skin to continue killing germs even after washing. Please DO NOT use if you have an allergy to CHG or antibacterial soaps.  If your skin becomes reddened/irritated stop using the CHG and inform your nurse when you arrive at Short Stay. Do not shave (including legs and underarms) for at least 48 hours prior to the first CHG shower.  You may shave your face/neck. Please follow  these instructions carefully:  1.  Shower with CHG Soap the night before surgery and the  morning of Surgery.  2.  If you choose to wash your hair, wash your hair first as usual with your  normal  shampoo.  3.  After you shampoo, rinse your hair and body thoroughly to remove the  shampoo.                           4.  Use CHG as you would any other liquid soap.  You can apply chg directly  to the skin and wash                       Gently with a scrungie or clean washcloth.  5.  Apply the CHG Soap to your body ONLY FROM THE NECK DOWN.   Do not use on face/ open                           Wound or open sores. Avoid contact with eyes, ears mouth and genitals (private parts).                       Wash face,  Genitals (private parts) with your normal soap.             6.  Wash thoroughly, paying special attention to the area where your surgery  will be performed.  7.  Thoroughly rinse your body with warm water from the neck down.  8.  DO NOT shower/wash with your normal soap after using and rinsing off  the CHG Soap.                9.  Pat yourself dry with a clean towel.            10.  Wear clean pajamas.            11.  Place clean sheets on your bed the night of your first shower and do not  sleep with pets. Day of Surgery : Do not apply any lotions/deodorants the morning of surgery.  Please wear clean clothes to the hospital/surgery center.  FAILURE TO FOLLOW THESE INSTRUCTIONS MAY RESULT IN THE CANCELLATION OF YOUR SURGERY PATIENT SIGNATURE_________________________________  NURSE SIGNATURE__________________________________  ________________________________________________________________________

## 2020-12-02 ENCOUNTER — Encounter (HOSPITAL_COMMUNITY): Admission: RE | Admit: 2020-12-02 | Payer: Medicare Other | Source: Ambulatory Visit

## 2020-12-05 ENCOUNTER — Encounter (HOSPITAL_COMMUNITY): Admission: RE | Admit: 2020-12-05 | Payer: Medicare Other | Source: Ambulatory Visit

## 2020-12-06 ENCOUNTER — Encounter (HOSPITAL_COMMUNITY)
Admission: RE | Admit: 2020-12-06 | Discharge: 2020-12-06 | Disposition: A | Discharge status: Home / Self Care | Payer: Medicare Other | Source: Ambulatory Visit | Attending: Orthopedic Surgery | Admitting: Orthopedic Surgery

## 2020-12-06 ENCOUNTER — Other Ambulatory Visit: Payer: Self-pay

## 2020-12-06 ENCOUNTER — Encounter (HOSPITAL_COMMUNITY): Payer: Self-pay

## 2020-12-06 DIAGNOSIS — Z01812 Encounter for preprocedural laboratory examination: Secondary | ICD-10-CM | POA: Insufficient documentation

## 2020-12-06 HISTORY — DX: Unspecified chronic bronchitis: J42

## 2020-12-06 LAB — BASIC METABOLIC PANEL
Anion gap: 9 (ref 5–15)
BUN: 18 mg/dL (ref 6–20)
CO2: 29 mmol/L (ref 22–32)
Calcium: 9.6 mg/dL (ref 8.9–10.3)
Chloride: 101 mmol/L (ref 98–111)
Creatinine, Ser: 1.29 mg/dL — ABNORMAL HIGH (ref 0.61–1.24)
GFR, Estimated: >60 mL/min (ref >60)
Glucose, Bld: 124 mg/dL — ABNORMAL HIGH (ref 70–99)
Potassium: 3.6 mmol/L (ref 3.5–5.1)
Sodium: 139 mmol/L (ref 135–145)

## 2020-12-06 LAB — SURGICAL PCR SCREEN
MRSA, PCR: NEGATIVE
Staphylococcus aureus: NEGATIVE

## 2020-12-06 LAB — CBC
HCT: 41.9 % (ref 39.0–52.0)
Hemoglobin: 14.2 g/dL (ref 13.0–17.0)
MCH: 30.9 pg (ref 26.0–34.0)
MCHC: 33.9 g/dL (ref 30.0–36.0)
MCV: 91.1 fL (ref 80.0–100.0)
Platelets: 231 10*3/uL (ref 150–400)
RBC: 4.6 MIL/uL (ref 4.22–5.81)
RDW: 13.7 % (ref 11.5–15.5)
WBC: 4.3 10*3/uL (ref 4.0–10.5)
nRBC: 0 % (ref 0.0–0.2)

## 2020-12-06 NOTE — Progress Notes (Addendum)
Anesthesia Review:  PCP: DR Vernell Leep - [phone] Requested LOV note and ekg they are to fax Clearances being sent by sherry at [facility] office Clearance on chart dated 09/30/20 and LOV on chart dated 10/06/20 Cardiologist : DR Daryel November [facility] [phone] They are to send most recent ekg, stress echo and office visit note Cardiac clearance on chart dated 09/30/20 LOV note Cardiology- 09/27/20 on chart  Chest x-ray : EKG :09/2019 on chart , 07/05/20 on chart  Echo :10/03/2020 on chart  Stress test:05/09/2017- on chart  Cardiac Cath :  Activity level: can do a flight of stairs without difficulty  Sleep Study/ CPAP : cpap  Fasting Blood Sugar :      / Checks Blood Sugar -- times a day:   Blood Thinner/ Instructions /Last Dose: ASA / Instructions/ Last Dose :  Eliquis- to stop 3 days prior per pt

## 2020-12-07 NOTE — Anesthesia Preprocedure Evaluation (Addendum)
Anesthesia Evaluation  Patient identified by MRN, date of birth, ID band Patient awake    Reviewed: Allergy & Precautions, NPO status , Patient's Chart, lab work & pertinent test results, reviewed documented beta blocker date and time   History of Anesthesia Complications Negative for: history of anesthetic complications  Airway Mallampati: II  TM Distance: >3 FB Neck ROM: Full    Dental no notable dental hx.    Pulmonary sleep apnea and Continuous Positive Airway Pressure Ventilation , former smoker,    Pulmonary exam normal        Cardiovascular hypertension, Pt. on medications and Pt. on home beta blockers + CAD and +CHF  Normal cardiovascular exam  Echo 10/03/2020: EF 40-45%, mild RVE, mild LVE   Neuro/Psych Anxiety Depression negative psych ROS   GI/Hepatic GERD  Medicated and Controlled,  Endo/Other    Renal/GU      Musculoskeletal  (+) Arthritis , Rheumatoid disorders,    Abdominal   Peds  Hematology   Anesthesia Other Findings   Reproductive/Obstetrics                           Anesthesia Physical Anesthesia Plan  ASA: III  Anesthesia Plan: Spinal   Post-op Pain Management:  Regional for Post-op pain   Induction:   PONV Risk Score and Plan: 2 and Treatment may vary due to age or medical condition, Ondansetron, Propofol infusion, Dexamethasone and Midazolam  Airway Management Planned: Natural Airway and Simple Face Mask  Additional Equipment: None  Intra-op Plan:   Post-operative Plan:   Informed Consent: I have reviewed the patients History and Physical, chart, labs and discussed the procedure including the risks, benefits and alternatives for the proposed anesthesia with the patient or authorized representative who has indicated his/her understanding and acceptance.       Plan Discussed with: CRNA  Anesthesia Plan Comments: (See PAT note 12/06/2020, Jodell Cipro, PA-C)       Anesthesia Quick Evaluation

## 2020-12-07 NOTE — Progress Notes (Signed)
Anesthesia Chart Review   Case: [id number] Date/Time: 12/13/20 1239   Procedure: TOTAL KNEE ARTHROPLASTY (Left Knee)   Anesthesia type: Choice   Pre-op diagnosis: djd left knee   Location: [facility] ROOM 07 / [facility]   Surgeons: Teryl Lucy, MD      DISCUSSION:60 y.o. former smoker with h/o HTN, GERD, sleep apnea with CPAP, non-ischemic cardiomyopathy, djd left knee scheduled for above procedure 12/13/20 with Dr. Teryl Lucy.   Pt last seen by cardiology 10/17/2020. Clearance received from cardiologist which state spt is optimzed from cardiac standpoint. "Avoid excessive IV fluids d/t EF 40-45%." Ok to hold Eliquis 3 days prior to procedure   Clearance from PCP on chart which states pt is optimized from a medical standpoint.  VS: BP 134/78   Pulse 65   Temp 36.7 C (Oral)   Resp 16   SpO2 100%   PROVIDERS: Elise Benne, MD is PCP   Daryel November, MD is Cardiologist in [place], [place] LABS: Labs reviewed: Acceptable for surgery. (all labs ordered are listed, but only abnormal results are displayed)  Labs Reviewed  BASIC METABOLIC PANEL - Abnormal; Notable for the following components:      Result Value   Glucose, Bld 124 (*)    Creatinine, Ser 1.29 (*)    All other components within normal limits  SURGICAL PCR SCREEN  CBC     IMAGES:   EKG:   CV: Echo 10/03/2020 Mild overall left ventricular systolic dysfunction. LVEF: 40-45%. Cardiac chamber imaging demonstrates mild right ventricular enlargement, and mild left ventricular enlargement No pericardial effusion  No other significant findings.  Past Medical History:  Diagnosis Date  . AC (acromioclavicular) arthritis 03/14/2012  . Anxiety   . Arthritis    "shoulders; knees" (07/17/2013)  . Chronic bronchitis (HCC)   . Congestive heart failure (CHF) (HCC)    per patient   . DDD (degenerative disc disease)   . Depression   . Disorder of tendon of biceps 03/14/2012  . GERD (gastroesophageal reflux disease)   .  Hyperlipemia   . Hypertension   . Impingement syndrome of right shoulder 03/14/2012  . Pneumonia 2012   "once" (07/17/2013)  . Rheumatoid arthritis (HCC)   . Seasonal allergies   . Sleep apnea    CPAP   . Tear of right rotator cuff 03/14/2012    Past Surgical History:  Procedure Laterality Date  . ANTERIOR CERVICAL DECOMP/DISCECTOMY FUSION  08/2011   [place]  . BUNIONECTOMY Bilateral 1998  . DEBRIDEMENT AND CLOSURE WOUND  12/2012   "back of my neck" (07/17/2013)  . DEBRIDEMENT AND CLOSURE WOUND N/A 07/17/2013   Procedure: DEBRIDEMENT  OF SKIN, SUBCUTANEOUS TISSUE BONE, MUSCLE FLAP, AND CLOSURE WOUND;  Surgeon: Glenna Fellows, MD;  Location: [facility] OR;  Service: Plastics;  Laterality: N/A;  . EXCISIONAL HEMORRHOIDECTOMY  ~ 1996  . INCISION AND DRAINAGE OF WOUND  10/2012 X 2   "back of my neck" (07/17/2013)  . MUSCLE FLAP CLOSURE  07/17/2013   "to back of my neck" (07/17/2013)  . NECK SURGERY  09/2012   "spur removed from back of my neck" (07/17/2013)  . SHOULDER ARTHROSCOPY Left 2008  . SHOULDER ARTHROSCOPY W/ ROTATOR CUFF REPAIR Right 2013   "and bicep repair" (07/17/2013)  . TONSILLECTOMY  ~ 1974    MEDICATIONS: . albuterol (ACCUNEB) 0.63 MG/3ML nebulizer solution  . aspirin 81 MG tablet  . benzonatate (TESSALON) 200 MG capsule  . buPROPion (WELLBUTRIN SR) 150 MG 12 hr tablet  .  carvedilol (COREG) 6.25 MG tablet  . chlorthalidone (HYGROTON) 25 MG tablet  . cholecalciferol (VITAMIN D) 25 MCG (1000 UNIT) tablet  . ELIQUIS 2.5 MG TABS tablet  . fluticasone (FLONASE) 50 MCG/ACT nasal spray  . loratadine (CLARITIN) 10 MG tablet  . [place]ENC[place] CLICKJECT 125 MG/ML SOAJ  . pantoprazole (PROTONIX) 20 MG tablet  . rosuvastatin (CRESTOR) 20 MG tablet  . sacubitril-valsartan (ENTRESTO) 97-103 MG  . sildenafil (VIAGRA) 100 MG tablet  . SYMBICORT 160-4.5 MCG/ACT inhaler  . traZODone (DESYREL) 100 MG tablet   No current facility-administered medications for this encounter.     Jodell Cipro, PA-C [facility] Pre-Surgical Testing [phone]

## 2020-12-09 ENCOUNTER — Other Ambulatory Visit (HOSPITAL_COMMUNITY)
Admission: RE | Admit: 2020-12-09 | Discharge: 2020-12-09 | Disposition: A | Discharge status: Home / Self Care | Payer: Medicare Other | Source: Ambulatory Visit | Attending: Orthopedic Surgery | Admitting: Orthopedic Surgery

## 2020-12-09 ENCOUNTER — Other Ambulatory Visit: Payer: Self-pay | Admitting: Physician Assistant

## 2020-12-09 DIAGNOSIS — Z20822 Contact with and (suspected) exposure to covid-19: Secondary | ICD-10-CM | POA: Insufficient documentation

## 2020-12-09 DIAGNOSIS — Z01812 Encounter for preprocedural laboratory examination: Secondary | ICD-10-CM | POA: Insufficient documentation

## 2020-12-09 LAB — SARS CORONAVIRUS 2 (TAT 6-24 HRS): SARS Coronavirus 2: NEGATIVE

## 2020-12-09 NOTE — Telephone Encounter (Signed)
Next Visit: 05/01/2021  Last Visit: 11/28/2020  Last Fill: 11/09/2020  FK:[id number] arthritis with rheumatoid factor of multiple sites without organ or systems involvement   Current Dose per office note 11/28/2020, Orencia 125 mg sq injections once weekly monotherapy  Labs: 12/06/2020 Glucose 124, Creatinine 1.29,   TB Gold: 11/28/2020, negative  Okay to refill Orencia?

## 2020-12-12 ENCOUNTER — Other Ambulatory Visit: Payer: Self-pay | Admitting: Physician Assistant

## 2020-12-12 NOTE — Progress Notes (Signed)
Pt aware to arrive at [facility] admitting at 0730 on Tuesday 12/12/2020 for scheduled surgical procedure. No food after midnight;clear liquids from midnight till 0700 consuming entire pre surgery drink by 0700 then nothing by mouth.

## 2020-12-13 ENCOUNTER — Encounter (HOSPITAL_COMMUNITY)
Admission: RE | Disposition: A | Discharge status: Home / Self Care | Payer: Self-pay | Source: Other Acute Inpatient Hospital | Attending: Orthopedic Surgery

## 2020-12-13 ENCOUNTER — Ambulatory Visit (HOSPITAL_COMMUNITY): Payer: Medicare Other | Admitting: Physician Assistant

## 2020-12-13 ENCOUNTER — Ambulatory Visit (HOSPITAL_COMMUNITY): Payer: Medicare Other | Admitting: Anesthesiology

## 2020-12-13 ENCOUNTER — Other Ambulatory Visit: Payer: Self-pay

## 2020-12-13 ENCOUNTER — Observation Stay (HOSPITAL_COMMUNITY)
Admission: RE | Admit: 2020-12-13 | Discharge: 2020-12-14 | Disposition: A | Discharge status: Home / Self Care | Payer: Medicare Other | Source: Other Acute Inpatient Hospital | Attending: Orthopedic Surgery | Admitting: Orthopedic Surgery

## 2020-12-13 ENCOUNTER — Encounter (HOSPITAL_COMMUNITY): Payer: Self-pay | Admitting: Orthopedic Surgery

## 2020-12-13 ENCOUNTER — Observation Stay (HOSPITAL_COMMUNITY): Payer: Medicare Other

## 2020-12-13 DIAGNOSIS — Z7982 Long term (current) use of aspirin: Secondary | ICD-10-CM | POA: Diagnosis not present

## 2020-12-13 DIAGNOSIS — Z91040 Latex allergy status: Secondary | ICD-10-CM | POA: Diagnosis not present

## 2020-12-13 DIAGNOSIS — Z79899 Other long term (current) drug therapy: Secondary | ICD-10-CM | POA: Diagnosis not present

## 2020-12-13 DIAGNOSIS — I110 Hypertensive heart disease with heart failure: Secondary | ICD-10-CM | POA: Insufficient documentation

## 2020-12-13 DIAGNOSIS — M1712 Unilateral primary osteoarthritis, left knee: Principal | ICD-10-CM | POA: Diagnosis present

## 2020-12-13 DIAGNOSIS — Z96652 Presence of left artificial knee joint: Secondary | ICD-10-CM

## 2020-12-13 DIAGNOSIS — I509 Heart failure, unspecified: Secondary | ICD-10-CM | POA: Insufficient documentation

## 2020-12-13 DIAGNOSIS — Z87891 Personal history of nicotine dependence: Secondary | ICD-10-CM | POA: Diagnosis not present

## 2020-12-13 DIAGNOSIS — Z7901 Long term (current) use of anticoagulants: Secondary | ICD-10-CM | POA: Diagnosis not present

## 2020-12-13 HISTORY — PX: TOTAL KNEE ARTHROPLASTY: SHX125

## 2020-12-13 SURGERY — ARTHROPLASTY, KNEE, TOTAL
Anesthesia: Spinal | Site: Knee | Laterality: Left

## 2020-12-13 MED ORDER — PHENOL 1.4 % MT LIQD: 1.0000 | OROMUCOSAL | Status: DC | PRN

## 2020-12-13 MED ORDER — POLYETHYLENE GLYCOL 3350 17 G PO PACK: 17.0000 g | PACK | Freq: Every day | ORAL | Status: DC | PRN

## 2020-12-13 MED ORDER — BUPIVACAINE HCL (PF) 0.25 % IJ SOLN
INTRAMUSCULAR | Status: AC
  Filled 2020-12-13: qty 30

## 2020-12-13 MED ORDER — CLONIDINE HCL (ANALGESIA) 100 MCG/ML EP SOLN
EPIDURAL | Status: DC | PRN
  Administered 2020-12-13: 100 ug

## 2020-12-13 MED ORDER — ONDANSETRON HCL 4 MG/2ML IJ SOLN
INTRAMUSCULAR | Status: DC | PRN
  Administered 2020-12-13: 4 mg via INTRAVENOUS

## 2020-12-13 MED ORDER — METOCLOPRAMIDE HCL 5 MG/ML IJ SOLN: 5.0000 mg | Freq: Three times a day (TID) | INTRAMUSCULAR | Status: DC | PRN

## 2020-12-13 MED ORDER — METHOCARBAMOL 500 MG PO TABS
500.0000 mg | ORAL_TABLET | Freq: Four times a day (QID) | ORAL | Status: DC | PRN
  Administered 2020-12-13 – 2020-12-14 (×4): 500 mg via ORAL
  Filled 2020-12-13 (×4): qty 1

## 2020-12-13 MED ORDER — PROPOFOL 10 MG/ML IV BOLUS
INTRAVENOUS | Status: AC
  Filled 2020-12-13: qty 20

## 2020-12-13 MED ORDER — BUPIVACAINE HCL 0.25 % IJ SOLN
INTRAMUSCULAR | Status: DC | PRN
  Administered 2020-12-13: 30 mL

## 2020-12-13 MED ORDER — BUPIVACAINE IN DEXTROSE 0.75-8.25 % IT SOLN
INTRATHECAL | Status: DC | PRN
  Administered 2020-12-13: 1.7 mL via INTRATHECAL

## 2020-12-13 MED ORDER — 0.9 % SODIUM CHLORIDE (POUR BTL) OPTIME
TOPICAL | Status: DC | PRN
  Administered 2020-12-13: 1000 mL

## 2020-12-13 MED ORDER — ALUM & MAG HYDROXIDE-SIMETH 200-200-20 MG/5ML PO SUSP: 30.0000 mL | ORAL | Status: DC | PRN

## 2020-12-13 MED ORDER — PROPOFOL 500 MG/50ML IV EMUL
INTRAVENOUS | Status: AC
  Filled 2020-12-13: qty 100

## 2020-12-13 MED ORDER — VITAMIN D 25 MCG (1000 UNIT) PO TABS
1000.0000 [IU] | ORAL_TABLET | Freq: Every day | ORAL | Status: DC
  Administered 2020-12-14: 1000 [IU] via ORAL
  Filled 2020-12-13: qty 1

## 2020-12-13 MED ORDER — DIPHENHYDRAMINE HCL 12.5 MG/5ML PO ELIX: 12.5000 mg | ORAL_SOLUTION | ORAL | Status: DC | PRN

## 2020-12-13 MED ORDER — KETOROLAC TROMETHAMINE 30 MG/ML IJ SOLN
INTRAMUSCULAR | Status: AC
  Filled 2020-12-13: qty 1

## 2020-12-13 MED ORDER — CHLORHEXIDINE GLUCONATE 0.12 % MT SOLN
15.0000 mL | Freq: Once | OROMUCOSAL | Status: AC
  Administered 2020-12-13: 15 mL via OROMUCOSAL

## 2020-12-13 MED ORDER — ACETAMINOPHEN 500 MG PO TABS
1000.0000 mg | ORAL_TABLET | Freq: Four times a day (QID) | ORAL | Status: AC
  Administered 2020-12-13 – 2020-12-14 (×4): 1000 mg via ORAL
  Filled 2020-12-13 (×4): qty 2

## 2020-12-13 MED ORDER — BISACODYL 10 MG RE SUPP: 10.0000 mg | Freq: Every day | RECTAL | Status: DC | PRN

## 2020-12-13 MED ORDER — OXYCODONE HCL 5 MG PO TABS
5.0000 mg | ORAL_TABLET | ORAL | Status: DC | PRN
  Administered 2020-12-13: 10 mg via ORAL
  Filled 2020-12-13 (×2): qty 2

## 2020-12-13 MED ORDER — METHOCARBAMOL 500 MG IVPB - SIMPLE MED
500.0000 mg | Freq: Four times a day (QID) | INTRAVENOUS | Status: DC | PRN
  Filled 2020-12-13: qty 50

## 2020-12-13 MED ORDER — ALBUTEROL SULFATE (2.5 MG/3ML) 0.083% IN NEBU: 2.5000 mg | INHALATION_SOLUTION | Freq: Four times a day (QID) | RESPIRATORY_TRACT | Status: DC | PRN

## 2020-12-13 MED ORDER — POVIDONE-IODINE 10 % EX SWAB
2.0000 "application " | Freq: Once | CUTANEOUS | Status: AC
  Administered 2020-12-13: 2 via TOPICAL

## 2020-12-13 MED ORDER — DEXAMETHASONE SODIUM PHOSPHATE 10 MG/ML IJ SOLN
10.0000 mg | Freq: Once | INTRAMUSCULAR | Status: AC
  Administered 2020-12-14: 10 mg via INTRAVENOUS
  Filled 2020-12-13: qty 1

## 2020-12-13 MED ORDER — ROSUVASTATIN CALCIUM 20 MG PO TABS
20.0000 mg | ORAL_TABLET | Freq: Every day | ORAL | Status: DC
  Administered 2020-12-13: 20 mg via ORAL
  Filled 2020-12-13: qty 1

## 2020-12-13 MED ORDER — PHENYLEPHRINE 40 MCG/ML (10ML) SYRINGE FOR IV PUSH (FOR BLOOD PRESSURE SUPPORT)
PREFILLED_SYRINGE | INTRAVENOUS | Status: DC | PRN
  Administered 2020-12-13: 40 ug via INTRAVENOUS

## 2020-12-13 MED ORDER — ASPIRIN EC 81 MG PO TBEC
81.0000 mg | DELAYED_RELEASE_TABLET | Freq: Every day | ORAL | Status: DC
  Administered 2020-12-13 – 2020-12-14 (×2): 81 mg via ORAL
  Filled 2020-12-13 (×2): qty 1

## 2020-12-13 MED ORDER — BUPROPION HCL ER (SR) 150 MG PO TB12
150.0000 mg | ORAL_TABLET | Freq: Every day | ORAL | Status: DC
  Administered 2020-12-14: 150 mg via ORAL
  Filled 2020-12-13: qty 1

## 2020-12-13 MED ORDER — PHENYLEPHRINE 40 MCG/ML (10ML) SYRINGE FOR IV PUSH (FOR BLOOD PRESSURE SUPPORT)
PREFILLED_SYRINGE | INTRAVENOUS | Status: AC
  Filled 2020-12-13: qty 10

## 2020-12-13 MED ORDER — ORAL CARE MOUTH RINSE: 15.0000 mL | Freq: Once | OROMUCOSAL | Status: AC

## 2020-12-13 MED ORDER — LORATADINE 10 MG PO TABS
10.0000 mg | ORAL_TABLET | Freq: Every day | ORAL | Status: DC
  Administered 2020-12-14: 10 mg via ORAL
  Filled 2020-12-13: qty 1

## 2020-12-13 MED ORDER — CHLORTHALIDONE 25 MG PO TABS
12.5000 mg | ORAL_TABLET | Freq: Two times a day (BID) | ORAL | Status: DC
  Administered 2020-12-13 – 2020-12-14 (×2): 12.5 mg via ORAL
  Filled 2020-12-13 (×2): qty 1

## 2020-12-13 MED ORDER — SACUBITRIL-VALSARTAN 97-103 MG PO TABS
1.0000 | ORAL_TABLET | Freq: Two times a day (BID) | ORAL | Status: DC
  Administered 2020-12-13 – 2020-12-14 (×2): 1 via ORAL
  Filled 2020-12-13 (×2): qty 1

## 2020-12-13 MED ORDER — PROPOFOL 1000 MG/100ML IV EMUL
INTRAVENOUS | Status: AC
  Filled 2020-12-13: qty 100

## 2020-12-13 MED ORDER — WATER FOR IRRIGATION, STERILE IR SOLN
Status: DC | PRN
  Administered 2020-12-13: 2000 mL

## 2020-12-13 MED ORDER — SODIUM CHLORIDE 0.9 % IR SOLN
Status: DC | PRN
  Administered 2020-12-13: 3000 mL

## 2020-12-13 MED ORDER — PROPOFOL 500 MG/50ML IV EMUL
INTRAVENOUS | Status: AC
  Filled 2020-12-13: qty 50

## 2020-12-13 MED ORDER — POVIDONE-IODINE 10 % EX SWAB: 2.0000 "application " | Freq: Once | CUTANEOUS | Status: DC

## 2020-12-13 MED ORDER — MENTHOL 3 MG MT LOZG: 1.0000 | LOZENGE | OROMUCOSAL | Status: DC | PRN

## 2020-12-13 MED ORDER — CEFAZOLIN SODIUM-DEXTROSE 2-4 GM/100ML-% IV SOLN
2.0000 g | INTRAVENOUS | Status: AC
  Administered 2020-12-13: 2 g via INTRAVENOUS
  Filled 2020-12-13: qty 100

## 2020-12-13 MED ORDER — PROMETHAZINE HCL 25 MG/ML IJ SOLN: 6.2500 mg | INTRAMUSCULAR | Status: DC | PRN

## 2020-12-13 MED ORDER — HYDROMORPHONE HCL 1 MG/ML IJ SOLN
0.5000 mg | INTRAMUSCULAR | Status: DC | PRN
  Administered 2020-12-13: 0.5 mg via INTRAVENOUS
  Filled 2020-12-13: qty 1

## 2020-12-13 MED ORDER — PANTOPRAZOLE SODIUM 20 MG PO TBEC
20.0000 mg | DELAYED_RELEASE_TABLET | Freq: Every day | ORAL | Status: DC
  Administered 2020-12-14: 20 mg via ORAL
  Filled 2020-12-13: qty 1

## 2020-12-13 MED ORDER — PROPOFOL 10 MG/ML IV BOLUS
INTRAVENOUS | Status: DC | PRN
  Administered 2020-12-13: 20 mg via INTRAVENOUS
  Administered 2020-12-13: 30 mg via INTRAVENOUS

## 2020-12-13 MED ORDER — POTASSIUM CHLORIDE IN NACL 20-0.45 MEQ/L-% IV SOLN
INTRAVENOUS | Status: DC
  Filled 2020-12-13: qty 1000

## 2020-12-13 MED ORDER — KETOROLAC TROMETHAMINE 30 MG/ML IJ SOLN
INTRAMUSCULAR | Status: DC | PRN
  Administered 2020-12-13: 30 mg

## 2020-12-13 MED ORDER — DOCUSATE SODIUM 100 MG PO CAPS
100.0000 mg | ORAL_CAPSULE | Freq: Two times a day (BID) | ORAL | Status: DC
  Administered 2020-12-13 – 2020-12-14 (×2): 100 mg via ORAL
  Filled 2020-12-13 (×2): qty 1

## 2020-12-13 MED ORDER — ONDANSETRON HCL 4 MG/2ML IJ SOLN: 4.0000 mg | Freq: Four times a day (QID) | INTRAMUSCULAR | Status: DC | PRN

## 2020-12-13 MED ORDER — TRANEXAMIC ACID-NACL 1000-0.7 MG/100ML-% IV SOLN
1000.0000 mg | INTRAVENOUS | Status: AC
  Administered 2020-12-13: 1000 mg via INTRAVENOUS
  Filled 2020-12-13: qty 100

## 2020-12-13 MED ORDER — FENTANYL CITRATE (PF) 100 MCG/2ML IJ SOLN
50.0000 ug | INTRAMUSCULAR | Status: DC
  Administered 2020-12-13: 50 ug via INTRAVENOUS
  Filled 2020-12-13: qty 2

## 2020-12-13 MED ORDER — CEFAZOLIN SODIUM-DEXTROSE 2-4 GM/100ML-% IV SOLN
2.0000 g | Freq: Four times a day (QID) | INTRAVENOUS | Status: AC
  Administered 2020-12-13 (×2): 2 g via INTRAVENOUS
  Filled 2020-12-13 (×2): qty 100

## 2020-12-13 MED ORDER — OXYCODONE HCL 5 MG/5ML PO SOLN: 5.0000 mg | Freq: Once | ORAL | Status: DC | PRN

## 2020-12-13 MED ORDER — LACTATED RINGERS IV SOLN: INTRAVENOUS | Status: DC

## 2020-12-13 MED ORDER — OXYCODONE HCL 5 MG PO TABS: 5.0000 mg | ORAL_TABLET | Freq: Once | ORAL | Status: DC | PRN

## 2020-12-13 MED ORDER — PROPOFOL 500 MG/50ML IV EMUL
INTRAVENOUS | Status: DC | PRN
  Administered 2020-12-13: 100 ug/kg/min via INTRAVENOUS

## 2020-12-13 MED ORDER — METOCLOPRAMIDE HCL 5 MG PO TABS: 5.0000 mg | ORAL_TABLET | Freq: Three times a day (TID) | ORAL | Status: DC | PRN

## 2020-12-13 MED ORDER — ACETAMINOPHEN 500 MG PO TABS
1000.0000 mg | ORAL_TABLET | Freq: Once | ORAL | Status: AC
  Administered 2020-12-13: 1000 mg via ORAL
  Filled 2020-12-13: qty 2

## 2020-12-13 MED ORDER — PHENYLEPHRINE HCL-NACL 10-0.9 MG/250ML-% IV SOLN
INTRAVENOUS | Status: DC | PRN
  Administered 2020-12-13: 25 ug/min via INTRAVENOUS

## 2020-12-13 MED ORDER — BUPIVACAINE-EPINEPHRINE (PF) 0.5% -1:200000 IJ SOLN
INTRAMUSCULAR | Status: DC | PRN
  Administered 2020-12-13: 15 mL via PERINEURAL

## 2020-12-13 MED ORDER — MIDAZOLAM HCL 2 MG/2ML IJ SOLN
1.0000 mg | INTRAMUSCULAR | Status: DC
  Administered 2020-12-13: 1 mg via INTRAVENOUS
  Filled 2020-12-13: qty 2

## 2020-12-13 MED ORDER — MOMETASONE FURO-FORMOTEROL FUM 200-5 MCG/ACT IN AERO
2.0000 | INHALATION_SPRAY | Freq: Two times a day (BID) | RESPIRATORY_TRACT | Status: DC
  Administered 2020-12-13 – 2020-12-14 (×2): 2 via RESPIRATORY_TRACT
  Filled 2020-12-13: qty 8.8

## 2020-12-13 MED ORDER — ACETAMINOPHEN 325 MG PO TABS: 325.0000 mg | ORAL_TABLET | Freq: Four times a day (QID) | ORAL | Status: DC | PRN

## 2020-12-13 MED ORDER — CARVEDILOL 6.25 MG PO TABS
6.2500 mg | ORAL_TABLET | Freq: Two times a day (BID) | ORAL | Status: DC
  Administered 2020-12-13 – 2020-12-14 (×2): 6.25 mg via ORAL
  Filled 2020-12-13 (×2): qty 1

## 2020-12-13 MED ORDER — ONDANSETRON HCL 4 MG PO TABS: 4.0000 mg | ORAL_TABLET | Freq: Four times a day (QID) | ORAL | Status: DC | PRN

## 2020-12-13 MED ORDER — ONDANSETRON HCL 4 MG/2ML IJ SOLN
INTRAMUSCULAR | Status: AC
  Filled 2020-12-13: qty 2

## 2020-12-13 MED ORDER — OXYCODONE HCL 5 MG PO TABS
10.0000 mg | ORAL_TABLET | ORAL | Status: DC | PRN
  Administered 2020-12-13 – 2020-12-14 (×4): 15 mg via ORAL
  Filled 2020-12-13 (×5): qty 3

## 2020-12-13 MED ORDER — FENTANYL CITRATE (PF) 100 MCG/2ML IJ SOLN: 25.0000 ug | INTRAMUSCULAR | Status: DC | PRN

## 2020-12-13 MED ORDER — MAGNESIUM CITRATE PO SOLN: 1.0000 | Freq: Once | ORAL | Status: DC | PRN

## 2020-12-13 MED ORDER — APIXABAN 2.5 MG PO TABS
2.5000 mg | ORAL_TABLET | Freq: Two times a day (BID) | ORAL | Status: DC
  Administered 2020-12-14: 2.5 mg via ORAL
  Filled 2020-12-13: qty 1

## 2020-12-13 SURGICAL SUPPLY — 57 items
ATTUNE MED DOME PAT 41 KNEE (Knees) ×2 IMPLANT
ATTUNE PS FEM LT SZ 8 CEM KNEE (Femur) ×2 IMPLANT
BAG ZIPLOCK 12X15 (MISCELLANEOUS) IMPLANT
BASEPLATE TIB CMT FB PCKT SZ 8 (Knees) ×2 IMPLANT
BLADE SAG 18X100X1.27 (BLADE) ×2 IMPLANT
BLADE SAW SGTL 13X75X1.27 (BLADE) ×2 IMPLANT
BLADE SURG 15 STRL LF DISP TIS (BLADE) ×1 IMPLANT
BLADE SURG 15 STRL SS (BLADE) ×1
BNDG ELASTIC 6X10 VLCR STRL LF (GAUZE/BANDAGES/DRESSINGS) ×2 IMPLANT
BNDG ELASTIC 6X15 VLCR STRL LF (GAUZE/BANDAGES/DRESSINGS) ×2 IMPLANT
BOWL SMART MIX CTS (DISPOSABLE) ×2 IMPLANT
CEMENT HV SMART SET (Cement) ×4 IMPLANT
CLSR STERI-STRIP ANTIMIC 1/2X4 (GAUZE/BANDAGES/DRESSINGS) ×2 IMPLANT
COVER SURGICAL LIGHT HANDLE (MISCELLANEOUS) ×2 IMPLANT
COVER WAND RF STERILE (DRAPES) IMPLANT
CUFF TOURN SGL QUICK 34 (TOURNIQUET CUFF) ×1
CUFF TRNQT CYL 34X4.125X (TOURNIQUET CUFF) ×1 IMPLANT
DECANTER SPIKE VIAL GLASS SM (MISCELLANEOUS) IMPLANT
DRAPE U-SHAPE 47X51 STRL (DRAPES) ×2 IMPLANT
DRSG MEPILEX BORDER 4X12 (GAUZE/BANDAGES/DRESSINGS) ×2 IMPLANT
DRSG MEPILEX BORDER 4X8 (GAUZE/BANDAGES/DRESSINGS) ×2 IMPLANT
DRSG PAD ABDOMINAL 8X10 ST (GAUZE/BANDAGES/DRESSINGS) ×2 IMPLANT
DURAPREP 26ML APPLICATOR (WOUND CARE) ×4 IMPLANT
ELECT REM PT RETURN 15FT ADLT (MISCELLANEOUS) ×2 IMPLANT
GLOVE SRG 8 PF TXTR STRL LF DI (GLOVE) ×1 IMPLANT
GLOVE SURG ENC MOIS LTX SZ6.5 (GLOVE) ×2 IMPLANT
GLOVE SURG ENC MOIS LTX SZ7.5 (GLOVE) ×2 IMPLANT
GLOVE SURG UNDER POLY LF SZ7 (GLOVE) ×2 IMPLANT
GLOVE SURG UNDER POLY LF SZ8 (GLOVE) ×1
GOWN STRL REUS W/ TWL LRG LVL3 (GOWN DISPOSABLE) ×2 IMPLANT
GOWN STRL REUS W/TWL LRG LVL3 (GOWN DISPOSABLE) ×2
HANDPIECE INTERPULSE COAX TIP (DISPOSABLE) ×1
HOLDER FOLEY CATH W/STRAP (MISCELLANEOUS) IMPLANT
HOOD PEEL AWAY FLYTE STAYCOOL (MISCELLANEOUS) ×6 IMPLANT
IMMOBILIZER KNEE 20 (SOFTGOODS) ×2
IMMOBILIZER KNEE 20 THIGH 36 (SOFTGOODS) ×1 IMPLANT
INSERT TIBIA FIXED BEARING SZ8 (Insert) ×2 IMPLANT
KIT TURNOVER KIT A (KITS) ×2 IMPLANT
MANIFOLD NEPTUNE II (INSTRUMENTS) ×2 IMPLANT
NS IRRIG 1000ML POUR BTL (IV SOLUTION) ×2 IMPLANT
PACK ICE MAXI GEL EZY WRAP (MISCELLANEOUS) ×2 IMPLANT
PACK TOTAL KNEE CUSTOM (KITS) ×2 IMPLANT
PENCIL SMOKE EVACUATOR (MISCELLANEOUS) ×2 IMPLANT
PIN DRILL FIX HALF THREAD (BIT) ×4 IMPLANT
PIN STEINMAN FIXATION KNEE (PIN) ×4 IMPLANT
PROTECTOR NERVE ULNAR (MISCELLANEOUS) ×2 IMPLANT
SET HNDPC FAN SPRY TIP SCT (DISPOSABLE) ×1 IMPLANT
SET PAD KNEE POSITIONER (MISCELLANEOUS) ×2 IMPLANT
SPONGE LAP 18X18 RF (DISPOSABLE) ×2 IMPLANT
SUT VIC AB 1 CT1 36 (SUTURE) ×6 IMPLANT
SUT VIC AB 2-0 CT1 27 (SUTURE) ×2
SUT VIC AB 2-0 CT1 TAPERPNT 27 (SUTURE) ×2 IMPLANT
SUT VIC AB 3-0 SH 8-18 (SUTURE) ×2 IMPLANT
TRAY FOLEY MTR SLVR 16FR STAT (SET/KITS/TRAYS/PACK) ×2 IMPLANT
TUBE SUCTION HIGH CAP CLEAR NV (SUCTIONS) ×2 IMPLANT
WATER STERILE IRR 1000ML POUR (IV SOLUTION) ×4 IMPLANT
WRAP KNEE MAXI GEL POST OP (GAUZE/BANDAGES/DRESSINGS) ×2 IMPLANT

## 2020-12-13 NOTE — Transfer of Care (Signed)
Immediate Anesthesia Transfer of Care Note  Patient: Dylan Dudley  Procedure(s) Performed: TOTAL KNEE ARTHROPLASTY (Left Knee)  Patient Location: PACU  Anesthesia Type:Spinal  Level of Consciousness: awake, alert  and oriented  Airway & Oxygen Therapy: Patient Spontanous Breathing and Patient connected to face mask oxygen  Post-op Assessment: Report given to RN and Post -op Vital signs reviewed and stable  Post vital signs: Reviewed and stable  Last Vitals:  Vitals Value Taken Time  BP 110/76 12/13/20 1346  Temp    Pulse 80 12/13/20 1350  Resp 17 12/13/20 1350  SpO2 100 % 12/13/20 1350  Vitals shown include unvalidated device data.  Last Pain:  Vitals:   12/13/20 0930  TempSrc:   PainSc: 0-No pain      Patients Stated Pain Goal: 5 (12/13/20 0753)  Complications: No complications documented.

## 2020-12-13 NOTE — Progress Notes (Signed)
Pt refuses CPAP for the night. Pt aware that he can ask for CPAP at anytime.

## 2020-12-13 NOTE — Anesthesia Procedure Notes (Signed)
Spinal  Patient location during procedure: OR Start time: 12/13/2020 10:28 AM End time: 12/13/2020 10:33 AM Reason for block: surgical anesthesia Staffing Performed: anesthesiologist  Anesthesiologist: Kaylyn Layer, MD Preanesthetic Checklist Completed: patient identified, IV checked, risks and benefits discussed, surgical consent, monitors and equipment checked, pre-op evaluation and timeout performed Spinal Block Patient position: sitting Prep: DuraPrep and site prepped and draped Patient monitoring: continuous pulse ox, blood pressure and heart rate Approach: midline Location: L2-3 Injection technique: single-shot Needle Needle type: Pencan  Needle gauge: 24 G Needle length: 9 cm Assessment Events: CSF return and second provider Additional Notes Risks, benefits, and alternative discussed. Patient gave consent to procedure. Prepped and draped in sitting position. Patient sedated but responsive to voice. First attempt at L3-4 by CRNA unsuccessful. Clear CSF obtained at L2-3 after one needle redirection. Positive terminal aspiration. No pain or paraesthesias with injection. Patient tolerated procedure well. Vital signs stable. Amalia Greenhouse, MD

## 2020-12-13 NOTE — Progress Notes (Signed)
Assisted Dr. Howse with left, ultrasound guided, adductor canal block. Side rails up, monitors on throughout procedure. See vital signs in flow sheet. Tolerated Procedure well.  

## 2020-12-13 NOTE — Anesthesia Procedure Notes (Signed)
Procedure Name: MAC Date/Time: 12/13/2020 10:23 AM Performed by: Maxwell Caul, CRNA Pre-anesthesia Checklist: Patient identified, Emergency Drugs available, Suction available and Patient being monitored Oxygen Delivery Method: Simple face mask

## 2020-12-13 NOTE — Plan of Care (Signed)
  Problem: Education: Goal: Knowledge of General Education information will improve Description: Including pain rating scale, medication(s)/side effects and non-pharmacologic comfort measures Outcome: Progressing   Problem: Activity: Goal: Risk for activity intolerance will decrease Outcome: Progressing   Problem: Nutrition: Goal: Adequate nutrition will be maintained Outcome: Progressing   Problem: Elimination: Goal: Will not experience complications related to bowel motility Outcome: Progressing   Problem: Pain Managment: Goal: General experience of comfort will improve Outcome: Progressing   Problem: Safety: Goal: Ability to remain free from injury will improve Outcome: Progressing   Problem: Education: Goal: Knowledge of the prescribed therapeutic regimen will improve Outcome: Progressing   Problem: Activity: Goal: Ability to avoid complications of mobility impairment will improve Outcome: Progressing   Problem: Clinical Measurements: Goal: Postoperative complications will be avoided or minimized Outcome: Progressing   Problem: Pain Management: Goal: Pain level will decrease with appropriate interventions Outcome: Progressing

## 2020-12-13 NOTE — Interval H&P Note (Signed)
History and Physical Interval Note:  12/13/2020 9:17 AM  Dylan Dudley  has presented today for surgery, with the diagnosis of djd left knee.  The various methods of treatment have been discussed with the patient and family. After consideration of risks, benefits and other options for treatment, the patient has consented to  Procedure(s): TOTAL KNEE ARTHROPLASTY (Left) as a surgical intervention.  The patient's history has been reviewed, patient examined, no change in status, stable for surgery.  I have reviewed the patient's chart and labs.  Questions were answered to the patient's satisfaction.     Eulas Post

## 2020-12-13 NOTE — Evaluation (Signed)
Physical Therapy Evaluation Patient Details Name: Dylan Dudley MRN: [id number] DOB: 12-Aug-1961 Today's Date: 12/13/2020   History of Present Illness  patient is a 60 y.o. male s/p Lt TKA on 12/13/2020 with PMH significant for HTN, HLD, GERD, depression, anxiety, CHF, OA, RA, Rt RTCR (2013), ACDF (2012),  Clinical Impression  Pt is a 60y.o. male s/p Lt TKA POD 0. Pt reports that he is independent with mobility at baseline. Pt required MIN guard with cues for safe hand placement for sit to stand transfer. Pt required MIN assist progressing to MIN guard for safety with ambulation 122ft with verbal cues for RW management and step to gait pattern with no LOB. Pt became slightly lightheaded and BP found to be 99/20mmHG following ambulation. BP start of session was 132/36mmHG. BP improved after ~58min to 118/25mmHG with legs reclined in chair at EOS. PT reviewed therapeutic intervention for promotion of DVT prevention, pt demonstrated understanding. Pt will have assistance from his son upon discharge. Pt will benefit from skilled PT to increase independence and safety with mobility. Acute therapy to follow up during stay to progress functional mobility as able to ensure safe discharge home.       Follow Up Recommendations Outpatient PT;Follow surgeon's recommendation for DC plan and follow-up therapies    Equipment Recommendations  None recommended by PT (pt owns RW)    Recommendations for Other Services       Precautions / Restrictions Precautions Precautions: Fall Restrictions Weight Bearing Restrictions: No Other Position/Activity Restrictions: WBAT      Mobility  Bed Mobility Overal bed mobility: Needs Assistance Bed Mobility: Supine to Sit     Supine to sit: Supervision;HOB elevated     General bed mobility comments: use of B UEs to scoot to EOB with supervision for safety.    Transfers Overall transfer level: Needs assistance Equipment used: Rolling walker (2  wheeled) Transfers: Sit to/from Stand Sit to Stand: Min guard;From elevated surface         General transfer comment: MIN guard with cues for safe hand placement and review of WBAT status  Ambulation/Gait Ambulation/Gait assistance: Min assist;Min guard Gait Distance (Feet): 100 Feet Assistive device: Rolling walker (2 wheeled) Gait Pattern/deviations: Step-to pattern;Decreased stride length;Decreased weight shift to left     General Gait Details: Pt performed pre gait marching  with use of B UEs on RW with no knee buckling. MIN assist progressing to MIN guard for safety with cues for step to gait pattern and RW management with no LOB. Pt became lightheaded and BP found to be 99/28mmHG following ambulation. BP start of session 132/79mmHG.  BP improved to 118/71mmHG with legs reclined in chair at EOS.  Stairs            Wheelchair Mobility    Modified Rankin (Stroke Patients Only)       Balance Overall balance assessment: Needs assistance Sitting-balance support: Feet supported Sitting balance-Leahy Scale: Good     Standing balance support: Bilateral upper extremity supported;During functional activity Standing balance-Leahy Scale: Poor Standing balance comment: use of RW for standing balance                             Pertinent Vitals/Pain Pain Assessment: 0-10 Pain Score: 7  Pain Location: Lt knee Pain Descriptors / Indicators: Burning;Sore;Discomfort Pain Intervention(s): Limited activity within patient's tolerance;Monitored during session;Repositioned;Ice applied    Home Living Family/patient expects to be discharged to:: Private residence Living Arrangements:  Alone Available Help at Discharge: Family Type of Home: House Home Access: Stairs to enter Entrance Stairs-Rails: None Entrance Stairs-Number of Steps: 2 Home Layout: Two level;Able to live on main level with bedroom/bathroom Home Equipment: Dan Humphreys - 2 wheels;Crutches;Shower seat -  built in Additional Comments: pt's son will be assisting during recovery    Prior Function Level of Independence: Independent               Hand Dominance   Dominant Hand: Right    Extremity/Trunk Assessment   Upper Extremity Assessment Upper Extremity Assessment: Overall WFL for tasks assessed    Lower Extremity Assessment Lower Extremity Assessment: RLE deficits/detail RLE Deficits / Details: pt with good quad set strength and 4/5 B dorsi/plantar flexion strength. Pt able to complete full SLR RLE Sensation: WNL RLE Coordination: WNL    Cervical / Trunk Assessment Cervical / Trunk Assessment: Normal  Communication   Communication: No difficulties  Cognition Arousal/Alertness: Awake/alert Behavior During Therapy: WFL for tasks assessed/performed Overall Cognitive Status: Within Functional Limits for tasks assessed                                        General Comments      Exercises Total Joint Exercises Ankle Circles/Pumps: AROM;Both;20 reps;Seated   Assessment/Plan    PT Assessment Patient needs continued PT services  PT Problem List Decreased strength;Decreased activity tolerance;Decreased range of motion;Decreased mobility;Decreased balance;Decreased knowledge of use of DME;Pain       PT Treatment Interventions DME instruction;Gait training;Stair training;Functional mobility training;Therapeutic activities;Therapeutic exercise;Balance training;Patient/family education    PT Goals (Current goals can be found in the Care Plan section)  Acute Rehab PT Goals Patient Stated Goal: get back to walking 55mi with his friend PT Goal Formulation: With patient Time For Goal Achievement: 12/20/20 Potential to Achieve Goals: Good    Frequency 7X/week   Barriers to discharge        Co-evaluation               AM-PAC PT "6 Clicks" Mobility  Outcome Measure Help needed turning from your back to your side while in a flat bed without  using bedrails?: None Help needed moving from lying on your back to sitting on the side of a flat bed without using bedrails?: None Help needed moving to and from a bed to a chair (including a wheelchair)?: A Little Help needed standing up from a chair using your arms (e.g., wheelchair or bedside chair)?: A Little Help needed to walk in hospital room?: A Little Help needed climbing 3-5 steps with a railing? : A Little 6 Click Score: 20    End of Session Equipment Utilized During Treatment: Gait belt Activity Tolerance: Patient tolerated treatment well Patient left: in chair;with call bell/phone within reach;with chair alarm set;with family/visitor present Nurse Communication: Mobility status (pt BP drop during session) PT Visit Diagnosis: Unsteadiness on feet (R26.81);Muscle weakness (generalized) (M62.81);Pain Pain - Right/Left: Left Pain - part of body: Knee    Time: 5093-2671 PT Time Calculation (min) (ACUTE ONLY): 25 min   Charges:              Loyal Gambler, SPT  Acute rehab    Loyal Gambler 12/13/2020, 7:15 PM

## 2020-12-13 NOTE — Discharge Instructions (Signed)
INSTRUCTIONS AFTER JOINT REPLACEMENT   o Remove items at home which could result in a fall. This includes throw rugs or furniture in walking pathways o ICE to the affected joint every three hours while awake for 30 minutes at a time, for at least the first 3-5 days, and then as needed for pain and swelling.  Continue to use ice for pain and swelling. You may notice swelling that will progress down to the foot and ankle.  This is normal after surgery.  Elevate your leg when you are not up walking on it.   o Continue to use the breathing machine you got in the hospital (incentive spirometer) which will help keep your temperature down.  It is common for your temperature to cycle up and down following surgery, especially at night when you are not up moving around and exerting yourself.  The breathing machine keeps your lungs expanded and your temperature down.   DIET:  As you were doing prior to hospitalization, we recommend a well-balanced diet.  DRESSING / WOUND CARE / SHOWERING  You may change your dressing 3-5 days after surgery.  Then change the dressing every day with sterile gauze.  Please use good hand washing techniques before changing the dressing.  Do not use any lotions or creams on the incision until instructed by your surgeon.  ACTIVITY  o Increase activity slowly as tolerated, but follow the weight bearing instructions below.   o No driving for 6 weeks or until further direction given by your physician.  You cannot drive while taking narcotics.  o No lifting or carrying greater than 10 lbs. until further directed by your surgeon. o Avoid periods of inactivity such as sitting longer than an hour when not asleep. This helps prevent blood clots.  o You may return to work once you are authorized by your doctor.     WEIGHT BEARING   Weight bearing as tolerated with assist device (walker, cane, etc) as directed, use it as long as suggested by your surgeon or therapist, typically at  least 4-6 weeks.   EXERCISES  Results after joint replacement surgery are often greatly improved when you follow the exercise, range of motion and muscle strengthening exercises prescribed by your doctor. Safety measures are also important to protect the joint from further injury. Any time any of these exercises cause you to have increased pain or swelling, decrease what you are doing until you are comfortable again and then slowly increase them. If you have problems or questions, call your caregiver or physical therapist for advice.   Rehabilitation is important following a joint replacement. After just a few days of immobilization, the muscles of the leg can become weakened and shrink (atrophy).  These exercises are designed to build up the tone and strength of the thigh and leg muscles and to improve motion. Often times heat used for twenty to thirty minutes before working out will loosen up your tissues and help with improving the range of motion but do not use heat for the first two weeks following surgery (sometimes heat can increase post-operative swelling).   These exercises can be done on a training (exercise) mat, on the floor, on a table or on a bed. Use whatever works the best and is most comfortable for you.    Use music or television while you are exercising so that the exercises are a pleasant break in your day. This will make your life better with the exercises acting as a break   in your routine that you can look forward to.   Perform all exercises about fifteen times, three times per day or as directed.  You should exercise both the operative leg and the other leg as well.  Exercises include:   . Quad Sets - Tighten up the muscle on the front of the thigh (Quad) and hold for 5-10 seconds.   . Straight Leg Raises - With your knee straight (if you were given a brace, keep it on), lift the leg to 60 degrees, hold for 3 seconds, and slowly lower the leg.  Perform this exercise against  resistance later as your leg gets stronger.  . Leg Slides: Lying on your back, slowly slide your foot toward your buttocks, bending your knee up off the floor (only go as far as is comfortable). Then slowly slide your foot back down until your leg is flat on the floor again.  . Angel Wings: Lying on your back spread your legs to the side as far apart as you can without causing discomfort.  . Hamstring Strength:  Lying on your back, push your heel against the floor with your leg straight by tightening up the muscles of your buttocks.  Repeat, but this time bend your knee to a comfortable angle, and push your heel against the floor.  You may put a pillow under the heel to make it more comfortable if necessary.   A rehabilitation program following joint replacement surgery can speed recovery and prevent re-injury in the future due to weakened muscles. Contact your doctor or a physical therapist for more information on knee rehabilitation.    CONSTIPATION  Constipation is defined medically as fewer than three stools per week and severe constipation as less than one stool per week.  Even if you have a regular bowel pattern at home, your normal regimen is likely to be disrupted due to multiple reasons following surgery.  Combination of anesthesia, postoperative narcotics, change in appetite and fluid intake all can affect your bowels.   YOU MUST use at least one of the following options; they are listed in order of increasing strength to get the job done.  They are all available over the counter, and you may need to use some, POSSIBLY even all of these options:    Drink plenty of fluids (prune juice may be helpful) and high fiber foods Colace 100 mg by mouth twice a day  Senokot for constipation as directed and as needed Dulcolax (bisacodyl), take with full glass of water  Miralax (polyethylene glycol) once or twice a day as needed.  If you have tried all these things and are unable to have a bowel  movement in the first 3-4 days after surgery call either your surgeon or your primary doctor.    If you experience loose stools or diarrhea, hold the medications until you stool forms back up.  If your symptoms do not get better within 1 week or if they get worse, check with your doctor.  If you experience "the worst abdominal pain ever" or develop nausea or vomiting, please contact the office immediately for further recommendations for treatment.   ITCHING:  If you experience itching with your medications, try taking only a single pain pill, or even half a pain pill at a time.  You can also use Benadryl over the counter for itching or also to help with sleep.   TED HOSE STOCKINGS:  Use stockings on both legs until for at least 2 weeks or as   directed by physician office. They may be removed at night for sleeping.  MEDICATIONS:  See your medication summary on the "After Visit Summary" that nursing will review with you.  You may have some home medications which will be placed on hold until you complete the course of blood thinner medication.  It is important for you to complete the blood thinner medication as prescribed.  PRECAUTIONS:  If you experience chest pain or shortness of breath - call 911 immediately for transfer to the hospital emergency department.   If you develop a fever greater that 101 F, purulent drainage from wound, increased redness or drainage from wound, foul odor from the wound/dressing, or calf pain - CONTACT YOUR SURGEON.                                                   FOLLOW-UP APPOINTMENTS:  If you do not already have a post-op appointment, please call the office for an appointment to be seen by your surgeon.  Guidelines for how soon to be seen are listed in your "After Visit Summary", but are typically between 1-4 weeks after surgery.  OTHER INSTRUCTIONS:   Knee Replacement:  Do not place pillow under knee, focus on keeping the knee straight while resting.    POST-OPERATIVE OPIOID TAPER INSTRUCTIONS: . It is important to wean off of your opioid medication as soon as possible. If you do not need pain medication after your surgery it is ok to stop day one. . Opioids include: o Codeine, Hydrocodone(Norco, Vicodin), Oxycodone(Percocet, oxycontin) and hydromorphone amongst others.  . Long term and even short term use of opiods can cause: o Increased pain response o Dependence o Constipation o Depression o Respiratory depression o And more.  . Withdrawal symptoms can include o Flu like symptoms o Nausea, vomiting o And more . Techniques to manage these symptoms o Hydrate well o Eat regular healthy meals o Stay active o Use relaxation techniques(deep breathing, meditating, yoga) . Do Not substitute Alcohol to help with tapering . If you have been on opioids for less than two weeks and do not have pain than it is ok to stop all together.  . Plan to wean off of opioids o This plan should start within one week post op of your joint replacement. o Maintain the same interval or time between taking each dose and first decrease the dose.  o Cut the total daily intake of opioids by one tablet each day o Next start to increase the time between doses. o The last dose that should be eliminated is the evening dose.     MAKE SURE YOU:  . Understand these instructions.  . Get help right away if you are not doing well or get worse.    Thank you for letting us be a part of your medical care team.  It is a privilege we respect greatly.  We hope these instructions will help you stay on track for a fast and full recovery!      

## 2020-12-13 NOTE — Anesthesia Procedure Notes (Signed)
Anesthesia Regional Block: Adductor canal block   Pre-Anesthetic Checklist: ,, timeout performed, Correct Patient, Correct Site, Correct Laterality, Correct Procedure, Correct Position, site marked, Risks and benefits discussed, pre-op evaluation,  At surgeon's request and post-op pain management  Laterality: Left  Prep: Maximum Sterile Barrier Precautions used, chloraprep       Needles:  Injection technique: Single-shot  Needle Type: Echogenic Stimulator Needle     Needle Length: 9cm  Needle Gauge: 22     Additional Needles:   Procedures:,,,, ultrasound used (permanent image in chart),,,,  Narrative:  Start time: 12/13/2020 9:28 AM End time: 12/13/2020 9:30 AM Injection made incrementally with aspirations every 5 mL.  Performed by: Personally  Anesthesiologist: Kaylyn Layer, MD  Additional Notes: Risks, benefits, and alternative discussed. Patient gave consent for procedure. Patient prepped and draped in sterile fashion. Sedation administered, patient remains easily responsive to voice. Relevant anatomy identified with ultrasound guidance. Local anesthetic given in 5cc increments with no signs or symptoms of intravascular injection. No pain or paraesthesias with injection. Patient monitored throughout procedure with signs of LAST or immediate complications. Tolerated well. Ultrasound image placed in chart.  Dylan Greenhouse, MD

## 2020-12-13 NOTE — Anesthesia Postprocedure Evaluation (Signed)
Anesthesia Post Note  Patient: Dylan Dudley  Procedure(s) Performed: TOTAL KNEE ARTHROPLASTY (Left Knee)     Patient location during evaluation: PACU Anesthesia Type: Spinal Level of consciousness: awake and alert and oriented Pain management: pain level controlled Vital Signs Assessment: post-procedure vital signs reviewed and stable Respiratory status: spontaneous breathing, nonlabored ventilation and respiratory function stable Cardiovascular status: blood pressure returned to baseline Postop Assessment: no apparent nausea or vomiting, spinal receding, no headache and no backache Anesthetic complications: no   No complications documented.  Last Vitals:  Vitals:   12/13/20 1444 12/13/20 1445  BP:    Pulse:  62  Resp:  17  Temp:    SpO2: 100% 100%    Last Pain:  Vitals:   12/13/20 1444  TempSrc:   PainSc: 0-No pain    LLE Motor Response: Purposeful movement (12/13/20 1444) LLE Sensation: Decreased (12/13/20 1444) RLE Motor Response: Purposeful movement (12/13/20 1444) RLE Sensation: Decreased (12/13/20 1444)      Kaylyn Layer

## 2020-12-13 NOTE — Op Note (Signed)
DATE OF SURGERY:  12/13/2020 TIME: 1:06 PM  PATIENT NAME:  Dylan Dudley   AGE: 60 y.o.    PRE-OPERATIVE DIAGNOSIS: Left knee primary localized osteoarthritis  POST-OPERATIVE DIAGNOSIS:  Same  PROCEDURE: LEFT total Knee Arthroplasty  SURGEON:  Eulas Post, MD   ASSISTANT:  Janine Ores, PA-C, present and scrubbed throughout the case, critical for assistance with exposure, retraction, instrumentation, and closure.   OPERATIVE IMPLANTS: Depuy Attune size 8 posterior Stabilized Femur, with a size 8 fixed Bearing Tibia, 5 polyethylene insert with a 41 medialized oval dome polyethylene patella.  PREOPERATIVE INDICATIONS:  ROALD LUKACS is a 60 y.o. year old male with end stage bone on bone degenerative arthritis of the knee who failed conservative treatment, including injections, antiinflammatories, activity modification, and assistive devices, and had significant impairment of their activities of daily living, and elected for Total Knee Arthroplasty.   The risks, benefits, and alternatives were discussed at length including but not limited to the risks of infection, bleeding, nerve injury, stiffness, blood clots, the need for revision surgery, cardiopulmonary complications, among others, and they were willing to proceed.  OPERATIVE FINDINGS AND UNIQUE ASPECTS OF THE CASE: His knee was fairly stiff, and quite difficult to mobilize.  The patella was huge, and measured almost 30 mm in thickness before the cut.  It was very difficult to get the patella out of the way during the operation.  He had diffuse tricompartmental changes with severe osteophyte formation in all compartments.  Getting the trial polyethylene was extremely difficult, particularly getting the lateral portion of the trial to clear the lateral femoral condyle.  The real polyethylene was quite easy, I am not sure why it was so difficult with the trial, he felt like he had excellent balance.  It may have been that I  was slightly externally rotated with the femoral preparation, such that the lateral cut on the femur was smaller than the medial, although the patella tracked quite well.  ESTIMATED BLOOD LOSS: 100 mL  OPERATIVE DESCRIPTION:  The patient was brought to the operative room and placed in a supine position.  Spinal anesthesia was administered.  IV antibiotics were given.  The lower extremity was prepped and draped in the usual sterile fashion.  Time out was performed.  The leg was elevated and exsanguinated and the tourniquet was inflated.  Anterior quadriceps tendon splitting approach was performed.  The patella was everted and osteophytes were removed.  The anterior horn of the medial and lateral meniscus was removed.   The patella was then measured, and cut with the saw.  The thickness before the cut was 29.5 and after the cut was 19.  A metal shield was used to protect the patella throughout the case.    The distal femur was opened with the drill and the intramedullary distal femoral cutting jig was utilized, set at 5 degrees resecting 10 mm off the distal femur.  Care was taken to protect the collateral ligaments.  Then the extramedullary tibial cutting jig was utilized making the appropriate cut using the anterior tibial crest as a reference building in appropriate posterior slope.  Care was taken during the cut to protect the medial and collateral ligaments.  The proximal tibia was removed along with the posterior horns of the menisci.  The PCL was sacrificed.    The extensor gap was measured and found to have adequate resection, measuring to a size 5, possibly a 6 in extension.    The distal femoral  sizing jig was applied, taking care to avoid notching.  This was set at 3 degrees of external rotation.  Then the 4-in-1 cutting jig was applied and the anterior and posterior femur was cut, along with the chamfer cuts.  All posterior osteophytes were removed.  The flexion gap was then measured and  was symmetric with the extension gap.  I completed the distal femoral preparation using the appropriate jig to prepare the box.  The proximal tibia sized and prepared accordingly with the reamer and the punch, and then all components were trialed with the poly insert.  The knee was found to have excellent balance and full motion.    The above named components were then cemented into place and all excess cement was removed.  The real polyethylene implant was placed.  After the cement had cured I released the tourniquet and confirmed excellent hemostasis with no major posterior vessel injury.    The knee was easily taken through a range of motion and the patella tracked well and the knee irrigated copiously and the parapatellar and subcutaneous tissue closed with vicryl, and monocryl with steri strips for the skin.  The wounds were injected with marcaine, and dressed with sterile gauze and the patient was awakened and returned to the PACU in stable and satisfactory condition.  There were no complications.  Total tourniquet time was approximately 115 minutes minutes.

## 2020-12-14 ENCOUNTER — Encounter (HOSPITAL_COMMUNITY): Payer: Self-pay | Admitting: Orthopedic Surgery

## 2020-12-14 DIAGNOSIS — M1712 Unilateral primary osteoarthritis, left knee: Secondary | ICD-10-CM | POA: Diagnosis not present

## 2020-12-14 LAB — BASIC METABOLIC PANEL
Anion gap: 6 (ref 5–15)
Anion gap: 9 (ref 5–15)
BUN: 21 mg/dL — ABNORMAL HIGH (ref 6–20)
BUN: 18 mg/dL (ref 6–20)
CO2: 26 mmol/L (ref 22–32)
CO2: 23 mmol/L (ref 22–32)
Calcium: 8.4 mg/dL — ABNORMAL LOW (ref 8.9–10.3)
Calcium: 8.6 mg/dL — ABNORMAL LOW (ref 8.9–10.3)
Chloride: 103 mmol/L (ref 98–111)
Chloride: 102 mmol/L (ref 98–111)
Creatinine, Ser: 1.38 mg/dL — ABNORMAL HIGH (ref 0.61–1.24)
Creatinine, Ser: 1.53 mg/dL — ABNORMAL HIGH (ref 0.61–1.24)
GFR, Estimated: 59 mL/min — ABNORMAL LOW (ref >60)
GFR, Estimated: 52 mL/min — ABNORMAL LOW (ref >60)
Glucose, Bld: 139 mg/dL — ABNORMAL HIGH (ref 70–99)
Glucose, Bld: 213 mg/dL — ABNORMAL HIGH (ref 70–99)
Potassium: 3.5 mmol/L (ref 3.5–5.1)
Potassium: 3.6 mmol/L (ref 3.5–5.1)
Sodium: 135 mmol/L (ref 135–145)
Sodium: 134 mmol/L — ABNORMAL LOW (ref 135–145)

## 2020-12-14 LAB — CBC
HCT: 33.8 % — ABNORMAL LOW (ref 39.0–52.0)
Hemoglobin: 11.8 g/dL — ABNORMAL LOW (ref 13.0–17.0)
MCH: 32 pg (ref 26.0–34.0)
MCHC: 34.9 g/dL (ref 30.0–36.0)
MCV: 91.6 fL (ref 80.0–100.0)
Platelets: 171 10*3/uL (ref 150–400)
RBC: 3.69 MIL/uL — ABNORMAL LOW (ref 4.22–5.81)
RDW: 13.3 % (ref 11.5–15.5)
WBC: 5.2 10*3/uL (ref 4.0–10.5)
nRBC: 0 % (ref 0.0–0.2)

## 2020-12-14 MED ORDER — ONDANSETRON HCL 4 MG PO TABS: 4.0000 mg | ORAL_TABLET | Freq: Three times a day (TID) | ORAL | 0 refills | Status: DC | PRN

## 2020-12-14 MED ORDER — SODIUM CHLORIDE 0.9 % IV BOLUS
500.0000 mL | Freq: Once | INTRAVENOUS | Status: AC
  Administered 2020-12-14: 500 mL via INTRAVENOUS

## 2020-12-14 MED ORDER — OXYCODONE HCL 5 MG PO TABS: 5.0000 mg | ORAL_TABLET | ORAL | 0 refills | Status: DC | PRN

## 2020-12-14 MED ORDER — BACLOFEN 10 MG PO TABS: 10.0000 mg | ORAL_TABLET | Freq: Three times a day (TID) | ORAL | 0 refills | Status: DC

## 2020-12-14 NOTE — TOC Transition Note (Signed)
Transition of Care [facility]) - CM/SW Discharge Note   Patient Details  Name: Dylan Dudley MRN: [id number] Date of Birth: 09-Dec-1960  Transition of Care [facility]) CM/SW Contact:  Lennart Pall, LCSW Phone Number: 12/14/2020, 10:07 AM   Clinical Narrative:    Met briefly with pt who confirms plans for OPPT already arranged in [place].  Has all needed DME at home.  No TOC needs.   Final next level of care: OP Rehab Barriers to Discharge: No Barriers Identified   Patient Goals and CMS Choice Patient states their goals for this hospitalization and ongoing recovery are:: return home      Discharge Placement                       Discharge Plan and Services                DME Arranged: N/A DME Agency: NA                  Social Determinants of Health (SDOH) Interventions     Readmission Risk Interventions No flowsheet data found.

## 2020-12-14 NOTE — Progress Notes (Signed)
Physical Therapy Treatment Patient Details Name: Dylan Dudley MRN: [id number] DOB: 03/08/1961 Today's Date: 12/14/2020    History of Present Illness patient is a 60 y.o. male s/p Lt TKA on 12/13/2020 with PMH significant for HTN, HLD, GERD, depression, anxiety, CHF, OA, RA, Rt RTCR (2013), ACDF (2012),    PT Comments    Pt assisted with ambulating in hallway, practiced safe stair technique, and performed LE exercises.  Pt provided with HEP and stair handouts. Pt had no further questions and feels ready for d/c home today.   Follow Up Recommendations  Outpatient PT;Follow surgeon's recommendation for DC plan and follow-up therapies     Equipment Recommendations  None recommended by PT    Recommendations for Other Services       Precautions / Restrictions Precautions Precautions: Fall;Knee Restrictions Other Position/Activity Restrictions: WBAT    Mobility  Bed Mobility Overal bed mobility: Needs Assistance Bed Mobility: Supine to Sit     Supine to sit: Supervision;HOB elevated          Transfers Overall transfer level: Needs assistance Equipment used: Rolling walker (2 wheeled) Transfers: Sit to/from Stand Sit to Stand: Min guard         General transfer comment: verbal cues for UE and LE positioning  Ambulation/Gait Ambulation/Gait assistance: Min guard Gait Distance (Feet): 200 Feet Assistive device: Rolling walker (2 wheeled) Gait Pattern/deviations: Step-to pattern;Step-through pattern;Antalgic;Decreased stance time - left     General Gait Details: verbal cues for sequencing, RW positioning, step length, pt able to progress to step through pattern; pt denies dizziness   Stairs Stairs: Yes Stairs assistance: Min guard Stair Management: Step to pattern;Backwards;With walker Number of Stairs: 2 General stair comments: verbal cues for sequence, RW positioning, safety; pt performed twice; pt reports understanding; provided handout   Wheelchair  Mobility    Modified Rankin (Stroke Patients Only)       Balance                                            Cognition Arousal/Alertness: Awake/alert Behavior During Therapy: WFL for tasks assessed/performed Overall Cognitive Status: Within Functional Limits for tasks assessed                                        Exercises Total Joint Exercises Ankle Circles/Pumps: AROM;Both;10 reps Quad Sets: 10 reps;AROM;Left Short Arc Quad: AAROM;Left;10 reps Heel Slides: AAROM;Left;10 reps Hip ABduction/ADduction: AAROM;Left;10 reps Straight Leg Raises: AAROM;Left;10 reps    General Comments        Pertinent Vitals/Pain Pain Assessment: 0-10 Pain Score: 6  Pain Location: Lt knee Pain Descriptors / Indicators: Sore;Discomfort;Aching Pain Intervention(s): Repositioned;Monitored during session    Home Living                      Prior Function            PT Goals (current goals can now be found in the care plan section) Progress towards PT goals: Progressing toward goals    Frequency    7X/week      PT Plan Current plan remains appropriate    Co-evaluation              AM-PAC PT "6 Clicks" Mobility   Outcome Measure  Help needed  turning from your back to your side while in a flat bed without using bedrails?: None Help needed moving from lying on your back to sitting on the side of a flat bed without using bedrails?: None Help needed moving to and from a bed to a chair (including a wheelchair)?: A Little Help needed standing up from a chair using your arms (e.g., wheelchair or bedside chair)?: A Little Help needed to walk in hospital room?: A Little Help needed climbing 3-5 steps with a railing? : A Little 6 Click Score: 20    End of Session Equipment Utilized During Treatment: Gait belt Activity Tolerance: Patient tolerated treatment well Patient left: in chair;with call bell/phone within reach;with chair alarm  set   PT Visit Diagnosis: Muscle weakness (generalized) (M62.81);Difficulty in walking, not elsewhere classified (R26.2)     Time: 0100-7121 PT Time Calculation (min) (ACUTE ONLY): 26 min  Charges:  $Gait Training: 8-22 mins $Therapeutic Exercise: 8-22 mins                    Thomasene Mohair PT, DPT Acute Rehabilitation Services Pager: [phone] Office: [phone]   Sarajane Jews 12/14/2020, 12:57 PM

## 2020-12-14 NOTE — Progress Notes (Addendum)
Subjective: 1 Day Post-Op s/p Procedure(s): TOTAL KNEE ARTHROPLASTY   Patient is alert, oriented. Patient reports pain as mild, had just received pain medication. Denies chest pain, SOB, Calf pain. No nausea/vomiting. No other complaints. Had a bowel movement yesterday and already this morning.   Objective:  PE: VITALS:   Vitals:   12/13/20 2024 12/13/20 2043 12/14/20 0203 12/14/20 0610  BP:  119/71 103/60 111/80  Pulse:  76 78 84  Resp:  18 18 17   Temp:  97.7 F (36.5 C) 98.4 F (36.9 C) 98.8 F (37.1 C)  TempSrc:  Oral Oral Oral  SpO2: 100% 98% 99% 98%  Weight:      Height:       General: sitting up in bed, in no acute distress Cardio: normal rate and rhythm Pulm: lungs clear to auscultation bilaterally GI: abdomen soft, nontender MSK:  Sensation intact distally Intact pulses distally Dorsiflexion/Plantar flexion intact Incision: scant drainage Compartment soft  LABS  Results for orders placed or performed during the hospital encounter of 12/13/20 (from the past 24 hour(s))  CBC     Status: Abnormal   Collection Time: 12/14/20  3:09 AM  Result Value Ref Range   WBC 5.2 4.0 - 10.5 K/uL   RBC 3.69 (L) 4.22 - 5.81 MIL/uL   Hemoglobin 11.8 (L) 13.0 - 17.0 g/dL   HCT 12/16/20 (L) 22.0 - 25.4 %   MCV 91.6 80.0 - 100.0 fL   MCH 32.0 26.0 - 34.0 pg   MCHC 34.9 30.0 - 36.0 g/dL   RDW 27.0 62.3 - 76.2 %   Platelets 171 150 - 400 K/uL   nRBC 0.0 0.0 - 0.2 %  Basic metabolic panel     Status: Abnormal   Collection Time: 12/14/20  3:09 AM  Result Value Ref Range   Sodium 135 135 - 145 mmol/L   Potassium 3.5 3.5 - 5.1 mmol/L   Chloride 103 98 - 111 mmol/L   CO2 26 22 - 32 mmol/L   Glucose, Bld 139 (H) 70 - 99 mg/dL   BUN 21 (H) 6 - 20 mg/dL   Creatinine, Ser 12/16/20 (H) 0.61 - 1.24 mg/dL   Calcium 8.4 (L) 8.9 - 10.3 mg/dL   GFR, Estimated 59 (L) >60 mL/min   Anion gap 6 5 - 15    DG Knee Left Port  Result Date: 12/13/2020 CLINICAL DATA:  Status post total knee  replacement. EXAM: PORTABLE LEFT KNEE - 1-2 VIEW COMPARISON:  09/30/2020 outside radiographs. FINDINGS: Total left knee arthroplasty. Expected anterior edema and gas. No complicating fracture. IMPRESSION: Expected appearance after left knee arthroplasty. Electronically Signed   By: 11/28/2020 M.D.   On: 12/13/2020 17:59    Assessment/Plan: Principal Problem:   Osteoarthritis of left knee Active Problems:   S/P TKR (total knee replacement), left  1 Day Post-Op s/p Procedure(s): TOTAL KNEE ARTHROPLASTY - patient was dizzy during PT yesterday with BP drop which resolved after returning to bed. Kept fluids low yesterday due to CHF, will give bolus this AM since patient had increase in Cr this morning and also to help with dizziness when working with PT. Will repeat BMP prior to discharge.   Weightbearing: WBAT LLE Insicional and dressing care: Reinforce dressings as needed, dressing changed this am VTE prophylaxis: restarting home eliquis Pain control: continue current regimen Follow - up plan: 2 weeks with Dr. 12/15/2020 Dispo: pending PT session today, plan is for discharge home this afternoon  Contact information:  Weekdays 8-5 Janine Ores, PA-C [phone] A fter hours and holidays please check [email or web address] for group call information for [facility]  Armida Sans 12/14/2020, 7:51 AM

## 2020-12-14 NOTE — Discharge Summary (Signed)
Discharge Summary  Patient ID: Dylan Dudley MRN: [id number] DOB/AGE: 06/07/1961 60 y.o.  Admit date: 12/13/2020 Discharge date: 12/14/2020  Admission Diagnoses:  Osteoarthritis of left knee  Discharge Diagnoses:  Principal Problem:   Osteoarthritis of left knee Active Problems:   S/P TKR (total knee replacement), left   Past Medical History:  Diagnosis Date  . AC (acromioclavicular) arthritis 03/14/2012  . Anxiety   . Arthritis    "shoulders; knees" (07/17/2013)  . Chronic bronchitis (HCC)   . Congestive heart failure (CHF) (HCC)    per patient   . DDD (degenerative disc disease)   . Depression   . Disorder of tendon of biceps 03/14/2012  . GERD (gastroesophageal reflux disease)   . Hyperlipemia   . Hypertension   . Impingement syndrome of right shoulder 03/14/2012  . Pneumonia 2012   "once" (07/17/2013)  . Rheumatoid arthritis (HCC)   . Seasonal allergies   . Sleep apnea    CPAP   . Tear of right rotator cuff 03/14/2012    Surgeries: Procedure(s): TOTAL KNEE ARTHROPLASTY on 12/13/2020   Consultants (if any):   Discharged Condition: Improved  Hospital Course: Dylan Dudley is an 60 y.o. male who was admitted 12/13/2020 with a diagnosis of Osteoarthritis of left knee and went to the operating room on 12/13/2020 and underwent the above named procedures.    He was given perioperative antibiotics:  Anti-infectives (From admission, onward)   Start     Dose/Rate Route Frequency Ordered Stop   12/13/20 1630  ceFAZolin (ANCEF) IVPB 2g/100 mL premix        2 g 200 mL/hr over 30 Minutes Intravenous Every 6 hours 12/13/20 1534 12/14/20 0752   12/13/20 0745  ceFAZolin (ANCEF) IVPB 2g/100 mL premix        2 g 200 mL/hr over 30 Minutes Intravenous On call to O.R. 12/13/20 9381 12/13/20 1044    .  He was given sequential compression devices, early ambulation, and home Eliquis for DVT prophylaxis.  He benefited maximally from the hospital stay and there were no  complications.    Recent vital signs:  Vitals:   12/14/20 0610 12/14/20 0804  BP: 111/80   Pulse: 84   Resp: 17   Temp: 98.8 F (37.1 C)   SpO2: 98% 95%    Recent laboratory studies:  Lab Results  Component Value Date   HGB 11.8 (L) 12/14/2020   HGB 14.2 12/06/2020   HGB 14.4 12/17/2019   Lab Results  Component Value Date   WBC 5.2 12/14/2020   PLT 171 12/14/2020   No results found for: INR Lab Results  Component Value Date   NA 134 (L) 12/14/2020   K 3.6 12/14/2020   CL 102 12/14/2020   CO2 23 12/14/2020   BUN 18 12/14/2020   CREATININE 1.53 (H) 12/14/2020   GLUCOSE 213 (H) 12/14/2020    Discharge Medications:   Allergies as of 12/14/2020      Reactions   Codeine    Pt not sure why this is listed   Daptomycin Hives, Itching   Hydroxyzine Swelling   Other reaction(s): Lip swelling   Latex Hives, Itching   Vancomycin Hives, Itching      Medication List    STOP taking these medications   traZODone 100 MG tablet Commonly known as: DESYREL     TAKE these medications   albuterol 0.63 MG/3ML nebulizer solution Commonly known as: ACCUNEB Take 1 ampule by nebulization every 6 (six) hours as needed  for shortness of breath or wheezing.   aspirin 81 MG tablet Take 81 mg by mouth daily.   baclofen 10 MG tablet Commonly known as: LIORESAL Take 1 tablet (10 mg total) by mouth 3 (three) times daily. As needed for muscle spasm   benzonatate 200 MG capsule Commonly known as: TESSALON Take by mouth as needed.   buPROPion 150 MG 12 hr tablet Commonly known as: WELLBUTRIN SR Take 150 mg by mouth daily.   carvedilol 6.25 MG tablet Commonly known as: COREG Take 6.25 mg by mouth 2 (two) times daily.   chlorthalidone 25 MG tablet Commonly known as: HYGROTON Take 12.5 mg by mouth 2 (two) times daily.   cholecalciferol 25 MCG (1000 UNIT) tablet Commonly known as: VITAMIN D Take 1,000 Units by mouth daily.   Eliquis 2.5 MG Tabs tablet Generic drug:  apixaban Take 2.5 mg by mouth 2 (two) times daily.   Entresto 97-103 MG Generic drug: sacubitril-valsartan Take 1 tablet by mouth 2 (two) times daily.   fluticasone 50 MCG/ACT nasal spray Commonly known as: FLONASE Place into the nose as needed.   loratadine 10 MG tablet Commonly known as: CLARITIN Take 10 mg by mouth daily.   ondansetron 4 MG tablet Commonly known as: Zofran Take 1 tablet (4 mg total) by mouth every 8 (eight) hours as needed for nausea or vomiting.   Orencia ClickJect 125 MG/ML Soaj Generic drug: Abatacept INJECT 125 MG INTO THE SKIN ONCE A WEEK.   oxyCODONE 5 MG immediate release tablet Commonly known as: Roxicodone Take 1 tablet (5 mg total) by mouth every 4 (four) hours as needed for severe pain.   pantoprazole 20 MG tablet Commonly known as: PROTONIX Take 20 mg by mouth daily.   rosuvastatin 20 MG tablet Commonly known as: CRESTOR Take 20 mg by mouth at bedtime.   sildenafil 100 MG tablet Commonly known as: VIAGRA Take 100 mg by mouth daily as needed for erectile dysfunction.   Symbicort 160-4.5 MCG/ACT inhaler Generic drug: budesonide-formoterol Inhale 2 puffs into the lungs in the morning and at bedtime.       Diagnostic Studies: DG Knee Left Port  Result Date: 12/13/2020 CLINICAL DATA:  Status post total knee replacement. EXAM: PORTABLE LEFT KNEE - 1-2 VIEW COMPARISON:  09/30/2020 outside radiographs. FINDINGS: Total left knee arthroplasty. Expected anterior edema and gas. No complicating fracture. IMPRESSION: Expected appearance after left knee arthroplasty. Electronically Signed   By: Jeronimo Greaves M.D.   On: 12/13/2020 17:59    Disposition: Discharge disposition: 01-Home or Self Care          Follow-up Information    Teryl Lucy, MD. Schedule an appointment as soon as possible for a visit in 2 weeks.   Specialty: Orthopedic Surgery Contact information: [address] [place]  [address] [phone]                Signed: Annita Brod 12/14/2020, 2:11 PM

## 2020-12-14 NOTE — Plan of Care (Signed)
Patient discharged home in stable condition 

## 2020-12-20 ENCOUNTER — Other Ambulatory Visit (HOSPITAL_COMMUNITY): Payer: Self-pay

## 2020-12-29 ENCOUNTER — Other Ambulatory Visit (HOSPITAL_COMMUNITY): Payer: Self-pay

## 2020-12-29 MED FILL — Abatacept Subcutaneous Soln Auto-Injector 125 MG/ML: SUBCUTANEOUS | 28 days supply | Qty: 4 | Fill #0 | Status: AC

## 2020-12-30 ENCOUNTER — Other Ambulatory Visit (HOSPITAL_COMMUNITY): Payer: Self-pay

## 2021-01-12 ENCOUNTER — Other Ambulatory Visit (HOSPITAL_COMMUNITY): Payer: Self-pay

## 2021-02-10 ENCOUNTER — Other Ambulatory Visit (HOSPITAL_COMMUNITY): Payer: Self-pay

## 2021-02-13 ENCOUNTER — Other Ambulatory Visit (HOSPITAL_COMMUNITY): Payer: Self-pay

## 2021-02-13 MED FILL — Abatacept Subcutaneous Soln Auto-Injector 125 MG/ML: SUBCUTANEOUS | 28 days supply | Qty: 4 | Fill #1 | Status: AC

## 2021-02-15 ENCOUNTER — Other Ambulatory Visit (HOSPITAL_COMMUNITY): Payer: Self-pay

## 2021-03-13 ENCOUNTER — Other Ambulatory Visit (HOSPITAL_COMMUNITY): Payer: Self-pay

## 2021-03-13 MED FILL — Abatacept Subcutaneous Soln Auto-Injector 125 MG/ML: SUBCUTANEOUS | 28 days supply | Qty: 4 | Fill #2 | Status: AC

## 2021-03-15 ENCOUNTER — Other Ambulatory Visit (HOSPITAL_COMMUNITY): Payer: Self-pay

## 2021-04-04 ENCOUNTER — Telehealth: Payer: Self-pay

## 2021-04-04 ENCOUNTER — Other Ambulatory Visit: Payer: Self-pay | Admitting: *Deleted

## 2021-04-04 DIAGNOSIS — Z79899 Other long term (current) drug therapy: Secondary | ICD-10-CM

## 2021-04-04 NOTE — Telephone Encounter (Signed)
Lab Orders faxed

## 2021-04-04 NOTE — Telephone Encounter (Signed)
Patient called requesting his labwork orders be faxed to his PCP Dr. Vernell Leep at [phone].  Patient states he has appointment tomorrow 04/05/21.

## 2021-04-05 ENCOUNTER — Telehealth: Payer: Self-pay | Admitting: *Deleted

## 2021-04-05 NOTE — Telephone Encounter (Signed)
Labs received from:[facility] on:04/05/2021 Reviewed by:Sherron Ales, PA-C  Labs drawn:CBC w/Diff, Lipid Panel, CMP  Results: BUN   29 Glucose  106 HCT  39.5 WBC  3.9 RBC  4.07 HGB  13.1

## 2021-04-11 ENCOUNTER — Other Ambulatory Visit (HOSPITAL_COMMUNITY): Payer: Self-pay

## 2021-04-11 ENCOUNTER — Other Ambulatory Visit: Payer: Self-pay | Admitting: Physician Assistant

## 2021-04-11 MED ORDER — ORENCIA CLICKJECT 125 MG/ML ~~LOC~~ SOAJ
125.0000 mg | SUBCUTANEOUS | 0 refills | Status: DC
  Filled 2021-04-11 – 2021-04-13 (×2): qty 12, 84d supply, fill #0
  Filled 2021-04-17: qty 4, 28d supply, fill #0
  Filled 2021-05-09: qty 4, 28d supply, fill #1
  Filled 2021-06-08: qty 4, 28d supply, fill #2

## 2021-04-11 NOTE — Telephone Encounter (Signed)
Next Visit: 05/01/2021   Last Visit: 11/28/2020   Last Fill: 12/12/2020  [id number] arthritis with rheumatoid factor of multiple sites without organ or systems involvement    Current Dose per office note 11/28/2020, Orencia 125 mg sq injections once weekly monotherapy   Labs: 04/05/2021 BUN                 29 Glucose           106 HCT                 39.5 WBC                3.9 RBC                 4.07 HGB                13.1  TB Gold: 11/28/2020, negative   Okay to refill Orencia?

## 2021-04-12 ENCOUNTER — Other Ambulatory Visit (HOSPITAL_COMMUNITY): Payer: Self-pay

## 2021-04-13 ENCOUNTER — Other Ambulatory Visit (HOSPITAL_COMMUNITY): Payer: Self-pay

## 2021-04-17 ENCOUNTER — Telehealth: Payer: Self-pay | Admitting: Pharmacist

## 2021-04-17 ENCOUNTER — Other Ambulatory Visit (HOSPITAL_COMMUNITY): Payer: Self-pay

## 2021-04-17 NOTE — Progress Notes (Deleted)
Office Visit Note  Patient: Dylan Dudley             Date of Birth: 1961-03-07           MRN: [id number]             PCP: Elise Benne, MD Referring: Elise Benne, MD Visit Date: 05/01/2021 Occupation: @GUAROCC @  Subjective:    History of Present Illness: Dylan Dudley is a 60 y.o. male with history of seropositive rheumatoid arthritis and osteoarthritis.  He is on orencia 125 mg sq injections every week.   CBC and CMP updated on 04/05/21.  He will be due to update lab work in October and every 3 months.   TB gold negative on 11/28/20.    Activities of Daily Living:  Patient reports morning stiffness for *** {minute/hour:19697}.   Patient {ACTIONS;DENIES/REPORTS:21021675::"Denies"} nocturnal pain.  Difficulty dressing/grooming: {ACTIONS;DENIES/REPORTS:21021675::"Denies"} Difficulty climbing stairs: {ACTIONS;DENIES/REPORTS:21021675::"Denies"} Difficulty getting out of chair: {ACTIONS;DENIES/REPORTS:21021675::"Denies"} Difficulty using hands for taps, buttons, cutlery, and/or writing: {ACTIONS;DENIES/REPORTS:21021675::"Denies"}  No Rheumatology ROS completed.   PMFS History:  Patient Active Problem List   Diagnosis Date Noted   S/P TKR (total knee replacement), left 12/13/2020   Osteoarthritis of left knee 11/28/2020   Rheumatoid arthritis with rheumatoid factor of multiple sites without organ or systems involvement (HCC) 09/10/2016   Primary osteoarthritis of both hands 09/10/2016   Primary osteoarthritis of both knees 09/10/2016   Primary osteoarthritis of both feet 09/10/2016   DJD (degenerative joint disease), cervical 09/10/2016   History of tremor 09/10/2016   History of sleep apnea 09/10/2016   Coronary artery disease involving native heart 09/10/2016   Smoker 09/10/2016   High risk medication use 09/10/2016   Tear of right rotator cuff 03/14/2012   Impingement syndrome of right shoulder 03/14/2012   AC (acromioclavicular) arthritis, right  03/14/2012   Disorder of tendon of biceps 03/14/2012    Past Medical History:  Diagnosis Date   AC (acromioclavicular) arthritis 03/14/2012   Anxiety    Arthritis    "shoulders; knees" (07/17/2013)   Chronic bronchitis (HCC)    Congestive heart failure (CHF) (HCC)    per patient    DDD (degenerative disc disease)    Depression    Disorder of tendon of biceps 03/14/2012   GERD (gastroesophageal reflux disease)    Hyperlipemia    Hypertension    Impingement syndrome of right shoulder 03/14/2012   Pneumonia 2012   "once" (07/17/2013)   Rheumatoid arthritis (HCC)    Seasonal allergies    Sleep apnea    CPAP    Tear of right rotator cuff 03/14/2012    Family History  Problem Relation Age of Onset   Breast cancer Mother    Hypertension Mother    Hypertension Father    Multiple sclerosis Sister    Past Surgical History:  Procedure Laterality Date   ANTERIOR CERVICAL DECOMP/DISCECTOMY FUSION  08/2011   [place]   BUNIONECTOMY Bilateral 1998   DEBRIDEMENT AND CLOSURE WOUND  12/2012   "back of my neck" (07/17/2013)   DEBRIDEMENT AND CLOSURE WOUND N/A 07/17/2013   Procedure: DEBRIDEMENT  OF SKIN, SUBCUTANEOUS TISSUE BONE, MUSCLE FLAP, AND CLOSURE WOUND;  Surgeon: 07/19/2013, MD;  Location: [facility] OR;  Service: Plastics;  Laterality: N/A;   EXCISIONAL HEMORRHOIDECTOMY  ~ 1996   INCISION AND DRAINAGE OF WOUND  10/2012 X 2   "back of my neck" (07/17/2013)   MUSCLE FLAP CLOSURE  07/17/2013   "to back of my neck" (07/17/2013)  NECK SURGERY  09/2012   "spur removed from back of my neck" (07/17/2013)   SHOULDER ARTHROSCOPY Left 2008   SHOULDER ARTHROSCOPY W/ ROTATOR CUFF REPAIR Right 2013   "and bicep repair" (07/17/2013)   TONSILLECTOMY  ~ 1974   TOTAL KNEE ARTHROPLASTY Left 12/13/2020   Procedure: TOTAL KNEE ARTHROPLASTY;  Surgeon: Teryl Lucy, MD;  Location: [facility];  Service: Orthopedics;  Laterality: Left;   Social History   Social History Narrative   Not on file    Immunization History  Administered Date(s) Administered   Influenza,inj,Quad PF,6+ Mos 07/18/2013   Moderna Sars-Covid-2 Vaccination 10/31/2019, 11/28/2019, 07/22/2020   Pneumococcal Polysaccharide-23 07/18/2013   Tdap 11/28/2006, 04/30/2014     Objective: Vital Signs: There were no vitals taken for this visit.   Physical Exam Vitals and nursing note reviewed.  Constitutional:      Appearance: He is well-developed.  HENT:     Head: Normocephalic and atraumatic.  Eyes:     Conjunctiva/sclera: Conjunctivae normal.     Pupils: Pupils are equal, round, and reactive to light.  Pulmonary:     Effort: Pulmonary effort is normal.  Abdominal:     Palpations: Abdomen is soft.  Musculoskeletal:     Cervical back: Normal range of motion and neck supple.  Skin:    General: Skin is warm and dry.     Capillary Refill: Capillary refill takes less than 2 seconds.  Neurological:     Mental Status: He is alert and oriented to person, place, and time.  Psychiatric:        Behavior: Behavior normal.     Musculoskeletal Exam: ***  CDAI Exam: CDAI Score: -- Patient Global: --; Provider Global: -- Swollen: --; Tender: -- Joint Exam 05/01/2021   No joint exam has been documented for this visit   There is currently no information documented on the homunculus. Go to the Rheumatology activity and complete the homunculus joint exam.  Investigation: No additional findings.  Imaging: No results found.  Recent Labs: Lab Results  Component Value Date   WBC 5.2 12/14/2020   HGB 11.8 (L) 12/14/2020   PLT 171 12/14/2020   NA 134 (L) 12/14/2020   K 3.6 12/14/2020   CL 102 12/14/2020   CO2 23 12/14/2020   GLUCOSE 213 (H) 12/14/2020   BUN 18 12/14/2020   CREATININE 1.53 (H) 12/14/2020   BILITOT 0.7 12/17/2019   ALKPHOS 68 01/10/2017   AST 19 12/17/2019   ALT 19 12/17/2019   PROT 7.0 12/17/2019   ALBUMIN 4.6 01/10/2017   CALCIUM 8.6 (L) 12/14/2020   GFRAA 79 12/17/2019    QFTBGOLDPLUS NEGATIVE 11/28/2020    Speciality Comments: PLQ Eye Exam: 11/19/2018 WNL @ [facility] Follow up in 1 year  Procedures:  No procedures performed Allergies: Codeine, Daptomycin, Hydroxyzine, Latex, and Vancomycin   Assessment / Plan:     Visit Diagnoses: No diagnosis found.  Orders: No orders of the defined types were placed in this encounter.  No orders of the defined types were placed in this encounter.   Face-to-face time spent with patient was *** minutes. Greater than 50% of time was spent in counseling and coordination of care.  Follow-Up Instructions: No follow-ups on file.   Ellen Henri, CMA  Note - This record has been created using Animal nutritionist.  Chart creation errors have been sought, but may not always  have been located. Such creation errors do not reflect on  the standard of medical care.

## 2021-04-17 NOTE — Telephone Encounter (Signed)
Received notification from [facility] that patient's Orencia Clickject copay has increased to $210 per 84 day supply.  However patient has been paying $70 per 28 day supply. Ocie Doyne has reached out to [facility] staff to advise that they re-bill for 28 day supply online and ship same qty as before  Chesley Mires, PharmD, MPH, BCPS Clinical Pharmacist (Rheumatology and Pulmonology)

## 2021-05-01 ENCOUNTER — Ambulatory Visit: Payer: Medicare Other | Admitting: Physician Assistant

## 2021-05-01 DIAGNOSIS — M19071 Primary osteoarthritis, right ankle and foot: Secondary | ICD-10-CM

## 2021-05-01 DIAGNOSIS — Z8669 Personal history of other diseases of the nervous system and sense organs: Secondary | ICD-10-CM

## 2021-05-01 DIAGNOSIS — M19041 Primary osteoarthritis, right hand: Secondary | ICD-10-CM

## 2021-05-01 DIAGNOSIS — M170 Bilateral primary osteoarthritis of knee: Secondary | ICD-10-CM

## 2021-05-01 DIAGNOSIS — Z8679 Personal history of other diseases of the circulatory system: Secondary | ICD-10-CM

## 2021-05-01 DIAGNOSIS — M0579 Rheumatoid arthritis with rheumatoid factor of multiple sites without organ or systems involvement: Secondary | ICD-10-CM

## 2021-05-01 DIAGNOSIS — Z8659 Personal history of other mental and behavioral disorders: Secondary | ICD-10-CM

## 2021-05-01 DIAGNOSIS — Z820 Family history of epilepsy and other diseases of the nervous system: Secondary | ICD-10-CM

## 2021-05-01 DIAGNOSIS — Z87891 Personal history of nicotine dependence: Secondary | ICD-10-CM

## 2021-05-01 DIAGNOSIS — M7541 Impingement syndrome of right shoulder: Secondary | ICD-10-CM

## 2021-05-01 DIAGNOSIS — M5030 Other cervical disc degeneration, unspecified cervical region: Secondary | ICD-10-CM

## 2021-05-01 DIAGNOSIS — Z79899 Other long term (current) drug therapy: Secondary | ICD-10-CM

## 2021-05-09 ENCOUNTER — Other Ambulatory Visit (HOSPITAL_COMMUNITY): Payer: Self-pay

## 2021-05-11 ENCOUNTER — Other Ambulatory Visit (HOSPITAL_COMMUNITY): Payer: Self-pay

## 2021-06-06 ENCOUNTER — Other Ambulatory Visit (HOSPITAL_COMMUNITY): Payer: Self-pay

## 2021-06-08 ENCOUNTER — Other Ambulatory Visit (HOSPITAL_COMMUNITY): Payer: Self-pay

## 2021-07-04 ENCOUNTER — Other Ambulatory Visit (HOSPITAL_COMMUNITY): Payer: Self-pay

## 2021-07-04 ENCOUNTER — Other Ambulatory Visit: Payer: Self-pay | Admitting: Physician Assistant

## 2021-07-04 MED ORDER — ORENCIA CLICKJECT 125 MG/ML ~~LOC~~ SOAJ
125.0000 mg | SUBCUTANEOUS | 0 refills | Status: DC
  Filled 2021-07-04: qty 4, 28d supply, fill #0
  Filled 2021-08-02: qty 4, 28d supply, fill #1
  Filled 2021-09-04: qty 4, 28d supply, fill #2

## 2021-07-04 NOTE — Telephone Encounter (Signed)
Next Visit: 07/07/2021  Last Visit: 11/28/2020  Last Fill: 04/11/2021  DH:[id number] arthritis with rheumatoid factor of multiple sites without organ or systems involvement   Current Dose per office note on 11/28/2020: Orencia 125 mg sq injections once weekly monotherapy  Labs: 04/05/2021 Labs drawn:CBC w/Diff, Lipid Panel, CMP   Results: BUN                 29 Glucose           106 HCT                 39.5 WBC                3.9 RBC                 4.07 HGB                13.1  TB Gold: 11/28/2020 negative    Okay to refill orencia?   Patient will update labs on 07/07/2021 at appointment.

## 2021-07-05 ENCOUNTER — Other Ambulatory Visit (HOSPITAL_COMMUNITY): Payer: Self-pay

## 2021-07-06 NOTE — Progress Notes (Deleted)
Office Visit Note  Patient: Dylan Dudley             Date of Birth: 05-20-1961           MRN: [id number]             PCP: Elise Benne, MD Referring: Elise Benne, MD Visit Date: 07/07/2021 Occupation: @GUAROCC @  Subjective:    History of Present Illness: Dylan Dudley is a 60 y.o. male with history of seropositive rheumatoid arthritis, osteoarthritis, and DDD.  He remains on Orencia 125 mg subcutaneous injections once weekly as monotherapy.  TB gold negative on 11/28/2020 and will continue to be monitored yearly.  CBC and CMP updated on 04/05/2021.  He is due to update lab work today.  Orders for CBC and CMP were released.  His next lab work will be due in January and every 3 months to monitor for drug toxicity.  Standing orders for CBC and CMP remain in place. Discussed the importance of holding Orencia if he develops signs or symptoms of infection and to resume once the infection is completely cleared.  Activities of Daily Living:  Patient reports morning stiffness for *** {minute/hour:19697}.   Patient {ACTIONS;DENIES/REPORTS:21021675::"Denies"} nocturnal pain.  Difficulty dressing/grooming: {ACTIONS;DENIES/REPORTS:21021675::"Denies"} Difficulty climbing stairs: {ACTIONS;DENIES/REPORTS:21021675::"Denies"} Difficulty getting out of chair: {ACTIONS;DENIES/REPORTS:21021675::"Denies"} Difficulty using hands for taps, buttons, cutlery, and/or writing: {ACTIONS;DENIES/REPORTS:21021675::"Denies"}  No Rheumatology ROS completed.   PMFS History:  Patient Active Problem List   Diagnosis Date Noted   S/P TKR (total knee replacement), left 12/13/2020   Osteoarthritis of left knee 11/28/2020   Rheumatoid arthritis with rheumatoid factor of multiple sites without organ or systems involvement (HCC) 09/10/2016   Primary osteoarthritis of both hands 09/10/2016   Primary osteoarthritis of both knees 09/10/2016   Primary osteoarthritis of both feet 09/10/2016   DJD  (degenerative joint disease), cervical 09/10/2016   History of tremor 09/10/2016   History of sleep apnea 09/10/2016   Coronary artery disease involving native heart 09/10/2016   Smoker 09/10/2016   High risk medication use 09/10/2016   Tear of right rotator cuff 03/14/2012   Impingement syndrome of right shoulder 03/14/2012   AC (acromioclavicular) arthritis, right 03/14/2012   Disorder of tendon of biceps 03/14/2012    Past Medical History:  Diagnosis Date   AC (acromioclavicular) arthritis 03/14/2012   Anxiety    Arthritis    "shoulders; knees" (07/17/2013)   Chronic bronchitis (HCC)    Congestive heart failure (CHF) (HCC)    per patient    DDD (degenerative disc disease)    Depression    Disorder of tendon of biceps 03/14/2012   GERD (gastroesophageal reflux disease)    Hyperlipemia    Hypertension    Impingement syndrome of right shoulder 03/14/2012   Pneumonia 2012   "once" (07/17/2013)   Rheumatoid arthritis (HCC)    Seasonal allergies    Sleep apnea    CPAP    Tear of right rotator cuff 03/14/2012    Family History  Problem Relation Age of Onset   Breast cancer Mother    Hypertension Mother    Hypertension Father    Multiple sclerosis Sister    Past Surgical History:  Procedure Laterality Date   ANTERIOR CERVICAL DECOMP/DISCECTOMY FUSION  08/2011   [place]   BUNIONECTOMY Bilateral 1998   DEBRIDEMENT AND CLOSURE WOUND  12/2012   "back of my neck" (07/17/2013)   DEBRIDEMENT AND CLOSURE WOUND N/A 07/17/2013   Procedure: DEBRIDEMENT  OF SKIN, SUBCUTANEOUS TISSUE BONE,  MUSCLE FLAP, AND CLOSURE WOUND;  Surgeon: Glenna Fellows, MD;  Location: [facility] OR;  Service: Plastics;  Laterality: N/A;   EXCISIONAL HEMORRHOIDECTOMY  ~ 1996   INCISION AND DRAINAGE OF WOUND  10/2012 X 2   "back of my neck" (07/17/2013)   MUSCLE FLAP CLOSURE  07/17/2013   "to back of my neck" (07/17/2013)   NECK SURGERY  09/2012   "spur removed from back of my neck" (07/17/2013)   SHOULDER  ARTHROSCOPY Left 2008   SHOULDER ARTHROSCOPY W/ ROTATOR CUFF REPAIR Right 2013   "and bicep repair" (07/17/2013)   TONSILLECTOMY  ~ 1974   TOTAL KNEE ARTHROPLASTY Left 12/13/2020   Procedure: TOTAL KNEE ARTHROPLASTY;  Surgeon: Teryl Lucy, MD;  Location: [facility];  Service: Orthopedics;  Laterality: Left;   Social History   Social History Narrative   Not on file   Immunization History  Administered Date(s) Administered   Influenza,inj,Quad PF,6+ Mos 07/18/2013   Moderna Sars-Covid-2 Vaccination 10/31/2019, 11/28/2019, 07/22/2020   Pneumococcal Polysaccharide-23 07/18/2013   Tdap 11/28/2006, 04/30/2014     Objective: Vital Signs: There were no vitals taken for this visit.   Physical Exam Vitals and nursing note reviewed.  Constitutional:      Appearance: He is well-developed.  HENT:     Head: Normocephalic and atraumatic.  Eyes:     Conjunctiva/sclera: Conjunctivae normal.     Pupils: Pupils are equal, round, and reactive to light.  Pulmonary:     Effort: Pulmonary effort is normal.  Abdominal:     Palpations: Abdomen is soft.  Musculoskeletal:     Cervical back: Normal range of motion and neck supple.  Skin:    General: Skin is warm and dry.     Capillary Refill: Capillary refill takes less than 2 seconds.  Neurological:     Mental Status: He is alert and oriented to person, place, and time.  Psychiatric:        Behavior: Behavior normal.     Musculoskeletal Exam: ***  CDAI Exam: CDAI Score: -- Patient Global: --; Provider Global: -- Swollen: --; Tender: -- Joint Exam 07/07/2021   No joint exam has been documented for this visit   There is currently no information documented on the homunculus. Go to the Rheumatology activity and complete the homunculus joint exam.  Investigation: No additional findings.  Imaging: No results found.  Recent Labs: Lab Results  Component Value Date   WBC 5.2 12/14/2020   HGB 11.8 (L) 12/14/2020   PLT 171 12/14/2020    NA 134 (L) 12/14/2020   K 3.6 12/14/2020   CL 102 12/14/2020   CO2 23 12/14/2020   GLUCOSE 213 (H) 12/14/2020   BUN 18 12/14/2020   CREATININE 1.53 (H) 12/14/2020   BILITOT 0.7 12/17/2019   ALKPHOS 68 01/10/2017   AST 19 12/17/2019   ALT 19 12/17/2019   PROT 7.0 12/17/2019   ALBUMIN 4.6 01/10/2017   CALCIUM 8.6 (L) 12/14/2020   GFRAA 79 12/17/2019   QFTBGOLDPLUS NEGATIVE 11/28/2020    Speciality Comments: PLQ Eye Exam: 11/19/2018 WNL @ [facility] Follow up in 1 year  Procedures:  No procedures performed Allergies: Codeine, Daptomycin, Hydroxyzine, Latex, and Vancomycin   Assessment / Plan:     Visit Diagnoses: No diagnosis found.  Orders: No orders of the defined types were placed in this encounter.  No orders of the defined types were placed in this encounter.  Follow-Up Instructions: No follow-ups on file.   Gearldine Bienenstock, PA-C  Note - This record has been created using Dragon software.  Chart creation errors have been sought, but may not always  have been located. Such creation errors do not reflect on  the standard of medical care.  

## 2021-07-07 ENCOUNTER — Ambulatory Visit: Payer: Medicare Other | Admitting: Physician Assistant

## 2021-07-07 DIAGNOSIS — M5030 Other cervical disc degeneration, unspecified cervical region: Secondary | ICD-10-CM

## 2021-07-07 DIAGNOSIS — Z8679 Personal history of other diseases of the circulatory system: Secondary | ICD-10-CM

## 2021-07-07 DIAGNOSIS — Z87891 Personal history of nicotine dependence: Secondary | ICD-10-CM

## 2021-07-07 DIAGNOSIS — Z8659 Personal history of other mental and behavioral disorders: Secondary | ICD-10-CM

## 2021-07-07 DIAGNOSIS — M0579 Rheumatoid arthritis with rheumatoid factor of multiple sites without organ or systems involvement: Secondary | ICD-10-CM

## 2021-07-07 DIAGNOSIS — Z8669 Personal history of other diseases of the nervous system and sense organs: Secondary | ICD-10-CM

## 2021-07-07 DIAGNOSIS — M19071 Primary osteoarthritis, right ankle and foot: Secondary | ICD-10-CM

## 2021-07-07 DIAGNOSIS — Z820 Family history of epilepsy and other diseases of the nervous system: Secondary | ICD-10-CM

## 2021-07-07 DIAGNOSIS — M7541 Impingement syndrome of right shoulder: Secondary | ICD-10-CM

## 2021-07-07 DIAGNOSIS — M19042 Primary osteoarthritis, left hand: Secondary | ICD-10-CM

## 2021-07-07 DIAGNOSIS — Z79899 Other long term (current) drug therapy: Secondary | ICD-10-CM

## 2021-07-07 DIAGNOSIS — M170 Bilateral primary osteoarthritis of knee: Secondary | ICD-10-CM

## 2021-07-10 ENCOUNTER — Encounter: Payer: Self-pay | Admitting: Physician Assistant

## 2021-07-10 ENCOUNTER — Ambulatory Visit: Payer: Medicare Other | Admitting: Physician Assistant

## 2021-07-10 ENCOUNTER — Other Ambulatory Visit: Payer: Self-pay

## 2021-07-10 VITALS — BP 118/74 | HR 60 | Resp 16 | Ht 74.0 in | Wt 255.0 lb

## 2021-07-10 DIAGNOSIS — M0579 Rheumatoid arthritis with rheumatoid factor of multiple sites without organ or systems involvement: Secondary | ICD-10-CM | POA: Diagnosis not present

## 2021-07-10 DIAGNOSIS — Z8679 Personal history of other diseases of the circulatory system: Secondary | ICD-10-CM

## 2021-07-10 DIAGNOSIS — M19071 Primary osteoarthritis, right ankle and foot: Secondary | ICD-10-CM

## 2021-07-10 DIAGNOSIS — Z96652 Presence of left artificial knee joint: Secondary | ICD-10-CM

## 2021-07-10 DIAGNOSIS — M19042 Primary osteoarthritis, left hand: Secondary | ICD-10-CM

## 2021-07-10 DIAGNOSIS — M19041 Primary osteoarthritis, right hand: Secondary | ICD-10-CM | POA: Diagnosis not present

## 2021-07-10 DIAGNOSIS — M19072 Primary osteoarthritis, left ankle and foot: Secondary | ICD-10-CM

## 2021-07-10 DIAGNOSIS — Z79899 Other long term (current) drug therapy: Secondary | ICD-10-CM | POA: Diagnosis not present

## 2021-07-10 DIAGNOSIS — M1711 Unilateral primary osteoarthritis, right knee: Secondary | ICD-10-CM | POA: Diagnosis not present

## 2021-07-10 DIAGNOSIS — M7541 Impingement syndrome of right shoulder: Secondary | ICD-10-CM

## 2021-07-10 DIAGNOSIS — M5030 Other cervical disc degeneration, unspecified cervical region: Secondary | ICD-10-CM

## 2021-07-10 DIAGNOSIS — Z87891 Personal history of nicotine dependence: Secondary | ICD-10-CM

## 2021-07-10 DIAGNOSIS — Z820 Family history of epilepsy and other diseases of the nervous system: Secondary | ICD-10-CM

## 2021-07-10 DIAGNOSIS — Z8659 Personal history of other mental and behavioral disorders: Secondary | ICD-10-CM

## 2021-07-10 DIAGNOSIS — Z8669 Personal history of other diseases of the nervous system and sense organs: Secondary | ICD-10-CM

## 2021-07-10 NOTE — Progress Notes (Signed)
Office Visit Note  Patient: Dylan Dudley             Date of Birth: Feb 24, 1961           MRN: [id number]             PCP: Elise Benne, MD Referring: Elise Benne, MD Visit Date: 07/10/2021 Occupation: @GUAROCC @  Subjective:  Discuss medication options   History of Present Illness: EDU ON is a 60 y.o. male with history of seropositive rheumatoid arthritis, osteoarthritis, and DDD.   He is currently on orencia 125 mg sq injections once weekly.   He has not missed any doses recently.   He takes meloixam 15 mg 1 tablet by mouth daily every 2-3 days for pain relief. He had his left knee replaced by Dr. 67 on 12/13/20.  He has noticed a significant improvement in his left knee joint pain since having surgery.  He has started to increase his exercise plan regimen by walking on a daily basis.  He continues to have chronic pain in his right knee but is not ready to proceed with a right knee replacement at this time. He denies any recent infections.  He is planning on receiving annual influenza vaccination.      Activities of Daily Living:  Patient reports morning stiffness for 10 minutes.   Patient Denies nocturnal pain.  Difficulty dressing/grooming: Denies Difficulty climbing stairs: Reports Difficulty getting out of chair: Reports Difficulty using hands for taps, buttons, cutlery, and/or writing: Denies  Review of Systems  Constitutional:  Negative for fatigue.  HENT:  Negative for mouth dryness.   Eyes:  Negative for dryness.  Respiratory:  Negative for shortness of breath.   Cardiovascular:  Negative for swelling in legs/feet.  Gastrointestinal:  Negative for constipation.  Endocrine: Negative for excessive thirst.  Genitourinary:  Negative for difficulty urinating.  Musculoskeletal:  Positive for joint pain, joint pain, joint swelling, muscle weakness and morning stiffness.  Skin:  Negative for rash.  Allergic/Immunologic: Negative for  susceptible to infections.  Neurological:  Negative for numbness.  Hematological:  Negative for bruising/bleeding tendency.  Psychiatric/Behavioral:  Negative for sleep disturbance.    PMFS History:  Patient Active Problem List   Diagnosis Date Noted   S/P TKR (total knee replacement), left 12/13/2020   Osteoarthritis of left knee 11/28/2020   Rheumatoid arthritis with rheumatoid factor of multiple sites without organ or systems involvement (HCC) 09/10/2016   Primary osteoarthritis of both hands 09/10/2016   Primary osteoarthritis of both knees 09/10/2016   Primary osteoarthritis of both feet 09/10/2016   DJD (degenerative joint disease), cervical 09/10/2016   History of tremor 09/10/2016   History of sleep apnea 09/10/2016   Coronary artery disease involving native heart 09/10/2016   Smoker 09/10/2016   High risk medication use 09/10/2016   Tear of right rotator cuff 03/14/2012   Impingement syndrome of right shoulder 03/14/2012   AC (acromioclavicular) arthritis, right 03/14/2012   Disorder of tendon of biceps 03/14/2012    Past Medical History:  Diagnosis Date   AC (acromioclavicular) arthritis 03/14/2012   Anxiety    Arthritis    "shoulders; knees" (07/17/2013)   Chronic bronchitis (HCC)    Congestive heart failure (CHF) (HCC)    per patient    DDD (degenerative disc disease)    Depression    Disorder of tendon of biceps 03/14/2012   GERD (gastroesophageal reflux disease)    Hyperlipemia    Hypertension    Impingement  syndrome of right shoulder 03/14/2012   Pneumonia 2012   "once" (07/17/2013)   Rheumatoid arthritis (HCC)    Seasonal allergies    Sleep apnea    CPAP    Tear of right rotator cuff 03/14/2012    Family History  Problem Relation Age of Onset   Breast cancer Mother    Hypertension Mother    Hypertension Father    Multiple sclerosis Sister    Past Surgical History:  Procedure Laterality Date   ANTERIOR CERVICAL DECOMP/DISCECTOMY FUSION  08/2011    [place]   BUNIONECTOMY Bilateral 1998   DEBRIDEMENT AND CLOSURE WOUND  12/2012   "back of my neck" (07/17/2013)   DEBRIDEMENT AND CLOSURE WOUND N/A 07/17/2013   Procedure: DEBRIDEMENT  OF SKIN, SUBCUTANEOUS TISSUE BONE, MUSCLE FLAP, AND CLOSURE WOUND;  Surgeon: Glenna Fellows, MD;  Location: [facility];  Service: Plastics;  Laterality: N/A;   EXCISIONAL HEMORRHOIDECTOMY  ~ 1996   INCISION AND DRAINAGE OF WOUND  10/2012 X 2   "back of my neck" (07/17/2013)   MUSCLE FLAP CLOSURE  07/17/2013   "to back of my neck" (07/17/2013)   NECK SURGERY  09/2012   "spur removed from back of my neck" (07/17/2013)   SHOULDER ARTHROSCOPY Left 2008   SHOULDER ARTHROSCOPY W/ ROTATOR CUFF REPAIR Right 2013   "and bicep repair" (07/17/2013)   TONSILLECTOMY  ~ 1974   TOTAL KNEE ARTHROPLASTY Left 12/13/2020   Procedure: TOTAL KNEE ARTHROPLASTY;  Surgeon: Teryl Lucy, MD;  Location: [facility];  Service: Orthopedics;  Laterality: Left;   Social History   Social History Narrative   Not on file   Immunization History  Administered Date(s) Administered   Influenza,inj,Quad PF,6+ Mos 07/18/2013   Moderna Sars-Covid-2 Vaccination 10/31/2019, 11/28/2019, 07/22/2020   Pneumococcal Polysaccharide-23 07/18/2013   Tdap 11/28/2006, 04/30/2014     Objective: Vital Signs: BP 118/74 (BP Location: Right Arm, Patient Position: Sitting, Cuff Size: Normal)   Pulse 60   Resp 16   Ht 6\' 2"  (1.88 m)   Wt 255 lb (115.7 kg)   BMI 32.74 kg/m    Physical Exam Vitals and nursing note reviewed.  Constitutional:      Appearance: He is well-developed.  HENT:     Head: Normocephalic and atraumatic.  Eyes:     Conjunctiva/sclera: Conjunctivae normal.     Pupils: Pupils are equal, round, and reactive to light.  Pulmonary:     Effort: Pulmonary effort is normal.  Abdominal:     Palpations: Abdomen is soft.  Musculoskeletal:     Cervical back: Normal range of motion and neck supple.  Skin:    General: Skin is warm and  dry.     Capillary Refill: Capillary refill takes less than 2 seconds.  Neurological:     Mental Status: He is alert and oriented to person, place, and time.  Psychiatric:        Behavior: Behavior normal.     Musculoskeletal Exam: C-spine good ROM.  No midline spinal tenderness or SI joint tenderness.  Shoulder joints have good ROM with some stiffness in the right hip.  Elbow joints, wrist joints, MCPs, PIPs, and DIPs good ROM with no synovitis.  PIP and DIP thickening consistent with OA of both hands. Hip joints have good ROM with no discomfort.  Left knee replacement is warm with an effusion.  Right knee joint warmth and painful extension noted.  Ankle joints have good ROM with no tenderness or joint swelling.   CDAI Exam: CDAI  Score: 1  Patient Global: 5 mm; Provider Global: 5 mm Swollen: 0 ; Tender: 0  Joint Exam 07/10/2021   No joint exam has been documented for this visit   There is currently no information documented on the homunculus. Go to the Rheumatology activity and complete the homunculus joint exam.  Investigation: No additional findings.  Imaging: No results found.  Recent Labs: Lab Results  Component Value Date   WBC 5.2 12/14/2020   HGB 11.8 (L) 12/14/2020   PLT 171 12/14/2020   NA 134 (L) 12/14/2020   K 3.6 12/14/2020   CL 102 12/14/2020   CO2 23 12/14/2020   GLUCOSE 213 (H) 12/14/2020   BUN 18 12/14/2020   CREATININE 1.53 (H) 12/14/2020   BILITOT 0.7 12/17/2019   ALKPHOS 68 01/10/2017   AST 19 12/17/2019   ALT 19 12/17/2019   PROT 7.0 12/17/2019   ALBUMIN 4.6 01/10/2017   CALCIUM 8.6 (L) 12/14/2020   GFRAA 79 12/17/2019   QFTBGOLDPLUS NEGATIVE 11/28/2020    Speciality Comments: PLQ Eye Exam: 11/19/2018 WNL @ [facility] Follow up in 1 year  Procedures:  No procedures performed Allergies: Codeine, Daptomycin, Hydroxyzine, Latex, and Vancomycin   Assessment / Plan:     Visit Diagnoses: Rheumatoid arthritis with rheumatoid factor of  multiple sites without organ or systems involvement (HCC) -He has no joint tenderness or synovitis on examination today.  He has not had any signs or symptoms of a rheumatoid arthritis flare.  He remains on Orencia 125 mg subcutaneous injections once weekly.  He is unsure if Dub Amis is doing anything for his rheumatoid arthritis.  He has not noticed any joint swelling.  He continues to have chronic pain in the right knee joint but is not ready to proceed with a knee replacement at this time.  His left knee was replaced on 12/13/2020 by Dr. Dion Saucier which has provided significant pain relief.  Discussed that his rheumatoid arthritis does not appear to be active at this time so I feel that Dub Amis is controlling his disease.  We will check a sed rate today.  Discussed that the only true way to evaluate if Dub Amis is doing anything would be to space the dose or to discontinue which I do not recommend at this time.  He was advised to notify us if he develops increased joint pain or joint swelling.  He will follow-up in the office in 5 months.  Plan: Sedimentation rate  High risk medication use - Orencia 125 mg sq injections once weekly. TB gold negative on 11/28/20. CBC and CMP drawn on 04/05/21.  He is due to update lab work today.  Orders for CBC and CMP were released.  His next lab work will be due in January and every 3 months to monitor for drug toxicity.  Standing orders for CBC and CMP are in place.  - Plan: COMPLETE METABOLIC PANEL WITH GFR, CBC with Differential/Platelet He has not had any recent infections.  Discussed the importance of holding Orencia if he develops signs or symptoms of an infection and to resume once the infection has completely cleared.  He is planning on receiving the annual influenza vaccine.   Primary osteoarthritis of both hands: He has PIP and DIP thickening consistent with OA of both hands.  He was able to make a complete fist bilaterally.  No tenderness or inflammation was noted.   Discussed the importance of joint protection and muscle strengthening.  Primary osteoarthritis of right knee: Chronic pain.  He has painful ROM with crepitus. Warmth but no effusion noted.  He is not ready to proceed with a knee replacement at this time.  S/P TKR (total knee replacement), left: Performed by Dr. Dion Saucier on 12/13/2020.  He has noticed a significant improvement in his pain and stiffness in the left knee.  On examination today he has almost full extension with warmth and effusion.  He has been increasing his exercise regimen and has been walking on a daily basis.  He applies ice to his left knee after walking to help with the swelling.  He will continue to follow-up with Dr. Dion Saucier as directed.  Primary osteoarthritis of both feet: He is not having any discomfort in his feet at this time.  He has good range of motion of both ankle joints with no tenderness or joint swelling.  He is wearing proper fitting shoes.  Impingement syndrome of right shoulder: He has good ROM of the right shoulder with some stiffness.  No tenderness upon palpation.   DDD (degenerative disc disease), cervical: He has good ROM of the C-spine.  No symptoms of radiculopathy.   Other medical conditions are listed as follows:   Family history of multiple sclerosis  History of depression  History of tremor  History of sleep apnea  History of CHF (congestive heart failure)  History of coronary artery disease  Former smoker  Orders: Orders Placed This Encounter  Procedures   COMPLETE METABOLIC PANEL WITH GFR   CBC with Differential/Platelet   Sedimentation rate   No orders of the defined types were placed in this encounter.     Follow-Up Instructions: Return in about 5 months (around 12/08/2021) for Rheumatoid arthritis, Osteoarthritis, DDD.   Gearldine Bienenstock, PA-C  Note - This record has been created using Dragon software.  Chart creation errors have been sought, but may not always  have been  located. Such creation errors do not reflect on  the standard of medical care.

## 2021-07-11 ENCOUNTER — Telehealth: Payer: Self-pay | Admitting: *Deleted

## 2021-07-11 ENCOUNTER — Other Ambulatory Visit (HOSPITAL_COMMUNITY): Payer: Self-pay

## 2021-07-11 DIAGNOSIS — Z79899 Other long term (current) drug therapy: Secondary | ICD-10-CM

## 2021-07-11 LAB — CBC WITH DIFFERENTIAL/PLATELET
Absolute Monocytes: 338 cells/uL (ref 200–950)
Basophils Absolute: 40 cells/uL (ref 0–200)
Basophils Relative: 1.1 %
Eosinophils Absolute: 130 cells/uL (ref 15–500)
Eosinophils Relative: 3.6 %
HCT: 41.7 % (ref 38.5–50.0)
Hemoglobin: 13.8 g/dL (ref 13.2–17.1)
Lymphs Abs: 1138 cells/uL (ref 850–3900)
MCH: 31.9 pg (ref 27.0–33.0)
MCHC: 33.1 g/dL (ref 32.0–36.0)
MCV: 96.5 fL (ref 80.0–100.0)
MPV: 9.8 fL (ref 7.5–12.5)
Monocytes Relative: 9.4 %
Neutro Abs: 1955 cells/uL (ref 1500–7800)
Neutrophils Relative %: 54.3 %
Platelets: 258 10*3/uL (ref 140–400)
RBC: 4.32 10*6/uL (ref 4.20–5.80)
RDW: 13.6 % (ref 11.0–15.0)
Total Lymphocyte: 31.6 %
WBC: 3.6 10*3/uL — ABNORMAL LOW (ref 3.8–10.8)

## 2021-07-11 LAB — COMPLETE METABOLIC PANEL WITH GFR
AG Ratio: 1.5 (calc) (ref 1.0–2.5)
ALT: 22 U/L (ref 9–46)
AST: 23 U/L (ref 10–35)
Albumin: 4.4 g/dL (ref 3.6–5.1)
Alkaline phosphatase (APISO): 58 U/L (ref 35–144)
BUN/Creatinine Ratio: 19 (calc) (ref 6–22)
BUN: 30 mg/dL — ABNORMAL HIGH (ref 7–25)
CO2: 26 mmol/L (ref 20–32)
Calcium: 9.4 mg/dL (ref 8.6–10.3)
Chloride: 103 mmol/L (ref 98–110)
Creat: 1.59 mg/dL — ABNORMAL HIGH (ref 0.70–1.35)
Globulin: 2.9 g/dL (calc) (ref 1.9–3.7)
Glucose, Bld: 86 mg/dL (ref 65–99)
Potassium: 4.1 mmol/L (ref 3.5–5.3)
Sodium: 141 mmol/L (ref 135–146)
Total Bilirubin: 0.5 mg/dL (ref 0.2–1.2)
Total Protein: 7.3 g/dL (ref 6.1–8.1)
eGFR: 49 mL/min/{1.73_m2} — ABNORMAL LOW (ref >=60)

## 2021-07-11 LAB — SEDIMENTATION RATE: Sed Rate: 14 mm/h (ref 0–20)

## 2021-07-11 NOTE — Progress Notes (Signed)
Creatinine remains elevated and continues to trend up-1.59, BUN elevated, and GFR is low-49. Please forward lab results to the patients nephrologist.  Rest of CMP WNL. WBC count is slightly low-3.6.  ESR WNL.   Please advise the patient to return in 1 month to recheck CBC with diff.

## 2021-07-11 NOTE — Telephone Encounter (Signed)
-----  Message from Ofilia Neas, PA-C sent at 07/11/2021  4:24 PM EDT ----- Creatinine remains elevated and continues to trend up-1.59, BUN elevated, and GFR is low-49. Please forward lab results to the patients nephrologist.  Rest of CMP WNL. WBC count is slightly low-3.6.  ESR WNL.   Please advise the patient to return in 1 month to recheck CBC with diff.

## 2021-08-02 ENCOUNTER — Other Ambulatory Visit (HOSPITAL_COMMUNITY): Payer: Self-pay

## 2021-08-09 ENCOUNTER — Other Ambulatory Visit (HOSPITAL_COMMUNITY): Payer: Self-pay

## 2021-09-04 ENCOUNTER — Other Ambulatory Visit (HOSPITAL_COMMUNITY): Payer: Self-pay

## 2021-09-05 ENCOUNTER — Other Ambulatory Visit (HOSPITAL_COMMUNITY): Payer: Self-pay

## 2021-09-07 ENCOUNTER — Other Ambulatory Visit (HOSPITAL_COMMUNITY): Payer: Self-pay

## 2021-10-03 ENCOUNTER — Other Ambulatory Visit: Payer: Self-pay | Admitting: Rheumatology

## 2021-10-03 ENCOUNTER — Other Ambulatory Visit (HOSPITAL_COMMUNITY): Payer: Self-pay

## 2021-10-03 DIAGNOSIS — Z79899 Other long term (current) drug therapy: Secondary | ICD-10-CM

## 2021-10-03 MED ORDER — ORENCIA CLICKJECT 125 MG/ML ~~LOC~~ SOAJ
125.0000 mg | SUBCUTANEOUS | 0 refills | Status: DC
Start: 2021-10-03 — End: 2021-12-25
  Filled 2021-10-03: qty 4, 28d supply, fill #0
  Filled 2021-10-30: qty 4, 28d supply, fill #1
  Filled 2021-11-24: qty 4, 28d supply, fill #2

## 2021-10-03 NOTE — Telephone Encounter (Signed)
Next Visit: 12/11/2021  Last Visit: 07/10/2021  Last Fill: 07/04/2021  ZO:[id number]:Rheumatoid arthritis with rheumatoid factor of multiple sites without organ or systems involvement   Current Dose per office note 07/10/2021: Orencia 125 mg sq injections once weekly  Labs: 07/10/2021 Creatinine remains elevated and continues to trend up-1.59, BUN elevated, and GFR is low-49. Rest of CMP WNL. WBC count is slightly low-3.6.  TB Gold: 11/28/2020 Neg    Patient advised he is due to update labs this month.   Okay to refill Orencia?

## 2021-10-05 ENCOUNTER — Other Ambulatory Visit (HOSPITAL_COMMUNITY): Payer: Self-pay

## 2021-10-06 ENCOUNTER — Other Ambulatory Visit: Payer: Self-pay

## 2021-10-06 ENCOUNTER — Telehealth: Payer: Self-pay | Admitting: Rheumatology

## 2021-10-06 DIAGNOSIS — Z79899 Other long term (current) drug therapy: Secondary | ICD-10-CM

## 2021-10-06 NOTE — Telephone Encounter (Signed)
Patient called the office stating he is at [facility] to get labs and states they don't have his lab orders from us. Informed patient that we are short staffed today so the orders may be delayed.

## 2021-10-06 NOTE — Telephone Encounter (Signed)
Spoke with patient and he is at his PCP's office to have labs drawn. I have faxed the lab orders to PCP's office.

## 2021-10-10 ENCOUNTER — Telehealth: Payer: Self-pay | Admitting: *Deleted

## 2021-10-10 NOTE — Telephone Encounter (Signed)
Labs received from:Dr. Shon Millet  Drawn on: 10/02/2021  Reviewed by:Hazel Sams, PA-C  Labs drawn:CBC, CMP, Uric Acid,Cholesterol Panel   Results:WBC 3.8  RBC 4.17  Hgb 13.5  ESR 26  Glucose 117 Creat. 1.5  GFR 53 Uric Acid 7.9  Patient on Orencia 125 mg SQ weekly.  Per Lovena Le, WBC 3.6 on 07/10/2021, WBC improving , Hgb 13.8 stable. Avoid use of Meloxicam. We will monitor lab work closely.   Patient advised.

## 2021-10-30 ENCOUNTER — Other Ambulatory Visit (HOSPITAL_COMMUNITY): Payer: Self-pay

## 2021-10-31 ENCOUNTER — Other Ambulatory Visit (HOSPITAL_COMMUNITY): Payer: Self-pay

## 2021-11-24 ENCOUNTER — Other Ambulatory Visit (HOSPITAL_COMMUNITY): Payer: Self-pay

## 2021-11-27 NOTE — Progress Notes (Unsigned)
Office Visit Note  Patient: Dylan Dudley             Date of Birth: [birth year]           MRN: [id number]             PCP: Elise Benne, MD Referring: Elise Benne, MD Visit Date: 12/11/2021 Occupation: @GUAROCC @  Subjective:  No chief complaint on file.   History of Present Illness: Dylan Dudley is a 61 y.o. male ***   Activities of Daily Living:  Patient reports morning stiffness for *** {minute/hour:19697}.   Patient {ACTIONS;DENIES/REPORTS:21021675::"Denies"} nocturnal pain.  Difficulty dressing/grooming: {ACTIONS;DENIES/REPORTS:21021675::"Denies"} Difficulty climbing stairs: {ACTIONS;DENIES/REPORTS:21021675::"Denies"} Difficulty getting out of chair: {ACTIONS;DENIES/REPORTS:21021675::"Denies"} Difficulty using hands for taps, buttons, cutlery, and/or writing: {ACTIONS;DENIES/REPORTS:21021675::"Denies"}  No Rheumatology ROS completed.   PMFS History:  Patient Active Problem List   Diagnosis Date Noted   S/P TKR (total knee replacement), left 12/13/2020   Osteoarthritis of left knee 11/28/2020   Rheumatoid arthritis with rheumatoid factor of multiple sites without organ or systems involvement (HCC) 09/10/2016   Primary osteoarthritis of both hands 09/10/2016   Primary osteoarthritis of both knees 09/10/2016   Primary osteoarthritis of both feet 09/10/2016   DJD (degenerative joint disease), cervical 09/10/2016   History of tremor 09/10/2016   History of sleep apnea 09/10/2016   Coronary artery disease involving native heart 09/10/2016   Smoker 09/10/2016   High risk medication use 09/10/2016   Tear of right rotator cuff 03/14/2012   Impingement syndrome of right shoulder 03/14/2012   AC (acromioclavicular) arthritis, right 03/14/2012   Disorder of tendon of biceps 03/14/2012    Past Medical History:  Diagnosis Date   AC (acromioclavicular) arthritis 03/14/2012   Anxiety    Arthritis    "shoulders; knees" (07/17/2013)   Chronic bronchitis  (HCC)    Congestive heart failure (CHF) (HCC)    per patient    DDD (degenerative disc disease)    Depression    Disorder of tendon of biceps 03/14/2012   GERD (gastroesophageal reflux disease)    Hyperlipemia    Hypertension    Impingement syndrome of right shoulder 03/14/2012   Pneumonia 2012   "once" (07/17/2013)   Rheumatoid arthritis (HCC)    Seasonal allergies    Sleep apnea    CPAP    Tear of right rotator cuff 03/14/2012    Family History  Problem Relation Age of Onset   Breast cancer Mother    Hypertension Mother    Hypertension Father    Multiple sclerosis Sister    Past Surgical History:  Procedure Laterality Date   ANTERIOR CERVICAL DECOMP/DISCECTOMY FUSION  08/2011   [place]   BUNIONECTOMY Bilateral 1998   DEBRIDEMENT AND CLOSURE WOUND  12/2012   "back of my neck" (07/17/2013)   DEBRIDEMENT AND CLOSURE WOUND N/A 07/17/2013   Procedure: DEBRIDEMENT  OF SKIN, SUBCUTANEOUS TISSUE BONE, MUSCLE FLAP, AND CLOSURE WOUND;  Surgeon: Glenna Fellows, MD;  Location: [facility] OR;  Service: Plastics;  Laterality: N/A;   EXCISIONAL HEMORRHOIDECTOMY  ~ 1996   INCISION AND DRAINAGE OF WOUND  10/2012 X 2   "back of my neck" (07/17/2013)   MUSCLE FLAP CLOSURE  07/17/2013   "to back of my neck" (07/17/2013)   NECK SURGERY  09/2012   "spur removed from back of my neck" (07/17/2013)   SHOULDER ARTHROSCOPY Left 2008   SHOULDER ARTHROSCOPY W/ ROTATOR CUFF REPAIR Right 2013   "and bicep repair" (07/17/2013)   TONSILLECTOMY  ~ 1974  TOTAL KNEE ARTHROPLASTY Left 12/13/2020   Procedure: TOTAL KNEE ARTHROPLASTY;  Surgeon: Teryl LucyLandau, Joshua, MD;  Location: [facility];  Service: Orthopedics;  Laterality: Left;   Social History   Social History Narrative   Not on file   Immunization History  Administered Date(s) Administered   Influenza,inj,Quad PF,6+ Mos 07/18/2013   Moderna Sars-Covid-2 Vaccination 10/31/2019, 11/28/2019, 07/22/2020   Pneumococcal Polysaccharide-23 07/18/2013   Tdap  11/28/2006, 04/30/2014     Objective: Vital Signs: There were no vitals taken for this visit.   Physical Exam   Musculoskeletal Exam: ***  CDAI Exam: CDAI Score: -- Patient Global: --; Provider Global: -- Swollen: --; Tender: -- Joint Exam 12/11/2021   No joint exam has been documented for this visit   There is currently no information documented on the homunculus. Go to the Rheumatology activity and complete the homunculus joint exam.  Investigation: No additional findings.  Imaging: No results found.  Recent Labs: Lab Results  Component Value Date   WBC 3.6 (L) 07/10/2021   HGB 13.8 07/10/2021   PLT 258 07/10/2021   NA 141 07/10/2021   K 4.1 07/10/2021   CL 103 07/10/2021   CO2 26 07/10/2021   GLUCOSE 86 07/10/2021   BUN 30 (H) 07/10/2021   CREATININE 1.59 (H) 07/10/2021   BILITOT 0.5 07/10/2021   ALKPHOS 68 01/10/2017   AST 23 07/10/2021   ALT 22 07/10/2021   PROT 7.3 07/10/2021   ALBUMIN 4.6 01/10/2017   CALCIUM 9.4 07/10/2021   GFRAA 79 12/17/2019   QFTBGOLDPLUS NEGATIVE 11/28/2020    Speciality Comments: PLQ Eye Exam: 11/19/2018 WNL @ [facility] Follow up in 1 year  Procedures:  No procedures performed Allergies: Codeine, Daptomycin, Hydroxyzine, Latex, and Vancomycin   Assessment / Plan:     Visit Diagnoses: No diagnosis found.  Orders: No orders of the defined types were placed in this encounter.  No orders of the defined types were placed in this encounter.   Face-to-face time spent with patient was *** minutes. Greater than 50% of time was spent in counseling and coordination of care.  Follow-Up Instructions: No follow-ups on file.   Ellen HenriMarissa C Kord Monette, CMA  Note - This record has been created using Animal nutritionistDragon software.  Chart creation errors have been sought, but may not always  have been located. Such creation errors do not reflect on  the standard of medical care.

## 2021-11-30 ENCOUNTER — Other Ambulatory Visit (HOSPITAL_COMMUNITY): Payer: Self-pay

## 2021-11-30 ENCOUNTER — Telehealth: Payer: Self-pay

## 2021-11-30 NOTE — Telephone Encounter (Signed)
Received notification from [facility] that medication requires a new PA. ? ?Submitted an URGENT Prior Authorization request to [facility] for [facility] via CoverMyMeds. Will update once we receive a response. ? ? ?Key: [id number] ?

## 2021-11-30 NOTE — Telephone Encounter (Signed)
Received notification from [facility] regarding a prior authorization for [facility]. Authorization has been APPROVED from 09/24/2021 to 11/30/2022. Approval letter sent to scan center. Updated [employer] with current Prior Authorization information. ? ?Authorization # [id number] ?

## 2021-12-11 ENCOUNTER — Ambulatory Visit (INDEPENDENT_AMBULATORY_CARE_PROVIDER_SITE_OTHER): Payer: Medicare Other | Admitting: Physician Assistant

## 2021-12-11 ENCOUNTER — Ambulatory Visit: Payer: Medicare Other | Admitting: Rheumatology

## 2021-12-11 ENCOUNTER — Other Ambulatory Visit: Payer: Self-pay

## 2021-12-11 ENCOUNTER — Encounter: Payer: Self-pay | Admitting: Physician Assistant

## 2021-12-11 VITALS — BP 124/80 | HR 53 | Ht 72.0 in | Wt 270.2 lb

## 2021-12-11 DIAGNOSIS — M19041 Primary osteoarthritis, right hand: Secondary | ICD-10-CM

## 2021-12-11 DIAGNOSIS — Z111 Encounter for screening for respiratory tuberculosis: Secondary | ICD-10-CM

## 2021-12-11 DIAGNOSIS — Z8679 Personal history of other diseases of the circulatory system: Secondary | ICD-10-CM

## 2021-12-11 DIAGNOSIS — Z820 Family history of epilepsy and other diseases of the nervous system: Secondary | ICD-10-CM

## 2021-12-11 DIAGNOSIS — M1711 Unilateral primary osteoarthritis, right knee: Secondary | ICD-10-CM

## 2021-12-11 DIAGNOSIS — Z79899 Other long term (current) drug therapy: Secondary | ICD-10-CM

## 2021-12-11 DIAGNOSIS — M0579 Rheumatoid arthritis with rheumatoid factor of multiple sites without organ or systems involvement: Secondary | ICD-10-CM

## 2021-12-11 DIAGNOSIS — M5030 Other cervical disc degeneration, unspecified cervical region: Secondary | ICD-10-CM

## 2021-12-11 DIAGNOSIS — Z8659 Personal history of other mental and behavioral disorders: Secondary | ICD-10-CM

## 2021-12-11 DIAGNOSIS — M19072 Primary osteoarthritis, left ankle and foot: Secondary | ICD-10-CM

## 2021-12-11 DIAGNOSIS — M19071 Primary osteoarthritis, right ankle and foot: Secondary | ICD-10-CM

## 2021-12-11 DIAGNOSIS — Z96652 Presence of left artificial knee joint: Secondary | ICD-10-CM

## 2021-12-11 DIAGNOSIS — Z87891 Personal history of nicotine dependence: Secondary | ICD-10-CM

## 2021-12-11 DIAGNOSIS — M7541 Impingement syndrome of right shoulder: Secondary | ICD-10-CM

## 2021-12-11 DIAGNOSIS — M19042 Primary osteoarthritis, left hand: Secondary | ICD-10-CM

## 2021-12-11 DIAGNOSIS — Z8669 Personal history of other diseases of the nervous system and sense organs: Secondary | ICD-10-CM

## 2021-12-11 NOTE — Patient Instructions (Signed)
Standing Labs ?We placed an order today for your standing lab work.  ? ?Please have your standing labs drawn in June and every 3 months  ? ?If possible, please have your labs drawn 2 weeks prior to your appointment so that the provider can discuss your results at your appointment. ? ?Please note that you may see your imaging and lab results in MyChart before we have reviewed them. ?We may be awaiting multiple results to interpret others before contacting you. ?Please allow our office up to 72 hours to thoroughly review all of the results before contacting the office for clarification of your results. ? ?We have open lab daily: ?Monday through Thursday from 1:30-4:30 PM and Friday from 1:30-4:00 PM ?at the office of Dr. Shaili Deveshwar, [facility].   ?Please be advised, all patients with office appointments requiring lab work will take precedent over walk-in lab work.  ?If possible, please come for your lab work on Monday and Friday afternoons, as you may experience shorter wait times. ?The office is located at [address], [address], [place], [place] [address] ?No appointment is necessary.   ?Labs are drawn by [facility]. Please bring your co-pay at the time of your lab draw.  You may receive a bill from [facility] for your lab work. ? ?Please note if you are on Hydroxychloroquine and and an order has been placed for a Hydroxychloroquine level, you will need to have it drawn 4 hours or more after your last dose. ? ?If you wish to have your labs drawn at another location, please call the office 24 hours in advance to send orders. ? ?If you have any questions regarding directions or hours of operation,  ?please call [phone].   ?As a reminder, please drink plenty of water prior to coming for your lab work. Thanks! ? ?

## 2021-12-12 NOTE — Progress Notes (Signed)
CBC WNL.  Creatinine remains elevated-1.77 and GFR is low 43. Please discourage the use of all NSAIDs-including meloxicam listed on his med list.  Tylenol will be a safer options.  Rest of CMP WNL.

## 2021-12-14 LAB — CBC WITH DIFFERENTIAL/PLATELET
Absolute Monocytes: 404 cells/uL (ref 200–950)
Basophils Absolute: 40 cells/uL (ref 0–200)
Basophils Relative: 1 %
Eosinophils Absolute: 120 cells/uL (ref 15–500)
Eosinophils Relative: 3 %
HCT: 40.6 % (ref 38.5–50.0)
Hemoglobin: 13.5 g/dL (ref 13.2–17.1)
Lymphs Abs: 992 cells/uL (ref 850–3900)
MCH: 31.8 pg (ref 27.0–33.0)
MCHC: 33.3 g/dL (ref 32.0–36.0)
MCV: 95.8 fL (ref 80.0–100.0)
MPV: 10.4 fL (ref 7.5–12.5)
Monocytes Relative: 10.1 %
Neutro Abs: 2444 cells/uL (ref 1500–7800)
Neutrophils Relative %: 61.1 %
Platelets: 216 10*3/uL (ref 140–400)
RBC: 4.24 10*6/uL (ref 4.20–5.80)
RDW: 13.6 % (ref 11.0–15.0)
Total Lymphocyte: 24.8 %
WBC: 4 10*3/uL (ref 3.8–10.8)

## 2021-12-14 LAB — COMPLETE METABOLIC PANEL WITH GFR
AG Ratio: 1.6 (calc) (ref 1.0–2.5)
ALT: 18 U/L (ref 9–46)
AST: 19 U/L (ref 10–35)
Albumin: 4.2 g/dL (ref 3.6–5.1)
Alkaline phosphatase (APISO): 48 U/L (ref 35–144)
BUN/Creatinine Ratio: 16 (calc) (ref 6–22)
BUN: 28 mg/dL — ABNORMAL HIGH (ref 7–25)
CO2: 27 mmol/L (ref 20–32)
Calcium: 9.8 mg/dL (ref 8.6–10.3)
Chloride: 102 mmol/L (ref 98–110)
Creat: 1.77 mg/dL — ABNORMAL HIGH (ref 0.70–1.35)
Globulin: 2.7 g/dL (calc) (ref 1.9–3.7)
Glucose, Bld: 104 mg/dL — ABNORMAL HIGH (ref 65–99)
Potassium: 4.2 mmol/L (ref 3.5–5.3)
Sodium: 138 mmol/L (ref 135–146)
Total Bilirubin: 0.5 mg/dL (ref 0.2–1.2)
Total Protein: 6.9 g/dL (ref 6.1–8.1)
eGFR: 43 mL/min/{1.73_m2} — ABNORMAL LOW (ref >=60)

## 2021-12-14 LAB — QUANTIFERON-TB GOLD PLUS
Mitogen-NIL: 7.97 IU/mL
NIL: 0.03 IU/mL
QuantiFERON-TB Gold Plus: NEGATIVE
TB1-NIL: 0 IU/mL
TB2-NIL: 0 IU/mL

## 2021-12-15 NOTE — Progress Notes (Signed)
TB gold negative

## 2021-12-25 ENCOUNTER — Other Ambulatory Visit: Payer: Self-pay | Admitting: Physician Assistant

## 2021-12-25 ENCOUNTER — Other Ambulatory Visit (HOSPITAL_COMMUNITY): Payer: Self-pay

## 2021-12-25 MED ORDER — ORENCIA CLICKJECT 125 MG/ML ~~LOC~~ SOAJ
125.0000 mg | SUBCUTANEOUS | 0 refills | Status: DC
  Filled 2021-12-25: qty 12, 84d supply, fill #0

## 2021-12-25 NOTE — Telephone Encounter (Signed)
Next Visit: 05/15/2022 ? ?Last Visit: 12/11/2021 ? ?Last Fill: 10/03/2021 ? ?UP:[id number] arthritis with rheumatoid factor of multiple sites without organ or systems involvement  ? ?Current Dose per office note 12/11/2021: Orencia 125 mg sq injections once weekly ? ?Labs: 12/11/2021 CBC WNL.  Creatinine remains elevated-1.77 and GFR is low 43.  Rest of CMP WNL.  ? ?TB Gold: 12/11/2021 Neg   ? ?Okay to refill Orencia?  ?

## 2021-12-27 ENCOUNTER — Other Ambulatory Visit (HOSPITAL_COMMUNITY): Payer: Self-pay

## 2022-01-18 ENCOUNTER — Other Ambulatory Visit (HOSPITAL_COMMUNITY): Payer: Self-pay

## 2022-02-09 IMAGING — DX DG KNEE 1-2V PORT*L*
2 series · 2 of 2 positions shown · non-contrast
Comparison: 09/30/2020 outside radiographs.

CLINICAL DATA: Status post total knee replacement.

EXAM:
PORTABLE LEFT KNEE - 1-2 VIEW

[knee ap]
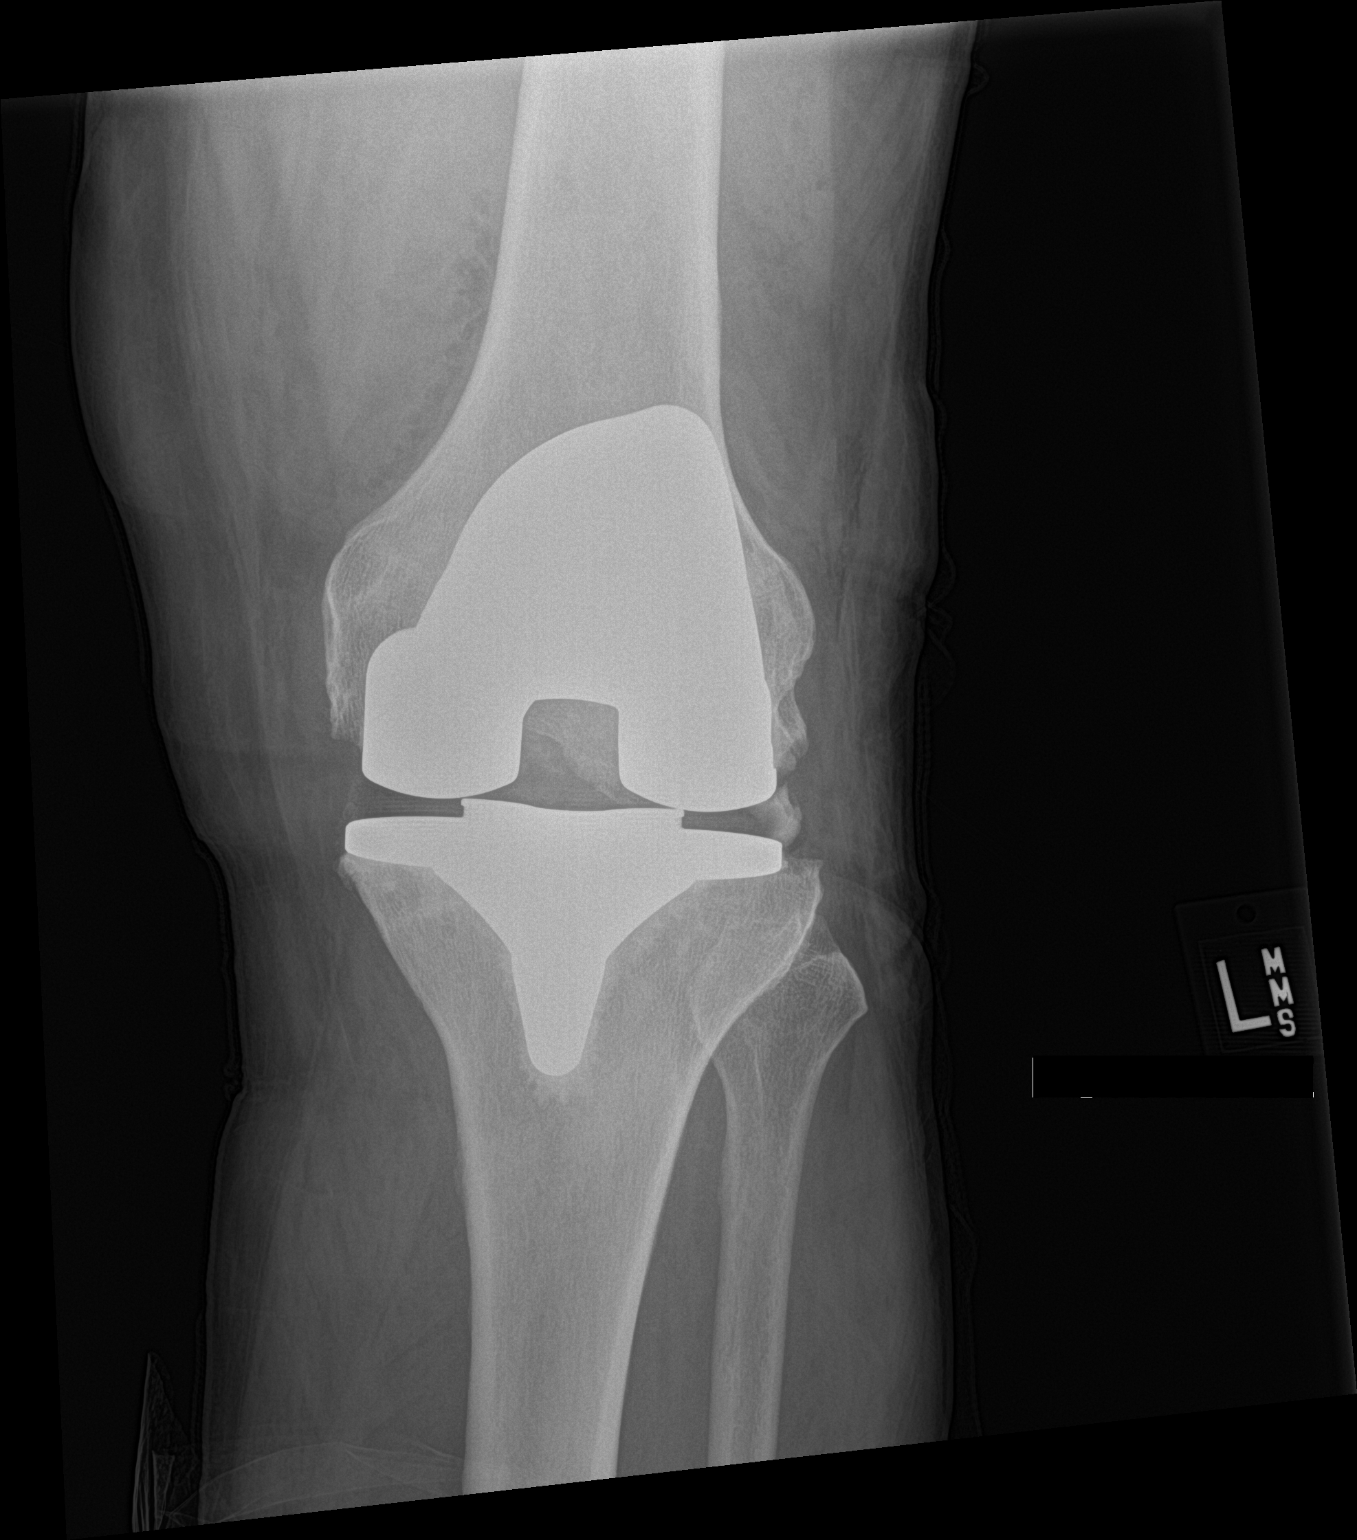

[knee lat]
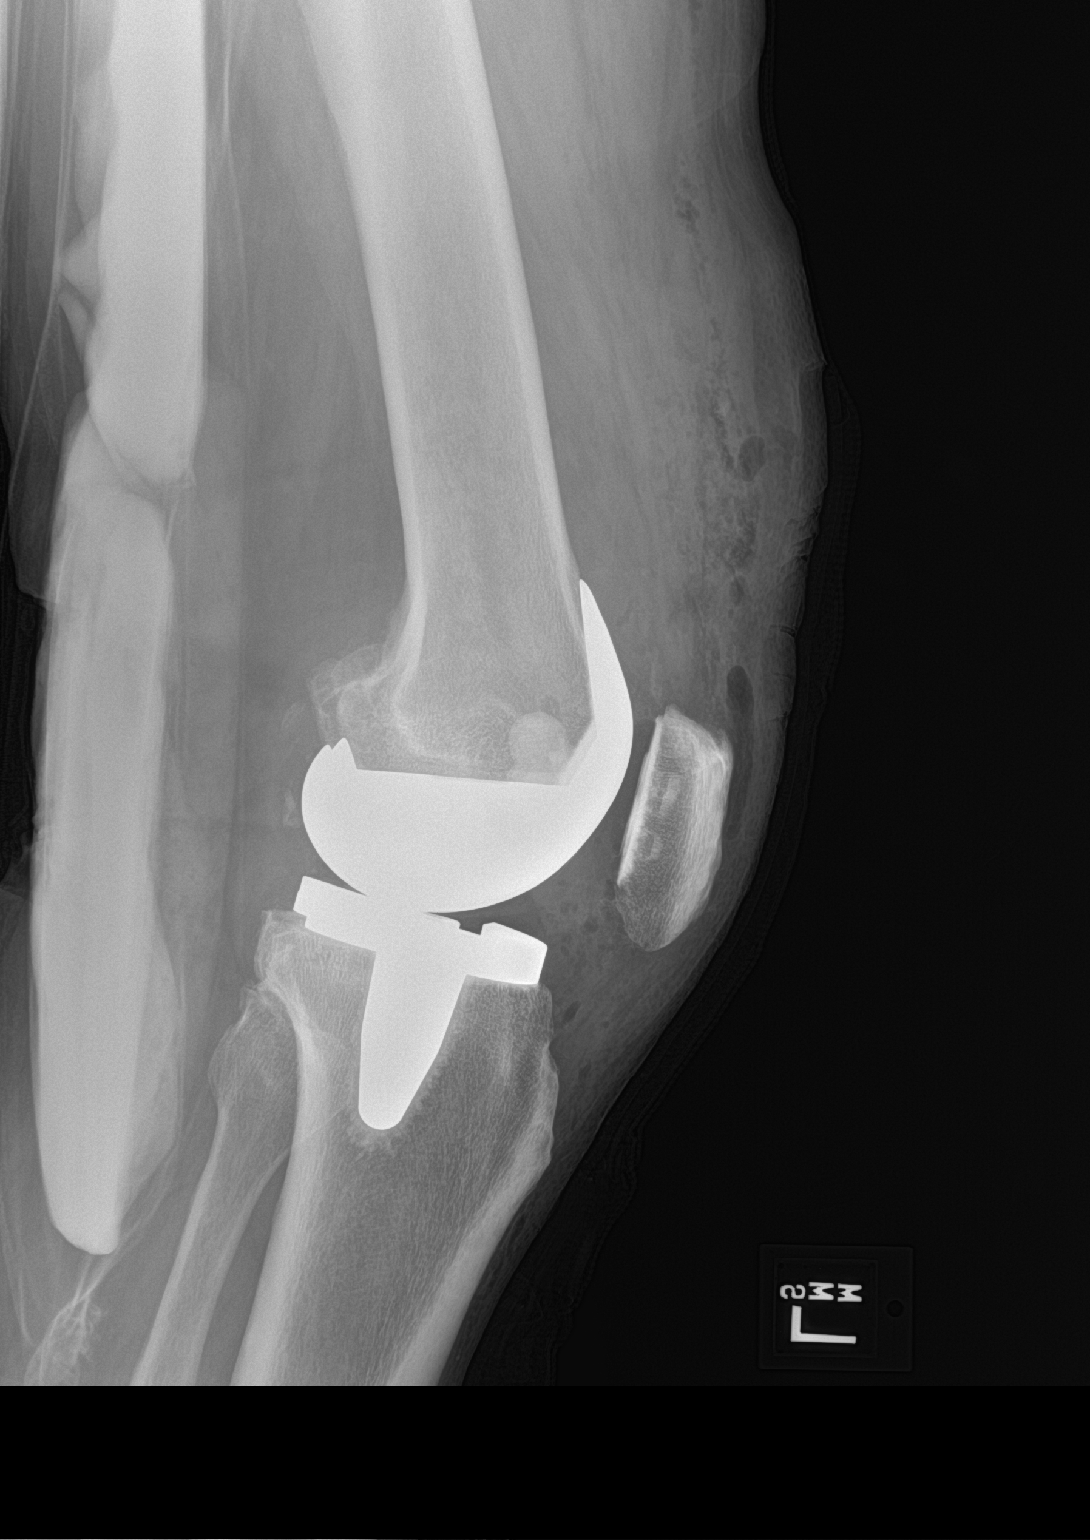

[2 of 2 positions shown; findings below may reference images not displayed]

FINDINGS: Total left knee arthroplasty. Expected anterior edema and gas. No
complicating fracture.
IMPRESSION: Expected appearance after left knee arthroplasty.

## 2022-03-08 ENCOUNTER — Other Ambulatory Visit (HOSPITAL_COMMUNITY): Payer: Self-pay

## 2022-03-12 ENCOUNTER — Other Ambulatory Visit: Payer: Self-pay | Admitting: Physician Assistant

## 2022-03-12 ENCOUNTER — Other Ambulatory Visit (HOSPITAL_COMMUNITY): Payer: Self-pay

## 2022-03-12 MED ORDER — ORENCIA CLICKJECT 125 MG/ML ~~LOC~~ SOAJ
125.0000 mg | SUBCUTANEOUS | 0 refills | Status: DC
  Filled 2022-03-13: qty 4, 28d supply, fill #0
  Filled 2022-04-16: qty 4, 28d supply, fill #1

## 2022-03-12 NOTE — Telephone Encounter (Signed)
Next Visit: 05/15/2022  Last Visit: 12/11/2021  Last Fill: 12/25/2021  DX: Rheumatoid arthritis with rheumatoid factor of multiple sites without organ or systems involvement   Current Dose per office note 12/11/2021: Orencia 125 mg sq injections once weekly.   Labs: 12/11/2021, CBC WNL.  Creatinine remains elevated-1.77 and GFR is low 43. Please discourage the use of all NSAIDs-including meloxicam listed on his med list.  Tylenol will be a safer options.  Rest of CMP WNL.   TB Gold: 12/11/2021 TB gold negative   Okay to refill Orencia?

## 2022-03-13 ENCOUNTER — Other Ambulatory Visit (HOSPITAL_COMMUNITY): Payer: Self-pay

## 2022-03-22 ENCOUNTER — Other Ambulatory Visit (HOSPITAL_COMMUNITY): Payer: Self-pay

## 2022-04-16 ENCOUNTER — Other Ambulatory Visit (HOSPITAL_COMMUNITY): Payer: Self-pay

## 2022-04-23 ENCOUNTER — Other Ambulatory Visit (HOSPITAL_COMMUNITY): Payer: Self-pay

## 2022-04-23 ENCOUNTER — Other Ambulatory Visit: Payer: Self-pay | Admitting: Pharmacist

## 2022-04-23 DIAGNOSIS — Z79899 Other long term (current) drug therapy: Secondary | ICD-10-CM

## 2022-04-23 DIAGNOSIS — M0579 Rheumatoid arthritis with rheumatoid factor of multiple sites without organ or systems involvement: Secondary | ICD-10-CM

## 2022-04-23 MED ORDER — ORENCIA CLICKJECT 125 MG/ML ~~LOC~~ SOAJ
125.0000 mg | SUBCUTANEOUS | 1 refills | Status: DC
  Filled 2022-04-23: qty 4, 28d supply, fill #0
  Filled 2022-05-18: qty 4, 28d supply, fill #1

## 2022-04-23 NOTE — Telephone Encounter (Signed)
Rx for Orencia 125mg every 7 days sent to [facility] for hospital-based cost pricing.   Total qty remaining is 8ml    Ivery Nanney, PharmD, MPH, BCPS, CPP Clinical Pharmacist (Rheumatology and Pulmonology) 

## 2022-04-24 ENCOUNTER — Other Ambulatory Visit (HOSPITAL_COMMUNITY): Payer: Self-pay

## 2022-05-01 NOTE — Progress Notes (Signed)
Office Visit Note  Patient: Dylan Dudley             Date of Birth: 05-11-1961           MRN: [id number]             PCP: Elise Benne, MD Referring: Elise Benne, MD Visit Date: 05/15/2022 Occupation: @  Subjective:  Medication management  History of Present Illness: Dylan Dudley is a 61 y.o. male 3 of seropositive rheumatoid arthritis, osteoarthritis and degenerative disc disease.  He denies any pain and swelling in his joints.  He has been taking Orencia 125 mg subcu weekly without any side effects.  He has intermittent discomfort in his right knee joint.  His left knee joint is replaced which is doing well.  He has osteoarthritis in his hands which is not causing much discomfort currently.  He states he had some recent labs by his PCP.  He denies discomfort in his right shoulder and his cervical spine.  Activities of Daily Living:  Patient reports morning stiffness for 10 minutes.   Patient Reports nocturnal pain.  Difficulty dressing/grooming: Denies Difficulty climbing stairs: Reports Difficulty getting out of chair: Reports Difficulty using hands for taps, buttons, cutlery, and/or writing: Denies  Review of Systems  Constitutional:  Positive for fatigue.  HENT:  Negative for mouth sores and mouth dryness.   Eyes:  Negative for dryness.  Respiratory:  Positive for shortness of breath.   Cardiovascular:  Negative for chest pain and palpitations.  Gastrointestinal:  Negative for blood in stool, constipation and diarrhea.  Endocrine: Negative for increased urination.  Genitourinary:  Negative for involuntary urination.  Musculoskeletal:  Positive for joint pain, joint pain, joint swelling and morning stiffness. Negative for gait problem, myalgias, muscle weakness, muscle tenderness and myalgias.  Skin:  Negative for color change, rash and sensitivity to sunlight.  Allergic/Immunologic: Negative for susceptible to infections.  Neurological:   Negative for dizziness and headaches.  Hematological:  Negative for swollen glands.  Psychiatric/Behavioral:  Positive for depressed mood. Negative for sleep disturbance. The patient is nervous/anxious.     PMFS History:  Patient Active Problem List   Diagnosis Date Noted   S/P TKR (total knee replacement), left 12/13/2020   Osteoarthritis of left knee 11/28/2020   Rheumatoid arthritis with rheumatoid factor of multiple sites without organ or systems involvement (HCC) 09/10/2016   Primary osteoarthritis of both hands 09/10/2016   Primary osteoarthritis of both knees 09/10/2016   Primary osteoarthritis of both feet 09/10/2016   DJD (degenerative joint disease), cervical 09/10/2016   History of tremor 09/10/2016   History of sleep apnea 09/10/2016   Coronary artery disease involving native heart 09/10/2016   Smoker 09/10/2016   High risk medication use 09/10/2016   Tear of right rotator cuff 03/14/2012   Impingement syndrome of right shoulder 03/14/2012   AC (acromioclavicular) arthritis, right 03/14/2012   Disorder of tendon of biceps 03/14/2012    Past Medical History:  Diagnosis Date   AC (acromioclavicular) arthritis 03/14/2012   Anxiety    Arthritis    "shoulders; knees" (07/17/2013)   Chronic bronchitis (HCC)    Congestive heart failure (CHF) (HCC)    per patient    DDD (degenerative disc disease)    Depression    Disorder of tendon of biceps 03/14/2012   GERD (gastroesophageal reflux disease)    Hyperlipemia    Hypertension    Impingement syndrome of right shoulder 03/14/2012   Pneumonia 2012   "  once" (07/17/2013)   Rheumatoid arthritis (HCC)    Seasonal allergies    Sleep apnea    CPAP    Tear of right rotator cuff 03/14/2012    Family History  Problem Relation Age of Onset   Breast cancer Mother    Hypertension Mother    Hypertension Father    Multiple sclerosis Sister    Past Surgical History:  Procedure Laterality Date   ANTERIOR CERVICAL  DECOMP/DISCECTOMY FUSION  08/2011   [place]   BUNIONECTOMY Bilateral 1998   DEBRIDEMENT AND CLOSURE WOUND  12/2012   "back of my neck" (07/17/2013)   DEBRIDEMENT AND CLOSURE WOUND N/A 07/17/2013   Procedure: DEBRIDEMENT  OF SKIN, SUBCUTANEOUS TISSUE BONE, MUSCLE FLAP, AND CLOSURE WOUND;  Surgeon: Glenna Fellows, MD;  Location: [facility];  Service: Plastics;  Laterality: N/A;   EXCISIONAL HEMORRHOIDECTOMY  ~ 1996   INCISION AND DRAINAGE OF WOUND  10/2012 X 2   "back of my neck" (07/17/2013)   MUSCLE FLAP CLOSURE  07/17/2013   "to back of my neck" (07/17/2013)   NECK SURGERY  09/2012   "spur removed from back of my neck" (07/17/2013)   SHOULDER ARTHROSCOPY Left 2008   SHOULDER ARTHROSCOPY W/ ROTATOR CUFF REPAIR Right 2013   "and bicep repair" (07/17/2013)   TONSILLECTOMY  ~ 1974   TOTAL KNEE ARTHROPLASTY Left 12/13/2020   Procedure: TOTAL KNEE ARTHROPLASTY;  Surgeon: Teryl Lucy, MD;  Location: [facility];  Service: Orthopedics;  Laterality: Left;   Social History   Social History Narrative   Not on file   Immunization History  Administered Date(s) Administered   Influenza,inj,Quad PF,6+ Mos 07/18/2013   Moderna Sars-Covid-2 Vaccination 10/31/2019, 11/28/2019, 07/22/2020   Pneumococcal Polysaccharide-23 07/18/2013   Tdap 11/28/2006, 04/30/2014     Objective: Vital Signs: BP 124/85 (BP Location: Left Arm, Patient Position: Sitting, Cuff Size: Large)   Pulse (!) 52   Resp 17   Ht 6\' 2"  (1.88 m)   Wt 262 lb (118.8 kg)   BMI 33.64 kg/m    Physical Exam Vitals and nursing note reviewed.  Constitutional:      Appearance: He is well-developed.  HENT:     Head: Normocephalic and atraumatic.  Eyes:     Conjunctiva/sclera: Conjunctivae normal.     Pupils: Pupils are equal, round, and reactive to light.  Cardiovascular:     Rate and Rhythm: Normal rate and regular rhythm.     Heart sounds: Normal heart sounds.  Pulmonary:     Effort: Pulmonary effort is normal.     Breath  sounds: Normal breath sounds.  Abdominal:     General: Bowel sounds are normal.     Palpations: Abdomen is soft.  Musculoskeletal:     Cervical back: Normal range of motion and neck supple.  Skin:    General: Skin is warm and dry.     Capillary Refill: Capillary refill takes less than 2 seconds.  Neurological:     Mental Status: He is alert and oriented to person, place, and time.  Psychiatric:        Behavior: Behavior normal.      Musculoskeletal Exam: He had limited range of motion of the cervical spine.  He had some discomfort range of motion of the lumbar spine.  Shoulder joints, elbow joints, wrist joints, MCPs PIPs and DIPs with good range of motion with no synovitis.  Bilateral PIP and DIP thickening was noted.  Hip joints were in good range of motion.  Right knee  joint was in good range of motion.  Left knee joint is replaced.  There was no tenderness over ankles or MTPs.    CDAI Exam: CDAI Score: 1.5  Patient Global: 3 mm; Provider Global: 2 mm Swollen: 0 ; Tender: 1  Joint Exam 05/15/2022      Right  Left  Knee   Tender        Investigation: No additional findings.  Imaging: No results found.  Recent Labs: Lab Results  Component Value Date   WBC 4.0 12/11/2021   HGB 13.5 12/11/2021   PLT 216 12/11/2021   NA 138 12/11/2021   K 4.2 12/11/2021   CL 102 12/11/2021   CO2 27 12/11/2021   GLUCOSE 104 (H) 12/11/2021   BUN 28 (H) 12/11/2021   CREATININE 1.77 (H) 12/11/2021   BILITOT 0.5 12/11/2021   ALKPHOS 68 01/10/2017   AST 19 12/11/2021   ALT 18 12/11/2021   PROT 6.9 12/11/2021   ALBUMIN 4.6 01/10/2017   CALCIUM 9.8 12/11/2021   GFRAA 79 12/17/2019   QFTBGOLDPLUS NEGATIVE 12/11/2021    Speciality Comments: PLQ Eye Exam: 11/19/2018 WNL @ [facility] Follow up in 1 year  Procedures:  No procedures performed Allergies: Codeine, Daptomycin, Hydroxyzine, Latex, and Vancomycin   Assessment / Plan:     Visit Diagnoses: Rheumatoid arthritis  with rheumatoid factor of multiple sites without organ or systems involvement (HCC)-he had no synovitis on examination.  He has been taking Orencia 125 mg subcu weekly without any side effects.  He continues to have some discomfort in his right knee joint.  No swelling or effusion was noted.  High risk medication use - Orencia 125 mg sq injections once weekly.  Labs obtained on December 21, 2021 were reviewed.  His creatinine was elevated at 1.77.  Patient states he stopped meloxicam after that.  TB gold was negative on December 11, 2021.  Patient states he had labs at his PCPs office in August.  We tried calling the office several times but did not get a response.  Patient states that he will forward the labs to [place].  He was advised to get labs every 3 months to monitor for drug toxicity.  Information on immunization was placed in the AVS.  He was advised to hold Orencia if he develops an infection and resume after the infection resolves.  Primary osteoarthritis of both hands-bilateral PIP and DIP thickening with no synovitis was noted.  Primary osteoarthritis of right knee-he continues to have discomfort in his right knee.  X-rays from 2021 were reviewed which showed severe osteoarthritis and severe chondromalacia patella.  He states he will eventually require right total knee replacement.  S/P TKR (total knee replacement), left - Performed by Dr. Dion Saucier on 12/13/2020.  Doing well.  Primary osteoarthritis of both feet-he did is any discomfort today.  Impingement syndrome of right shoulder-he had good range of motion.  DDD (degenerative disc disease), cervical -he had limited range of motion of the cervical spine.  He denies any discomfort.  He had C-spine fusion in the past.  Chronic lower back pain without sciatica-he continues to have some lower back pain.  Core strengthening exercises and weight loss was discussed.  Other medical problems are listed as follows:  Family history of multiple  sclerosis  History of sleep apnea  History of depression  History of coronary artery disease  History of tremor  History of CHF (congestive heart failure)  Former smoker  Orders: No orders of  the defined types were placed in this encounter.  No orders of the defined types were placed in this encounter.    Follow-Up Instructions: Return in about 5 months (around 10/15/2022) for Rheumatoid arthritis, Osteoarthritis.   Pollyann Savoy, MD  Note - This record has been created using Animal nutritionist.  Chart creation errors have been sought, but may not always  have been located. Such creation errors do not reflect on  the standard of medical care.

## 2022-05-15 ENCOUNTER — Encounter: Payer: Self-pay | Admitting: Rheumatology

## 2022-05-15 ENCOUNTER — Ambulatory Visit: Payer: Medicare Other | Attending: Rheumatology | Admitting: Rheumatology

## 2022-05-15 VITALS — BP 124/85 | HR 52 | Resp 17 | Ht 74.0 in | Wt 262.0 lb

## 2022-05-15 DIAGNOSIS — M1711 Unilateral primary osteoarthritis, right knee: Secondary | ICD-10-CM

## 2022-05-15 DIAGNOSIS — M19072 Primary osteoarthritis, left ankle and foot: Secondary | ICD-10-CM

## 2022-05-15 DIAGNOSIS — M19042 Primary osteoarthritis, left hand: Secondary | ICD-10-CM

## 2022-05-15 DIAGNOSIS — M7541 Impingement syndrome of right shoulder: Secondary | ICD-10-CM

## 2022-05-15 DIAGNOSIS — M5030 Other cervical disc degeneration, unspecified cervical region: Secondary | ICD-10-CM

## 2022-05-15 DIAGNOSIS — Z79899 Other long term (current) drug therapy: Secondary | ICD-10-CM

## 2022-05-15 DIAGNOSIS — G8929 Other chronic pain: Secondary | ICD-10-CM

## 2022-05-15 DIAGNOSIS — Z820 Family history of epilepsy and other diseases of the nervous system: Secondary | ICD-10-CM

## 2022-05-15 DIAGNOSIS — Z8679 Personal history of other diseases of the circulatory system: Secondary | ICD-10-CM

## 2022-05-15 DIAGNOSIS — M0579 Rheumatoid arthritis with rheumatoid factor of multiple sites without organ or systems involvement: Secondary | ICD-10-CM

## 2022-05-15 DIAGNOSIS — Z8669 Personal history of other diseases of the nervous system and sense organs: Secondary | ICD-10-CM

## 2022-05-15 DIAGNOSIS — M19071 Primary osteoarthritis, right ankle and foot: Secondary | ICD-10-CM

## 2022-05-15 DIAGNOSIS — M19041 Primary osteoarthritis, right hand: Secondary | ICD-10-CM | POA: Diagnosis not present

## 2022-05-15 DIAGNOSIS — Z8659 Personal history of other mental and behavioral disorders: Secondary | ICD-10-CM

## 2022-05-15 DIAGNOSIS — Z87891 Personal history of nicotine dependence: Secondary | ICD-10-CM

## 2022-05-15 DIAGNOSIS — Z96652 Presence of left artificial knee joint: Secondary | ICD-10-CM

## 2022-05-15 DIAGNOSIS — M545 Low back pain, unspecified: Secondary | ICD-10-CM

## 2022-05-15 NOTE — Patient Instructions (Signed)
Standing Labs We placed an order today for your standing lab work.   Please have your standing labs drawn in November and every 3 months  If possible, please have your labs drawn 2 weeks prior to your appointment so that the provider can discuss your results at your appointment.  Please note that you may see your imaging and lab results in MyChart before we have reviewed them. We may be awaiting multiple results to interpret others before contacting you. Please allow our office up to 72 hours to thoroughly review all of the results before contacting the office for clarification of your results.  We currently have open lab daily: Monday through Thursday from 1:30 PM-4:30 PM and Friday from 1:30 PM- 4:00 PM If possible, please come for your lab work on Monday, Thursday or Friday afternoons, as you may experience shorter wait times.   Effective July 25, 2022 the new lab hours will change to: Monday through Thursday from 1:30 PM-5:00 PM and Friday from 8:30 AM-12:00 PM If possible, please come for your lab work on Monday and Thursday afternoons, as you may experience shorter wait times.  Please be advised, all patients with office appointments requiring lab work will take precedent over walk-in lab work.    The office is located at [address], [address], [place], [place] [address] No appointment is necessary.   Labs are drawn by [facility]. Please bring your co-pay at the time of your lab draw.  You may receive a bill from [facility] for your lab work.  Please note if you are on Hydroxychloroquine and and an order has been placed for a Hydroxychloroquine level, you will need to have it drawn 4 hours or more after your last dose.  If you wish to have your labs drawn at another location, please call the office 24 hours in advance to send orders.  If you have any questions regarding directions or hours of operation,  please call [phone].   As a reminder, please drink plenty of water prior  to coming for your lab work. Thanks!   Vaccines You are taking a medication(s) that can suppress your immune system.  The following immunizations are recommended: Flu annually Covid-19  Td/Tdap (tetanus, diphtheria, pertussis) every 10 years Pneumonia (Prevnar 15 then Pneumovax 23 at least 1 year apart.  Alternatively, can take Prevnar 20 without needing additional dose) Shingrix: 2 doses from 4 weeks to 6 months apart  Please check with your PCP to make sure you are up to date.   If you have signs or symptoms of an infection or start antibiotics: First, call your PCP for workup of your infection. Hold your medication through the infection, until you complete your antibiotics, and until symptoms resolve if you take the following: Injectable medication (Actemra, Benlysta, Cimzia, Cosentyx, Enbrel, Humira, Kevzara, Orencia, Remicade, Simponi, Stelara, Taltz, Tremfya) Methotrexate Leflunomide (Arava) Mycophenolate (Cellcept) Xeljanz, Olumiant, or Rinvoq  

## 2022-05-17 ENCOUNTER — Telehealth: Payer: Self-pay | Admitting: *Deleted

## 2022-05-17 NOTE — Telephone Encounter (Signed)
Labs received from:[facility]  Drawn on:04/06/2022  Reviewed by:Hazel Sams, PA-C  Labs drawn: CBC, CMP, ESR  Results: WBC  3.8    RBC 4.09    Hgb 13.3    Hct 39.9    Lymphs Absolute 0.8    ESR 28    Glucose 113     Creat. 1.5    GFR 52  Patient on Orencia 125 mg SQ once weekly.  Per Hazel Sams, PA-C, kidney function stable. Repeat CBC with diff in 1 month.   Patient advised to repeat CBC with diff in 1 month. Patient verbalized understanding.

## 2022-05-18 ENCOUNTER — Other Ambulatory Visit (HOSPITAL_COMMUNITY): Payer: Self-pay

## 2022-05-22 ENCOUNTER — Other Ambulatory Visit (HOSPITAL_COMMUNITY): Payer: Self-pay

## 2022-05-23 ENCOUNTER — Other Ambulatory Visit (HOSPITAL_COMMUNITY): Payer: Self-pay

## 2022-06-15 ENCOUNTER — Other Ambulatory Visit: Payer: Self-pay | Admitting: Physician Assistant

## 2022-06-15 ENCOUNTER — Other Ambulatory Visit (HOSPITAL_COMMUNITY): Payer: Self-pay

## 2022-06-15 DIAGNOSIS — Z79899 Other long term (current) drug therapy: Secondary | ICD-10-CM

## 2022-06-15 DIAGNOSIS — M0579 Rheumatoid arthritis with rheumatoid factor of multiple sites without organ or systems involvement: Secondary | ICD-10-CM

## 2022-06-15 MED ORDER — ORENCIA CLICKJECT 125 MG/ML ~~LOC~~ SOAJ
125.0000 mg | SUBCUTANEOUS | 2 refills | Status: DC
  Filled 2022-06-15: qty 4, 28d supply, fill #0
  Filled 2022-07-10: qty 4, 28d supply, fill #1
  Filled 2022-08-13: qty 4, 28d supply, fill #2

## 2022-06-15 NOTE — Telephone Encounter (Signed)
Next Visit: 10/16/2022  Last Visit: 05/15/2022  Last Fill: 04/23/2022  [id number] arthritis with rheumatoid factor of multiple sites without organ or systems involvement   Current Dose per office note 05/15/2022: Orencia 125 mg sq injections once weekly  Labs: 04/06/2022 WBC  3.8               RBC 4.09               Hgb 13.3               Hct 39.9               Lymphs Absolute 0.8               ESR 28               Glucose 113                Creat. 1.5               GFR 52  TB Gold: 12/11/2021 Neg    Okay to refill Orencia?

## 2022-06-20 ENCOUNTER — Other Ambulatory Visit (HOSPITAL_COMMUNITY): Payer: Self-pay

## 2022-07-10 ENCOUNTER — Other Ambulatory Visit (HOSPITAL_COMMUNITY): Payer: Self-pay

## 2022-07-18 ENCOUNTER — Other Ambulatory Visit (HOSPITAL_COMMUNITY): Payer: Self-pay

## 2022-08-09 ENCOUNTER — Other Ambulatory Visit (HOSPITAL_COMMUNITY): Payer: Self-pay

## 2022-08-13 ENCOUNTER — Other Ambulatory Visit (HOSPITAL_COMMUNITY): Payer: Self-pay

## 2022-08-14 ENCOUNTER — Other Ambulatory Visit (HOSPITAL_COMMUNITY): Payer: Self-pay

## 2022-09-03 ENCOUNTER — Other Ambulatory Visit: Payer: Self-pay | Admitting: Rheumatology

## 2022-09-03 ENCOUNTER — Other Ambulatory Visit (HOSPITAL_COMMUNITY): Payer: Self-pay

## 2022-09-03 DIAGNOSIS — M0579 Rheumatoid arthritis with rheumatoid factor of multiple sites without organ or systems involvement: Secondary | ICD-10-CM

## 2022-09-03 DIAGNOSIS — Z79899 Other long term (current) drug therapy: Secondary | ICD-10-CM

## 2022-09-03 MED ORDER — ORENCIA CLICKJECT 125 MG/ML ~~LOC~~ SOAJ
125.0000 mg | SUBCUTANEOUS | 0 refills | Status: DC
  Filled 2022-09-03 – 2022-09-07 (×2): qty 4, 28d supply, fill #0

## 2022-09-03 NOTE — Telephone Encounter (Signed)
Next Visit: 10/16/2022  Last Visit: 05/15/2022  Last Fill: 06/15/2022  [id number] arthritis with rheumatoid factor of multiple sites without organ or systems involvement   Current Dose per office note on 05/15/2022: Orencia 125 mg sq injections once weekly.    Labs: 04/06/2022 Labs drawn: CBC, CMP, ESR  Results: WBC  3.8               RBC 4.09               Hgb 13.3               Hct 39.9               Lymphs Absolute 0.8               ESR 28               Glucose 113                Creat. 1.5               GFR 52  TB Gold: 12/11/2021 negative    Advised patient that he is overdue to update labs. Patient will have labs drawn at his PCPs office. I have faxed orders.   Okay to refill 30 day supply of orencia?

## 2022-09-07 ENCOUNTER — Other Ambulatory Visit (HOSPITAL_COMMUNITY): Payer: Self-pay

## 2022-09-07 ENCOUNTER — Other Ambulatory Visit: Payer: Self-pay

## 2022-09-10 ENCOUNTER — Other Ambulatory Visit: Payer: Self-pay

## 2022-09-11 ENCOUNTER — Other Ambulatory Visit (HOSPITAL_COMMUNITY): Payer: Self-pay

## 2022-10-02 NOTE — Progress Notes (Deleted)
Office Visit Note  Patient: Dylan Dudley             Date of Birth: [birth year]           MRN: [id number]             PCP: Tobe Sos, MD Referring: Tobe Sos, MD Visit Date: 10/16/2022 Occupation: @GUAROCC$ @  Subjective:  No chief complaint on file.   History of Present Illness: Dylan Dudley is a 62 y.o. male ***     Activities of Daily Living:  Patient reports morning stiffness for *** {minute/hour:19697}.   Patient {ACTIONS;DENIES/REPORTS:21021675::"Denies"} nocturnal pain.  Difficulty dressing/grooming: {ACTIONS;DENIES/REPORTS:21021675::"Denies"} Difficulty climbing stairs: {ACTIONS;DENIES/REPORTS:21021675::"Denies"} Difficulty getting out of chair: {ACTIONS;DENIES/REPORTS:21021675::"Denies"} Difficulty using hands for taps, buttons, cutlery, and/or writing: {ACTIONS;DENIES/REPORTS:21021675::"Denies"}  No Rheumatology ROS completed.   PMFS History:  Patient Active Problem List   Diagnosis Date Noted   S/P TKR (total knee replacement), left 12/13/2020   Osteoarthritis of left knee 11/28/2020   Rheumatoid arthritis with rheumatoid factor of multiple sites without organ or systems involvement ([place]) 09/10/2016   Primary osteoarthritis of both hands 09/10/2016   Primary osteoarthritis of both knees 09/10/2016   Primary osteoarthritis of both feet 09/10/2016   DJD (degenerative joint disease), cervical 09/10/2016   History of tremor 09/10/2016   History of sleep apnea 09/10/2016   Coronary artery disease involving native heart 09/10/2016   Smoker 09/10/2016   High risk medication use 09/10/2016   Tear of right rotator cuff 03/14/2012   Impingement syndrome of right shoulder 03/14/2012   AC (acromioclavicular) arthritis, right 03/14/2012   Disorder of tendon of biceps 03/14/2012    Past Medical History:  Diagnosis Date   AC (acromioclavicular) arthritis 03/14/2012   Anxiety    Arthritis    "shoulders; knees" (07/17/2013)   Chronic  bronchitis (HCC)    Congestive heart failure (CHF) (HCC)    per patient    DDD (degenerative disc disease)    Depression    Disorder of tendon of biceps 03/14/2012   GERD (gastroesophageal reflux disease)    Hyperlipemia    Hypertension    Impingement syndrome of right shoulder 03/14/2012   Pneumonia 2012   "once" (07/17/2013)   Rheumatoid arthritis (HCC)    Seasonal allergies    Sleep apnea    CPAP    Tear of right rotator cuff 03/14/2012    Family History  Problem Relation Age of Onset   Breast cancer Mother    Hypertension Mother    Hypertension Father    Multiple sclerosis Sister    Past Surgical History:  Procedure Laterality Date   ANTERIOR CERVICAL DECOMP/DISCECTOMY FUSION  08/2011   [place]   BUNIONECTOMY Bilateral 1998   DEBRIDEMENT AND CLOSURE WOUND  12/2012   "back of my neck" (07/17/2013)   DEBRIDEMENT AND CLOSURE WOUND N/A 07/17/2013   Procedure: DEBRIDEMENT  OF SKIN, SUBCUTANEOUS TISSUE BONE, MUSCLE FLAP, AND CLOSURE WOUND;  Surgeon: Irene Limbo, MD;  Location: [facility];  Service: Plastics;  Laterality: N/A;   EXCISIONAL HEMORRHOIDECTOMY  ~ [place]  10/2012 X 2   "back of my neck" (07/17/2013)   MUSCLE FLAP CLOSURE  07/17/2013   "to back of my neck" (07/17/2013)   NECK SURGERY  09/2012   "spur removed from back of my neck" (07/17/2013)   SHOULDER ARTHROSCOPY Left 2008   SHOULDER ARTHROSCOPY W/ ROTATOR CUFF REPAIR Right 2013   "and bicep repair" (07/17/2013)   TONSILLECTOMY  ~  1974   TOTAL KNEE ARTHROPLASTY Left 12/13/2020   Procedure: TOTAL KNEE ARTHROPLASTY;  Surgeon: Marchia Bond, MD;  Location: [facility];  Service: Orthopedics;  Laterality: Left;   Social History   Social History Narrative   Not on file   Immunization History  Administered Date(s) Administered   Influenza,inj,Quad PF,6+ Mos 07/18/2013   Moderna Sars-Covid-2 Vaccination 10/31/2019, 11/28/2019, 07/22/2020   Pneumococcal Polysaccharide-23 07/18/2013    Tdap 11/28/2006, 04/30/2014     Objective: Vital Signs: There were no vitals taken for this visit.   Physical Exam   Musculoskeletal Exam: ***  CDAI Exam: CDAI Score: -- Patient Global: --; Provider Global: -- Swollen: --; Tender: -- Joint Exam 10/16/2022   No joint exam has been documented for this visit   There is currently no information documented on the homunculus. Go to the Rheumatology activity and complete the homunculus joint exam.  Investigation: No additional findings.  Imaging: No results found.  Recent Labs: Lab Results  Component Value Date   WBC 4.0 12/11/2021   HGB 13.5 12/11/2021   PLT 216 12/11/2021   NA 138 12/11/2021   K 4.2 12/11/2021   CL 102 12/11/2021   CO2 27 12/11/2021   GLUCOSE 104 (H) 12/11/2021   BUN 28 (H) 12/11/2021   CREATININE 1.77 (H) 12/11/2021   BILITOT 0.5 12/11/2021   ALKPHOS 68 01/10/2017   AST 19 12/11/2021   ALT 18 12/11/2021   PROT 6.9 12/11/2021   ALBUMIN 4.6 01/10/2017   CALCIUM 9.8 12/11/2021   GFRAA 79 12/17/2019   QFTBGOLDPLUS NEGATIVE 12/11/2021    Speciality Comments: PLQ Eye Exam: 11/19/2018 WNL @ [facility] Follow up in 1 year  Procedures:  No procedures performed Allergies: Codeine, Daptomycin, Hydroxyzine, Latex, and Vancomycin   Assessment / Plan:     Visit Diagnoses: No diagnosis found.  Orders: No orders of the defined types were placed in this encounter.  No orders of the defined types were placed in this encounter.   Face-to-face time spent with patient was *** minutes. Greater than 50% of time was spent in counseling and coordination of care.  Follow-Up Instructions: No follow-ups on file.   Earnestine Mealing, CMA  Note - This record has been created using Editor, commissioning.  Chart creation errors have been sought, but may not always  have been located. Such creation errors do not reflect on  the standard of medical care.

## 2022-10-04 ENCOUNTER — Other Ambulatory Visit (HOSPITAL_COMMUNITY): Payer: Self-pay

## 2022-10-04 ENCOUNTER — Telehealth: Payer: Self-pay | Admitting: Rheumatology

## 2022-10-04 ENCOUNTER — Other Ambulatory Visit: Payer: Self-pay | Admitting: Physician Assistant

## 2022-10-04 DIAGNOSIS — Z79899 Other long term (current) drug therapy: Secondary | ICD-10-CM

## 2022-10-04 DIAGNOSIS — M0579 Rheumatoid arthritis with rheumatoid factor of multiple sites without organ or systems involvement: Secondary | ICD-10-CM

## 2022-10-04 NOTE — Telephone Encounter (Signed)
Left message to advise patient his prescription was declined because he is overdue for labs. Advised the last set of labs we have are from July 2023.

## 2022-10-04 NOTE — Telephone Encounter (Signed)
Patient called stating the pharmacist at [facility] told him Dr. Estanislado Pandy denied his prescription refill of Orencia.  Patient states he only has 1 injection remaining.

## 2022-10-10 ENCOUNTER — Other Ambulatory Visit (HOSPITAL_COMMUNITY): Payer: Self-pay

## 2022-10-12 ENCOUNTER — Other Ambulatory Visit: Payer: Self-pay

## 2022-10-12 ENCOUNTER — Other Ambulatory Visit (HOSPITAL_COMMUNITY): Payer: Self-pay

## 2022-10-12 ENCOUNTER — Telehealth: Payer: Self-pay | Admitting: *Deleted

## 2022-10-12 ENCOUNTER — Other Ambulatory Visit: Payer: Self-pay | Admitting: *Deleted

## 2022-10-12 DIAGNOSIS — M0579 Rheumatoid arthritis with rheumatoid factor of multiple sites without organ or systems involvement: Secondary | ICD-10-CM

## 2022-10-12 DIAGNOSIS — Z79899 Other long term (current) drug therapy: Secondary | ICD-10-CM

## 2022-10-12 MED ORDER — ORENCIA CLICKJECT 125 MG/ML ~~LOC~~ SOAJ
125.0000 mg | SUBCUTANEOUS | 0 refills | Status: DC
  Filled 2022-10-12: qty 4, 28d supply, fill #0
  Filled 2022-10-31: qty 4, 28d supply, fill #1
  Filled 2022-11-28: qty 4, 28d supply, fill #2

## 2022-10-12 NOTE — Telephone Encounter (Signed)
Labs received from:[facility]  Drawn on:09/07/2022  Reviewed by:Dr. Bo Merino   Labs drawn:CBC, CMP, Lipids  Results:Triglycerides 152   Calc Osm 303  Patient is on Orencia 125 mg SQ weekly.

## 2022-10-12 NOTE — Telephone Encounter (Signed)
Patient would like a refill on Orencia.  Next Visit: 10/31/2022  Last Visit: 05/15/2022   Last Fill: 09/03/2022 (30 day supply)  [id number] arthritis with rheumatoid factor of multiple sites without organ or systems involvement    Current Dose per office note 05/15/2022: Orencia 125 mg sq injections once weekly.     Labs: 09/07/2022 Triglycerides 152   Calc Osm 303  TB Gold: 12/11/2021 negative    Okay to refill Orencia?

## 2022-10-15 ENCOUNTER — Other Ambulatory Visit (HOSPITAL_COMMUNITY): Payer: Self-pay

## 2022-10-16 ENCOUNTER — Ambulatory Visit: Payer: Medicare Other | Admitting: Physician Assistant

## 2022-10-16 DIAGNOSIS — M545 Low back pain, unspecified: Secondary | ICD-10-CM

## 2022-10-16 DIAGNOSIS — M19072 Primary osteoarthritis, left ankle and foot: Secondary | ICD-10-CM

## 2022-10-16 DIAGNOSIS — Z79899 Other long term (current) drug therapy: Secondary | ICD-10-CM

## 2022-10-16 DIAGNOSIS — Z87891 Personal history of nicotine dependence: Secondary | ICD-10-CM

## 2022-10-16 DIAGNOSIS — Z96652 Presence of left artificial knee joint: Secondary | ICD-10-CM

## 2022-10-16 DIAGNOSIS — M1711 Unilateral primary osteoarthritis, right knee: Secondary | ICD-10-CM

## 2022-10-16 DIAGNOSIS — M19041 Primary osteoarthritis, right hand: Secondary | ICD-10-CM

## 2022-10-16 DIAGNOSIS — M0579 Rheumatoid arthritis with rheumatoid factor of multiple sites without organ or systems involvement: Secondary | ICD-10-CM

## 2022-10-16 DIAGNOSIS — Z820 Family history of epilepsy and other diseases of the nervous system: Secondary | ICD-10-CM

## 2022-10-16 DIAGNOSIS — M5030 Other cervical disc degeneration, unspecified cervical region: Secondary | ICD-10-CM

## 2022-10-16 DIAGNOSIS — Z8669 Personal history of other diseases of the nervous system and sense organs: Secondary | ICD-10-CM

## 2022-10-16 DIAGNOSIS — Z8679 Personal history of other diseases of the circulatory system: Secondary | ICD-10-CM

## 2022-10-16 DIAGNOSIS — M7541 Impingement syndrome of right shoulder: Secondary | ICD-10-CM

## 2022-10-16 DIAGNOSIS — Z8659 Personal history of other mental and behavioral disorders: Secondary | ICD-10-CM

## 2022-10-19 NOTE — Progress Notes (Unsigned)
Office Visit Note  Patient: Dylan Dudley             Date of Birth: 26-Feb-1961           MRN: [id number]             PCP: Elise Benne, MD Referring: Elise Benne, MD Visit Date: 10/31/2022 Occupation: @GUAROCC @  Subjective:  Hammertoes   History of Present Illness: ABDULAI BLAYLOCK is a 62 y.o. male with history of seropositive rheumatoid arthritis and osteoarthritis.  He remains on orencia 125 mg sq injections every week.  He has been tolerating Orencia without any side effects or injection site reactions.  He denies any signs or symptoms of a rheumatoid arthritis flare.  He is not experiencing any joint swelling at this time.  His morning stiffness has been lasting for about 10 minutes daily.  He states that he has occasional pain and stiffness in his hammertoes especially in his right foot.  He has seen a podiatrist in the past but is not ready for surgery at this time.  Patient reports that he is hoping to have his right knee replaced soon at the [facility] in [place].  He states his left knee replacement is doing well. He denies any recent or recurrent infections.  He denies any new medical conditions.   Activities of Daily Living:  Patient reports morning stiffness for 10 minutes.   Patient Denies nocturnal pain.  Difficulty dressing/grooming: Denies Difficulty climbing stairs: Reports Difficulty getting out of chair: Reports Difficulty using hands for taps, buttons, cutlery, and/or writing: Denies  Review of Systems  Constitutional: Negative.  Negative for fatigue.  HENT:  Positive for mouth dryness. Negative for mouth sores.   Eyes: Negative.  Negative for dryness.  Respiratory: Negative.  Negative for shortness of breath.   Cardiovascular: Negative.  Negative for chest pain and palpitations.  Gastrointestinal:  Negative for blood in stool, constipation and diarrhea.  Endocrine: Negative.  Negative for increased urination.  Genitourinary: Negative.  Negative  for involuntary urination.  Musculoskeletal:  Positive for joint pain, joint pain, joint swelling and morning stiffness. Negative for gait problem, myalgias, muscle weakness, muscle tenderness and myalgias.  Skin:  Negative for pallor, rash, hair loss and sensitivity to sunlight.  Allergic/Immunologic: Negative.  Negative for susceptible to infections.  Neurological: Negative.  Negative for dizziness and headaches.  Hematological: Negative.  Negative for swollen glands.  Psychiatric/Behavioral:  Positive for depressed mood and sleep disturbance. The patient is nervous/anxious.     PMFS History:  Patient Active Problem List   Diagnosis Date Noted   S/P TKR (total knee replacement), left 12/13/2020   Osteoarthritis of left knee 11/28/2020   Rheumatoid arthritis with rheumatoid factor of multiple sites without organ or systems involvement (HCC) 09/10/2016   Primary osteoarthritis of both hands 09/10/2016   Primary osteoarthritis of both knees 09/10/2016   Primary osteoarthritis of both feet 09/10/2016   DJD (degenerative joint disease), cervical 09/10/2016   History of tremor 09/10/2016   History of sleep apnea 09/10/2016   Coronary artery disease involving native heart 09/10/2016   Smoker 09/10/2016   High risk medication use 09/10/2016   Tear of right rotator cuff 03/14/2012   Impingement syndrome of right shoulder 03/14/2012   AC (acromioclavicular) arthritis, right 03/14/2012   Disorder of tendon of biceps 03/14/2012    Past Medical History:  Diagnosis Date   AC (acromioclavicular) arthritis 03/14/2012   Anxiety    Arthritis    "shoulders;  knees" (07/17/2013)   Chronic bronchitis (HCC)    Congestive heart failure (CHF) (HCC)    per patient    DDD (degenerative disc disease)    Depression    Disorder of tendon of biceps 03/14/2012   GERD (gastroesophageal reflux disease)    Hyperlipemia    Hypertension    Impingement syndrome of right shoulder 03/14/2012   Pneumonia 2012    "once" (07/17/2013)   Rheumatoid arthritis (HCC)    Seasonal allergies    Sleep apnea    CPAP    Tear of right rotator cuff 03/14/2012    Family History  Problem Relation Age of Onset   Breast cancer Mother    Hypertension Mother    Hypertension Father    Multiple sclerosis Sister    Past Surgical History:  Procedure Laterality Date   ANTERIOR CERVICAL DECOMP/DISCECTOMY FUSION  08/2011   [place]   BUNIONECTOMY Bilateral 1998   DEBRIDEMENT AND CLOSURE WOUND  12/2012   "back of my neck" (07/17/2013)   DEBRIDEMENT AND CLOSURE WOUND N/A 07/17/2013   Procedure: DEBRIDEMENT  OF SKIN, SUBCUTANEOUS TISSUE BONE, MUSCLE FLAP, AND CLOSURE WOUND;  Surgeon: Irene Limbo, MD;  Location: [facility];  Service: Plastics;  Laterality: N/A;   EXCISIONAL HEMORRHOIDECTOMY  ~ [place]  10/2012 X 2   "back of my neck" (07/17/2013)   MUSCLE FLAP CLOSURE  07/17/2013   "to back of my neck" (07/17/2013)   NECK SURGERY  09/2012   "spur removed from back of my neck" (07/17/2013)   SHOULDER ARTHROSCOPY Left 2008   SHOULDER ARTHROSCOPY W/ ROTATOR CUFF REPAIR Right 2013   "and bicep repair" (07/17/2013)   TONSILLECTOMY  ~ [place] Left 12/13/2020   Procedure: TOTAL KNEE ARTHROPLASTY;  Surgeon: Marchia Bond, MD;  Location: [facility];  Service: Orthopedics;  Laterality: Left;   Social History   Social History Narrative   Not on file   Immunization History  Administered Date(s) Administered   Influenza,inj,Quad PF,6+ Mos 07/18/2013   Moderna Sars-Covid-2 Vaccination 10/31/2019, 11/28/2019, 07/22/2020   Pneumococcal Polysaccharide-23 07/18/2013   Tdap 11/28/2006, 04/30/2014     Objective: Vital Signs: BP 119/81 (BP Location: Left Arm, Patient Position: Sitting, Cuff Size: Large)   Pulse (!) 53   Ht 6\' 2"  (1.88 m)   Wt 262 lb 12.8 oz (119.2 kg)   BMI 33.74 kg/m    Physical Exam Vitals and nursing note reviewed.  Constitutional:      Appearance:  He is well-developed.  HENT:     Head: Normocephalic and atraumatic.  Eyes:     Conjunctiva/sclera: Conjunctivae normal.     Pupils: Pupils are equal, round, and reactive to light.  Cardiovascular:     Rate and Rhythm: Normal rate and regular rhythm.     Heart sounds: Normal heart sounds.  Pulmonary:     Effort: Pulmonary effort is normal.     Breath sounds: Normal breath sounds.  Abdominal:     General: Bowel sounds are normal.     Palpations: Abdomen is soft.  Musculoskeletal:     Cervical back: Normal range of motion and neck supple.  Skin:    General: Skin is warm and dry.     Capillary Refill: Capillary refill takes less than 2 seconds.  Neurological:     Mental Status: He is alert and oriented to person, place, and time.  Psychiatric:        Behavior: Behavior normal.  Musculoskeletal Exam: C-spine has limited range of motion.  No midline spinal tenderness or SI joint tenderness.  Shoulder joints, elbow joints, wrist joints, MCPs, PIPs, DIPs have good range of motion with no synovitis.  Bilateral PIP and DIP thickening.  Hip joints have good range of motion with no groin pain.  Right knee joint has good range of motion with some discomfort.  Left knee replacement has good range of motion with no warmth or effusion.  Ankle joints have good range of motion with no tenderness or synovitis.  No tenderness or synovitis over MTP joints.  Hammertoes noted especially right second and third toes.  CDAI Exam: CDAI Score: -- Patient Global: 2 mm; Provider Global: 2 mm Swollen: --; Tender: -- Joint Exam 10/31/2022   No joint exam has been documented for this visit   There is currently no information documented on the homunculus. Go to the Rheumatology activity and complete the homunculus joint exam.  Investigation: No additional findings.  Imaging: No results found.  Recent Labs: Lab Results  Component Value Date   WBC 4.0 12/11/2021   HGB 13.5 12/11/2021   PLT 216  12/11/2021   NA 138 12/11/2021   K 4.2 12/11/2021   CL 102 12/11/2021   CO2 27 12/11/2021   GLUCOSE 104 (H) 12/11/2021   BUN 28 (H) 12/11/2021   CREATININE 1.77 (H) 12/11/2021   BILITOT 0.5 12/11/2021   ALKPHOS 68 01/10/2017   AST 19 12/11/2021   ALT 18 12/11/2021   PROT 6.9 12/11/2021   ALBUMIN 4.6 01/10/2017   CALCIUM 9.8 12/11/2021   GFRAA 79 12/17/2019   QFTBGOLDPLUS NEGATIVE 12/11/2021    Speciality Comments: PLQ Eye Exam: 11/19/2018 WNL @ [facility] Follow up in 1 year  Procedures:  No procedures performed Allergies: Codeine, Daptomycin, Hydroxyzine, Latex, and Vancomycin   Assessment / Plan:     Visit Diagnoses: Rheumatoid arthritis with rheumatoid factor of multiple sites without organ or systems involvement [facility]): He has no joint tenderness or synovitis on examination today.  He has not had any signs or symptoms of a rheumatoid arthritis flare.  He has clinically been doing well on Orencia 125 mg subcutaneous injections once weekly.  He has been tolerating Orencia without any side effects or injection site reactions.  He has not missed any doses of Orencia recently.  He has not been experiencing any morning stiffness, nocturnal pain, or difficulty with ADLs.  He continues to have chronic pain in the right knee joint and is planning on proceeding with a total arthroplasty at the [facility] in [place].  Advised patient to hold Orencia at least 1 week prior to and at least 1 to 2 weeks after surgery.  Patient will require clearance by his orthopedist prior to resuming Orencia during the postoperative period.  He voiced understanding.  He was advised to notify us if he develops signs or symptoms of a rheumatoid arthritis flare.  He will follow-up in the office in 5 months or sooner if needed.  High risk medication use - Orencia 125 mg sq injections once weekly.  CBC and CMP updated on 09/07/22.  He will be due to update lab work in March and every 3 months. TB gold negative on  12/11/21.  Future order for TB gold placed today.  No recent or recurrent infections.  Discussed the importance of holding orencia if he develops signs or symptoms of an infection and to resume once the infection has completely cleared.  Advised to hold  Orencia at least 1 week prior to having his left knee replaced.  Discussed that he would likely need to hold Orencia at least 1 to 2 weeks after surgery but will require surgical clearance by his orthopedic surgeon prior to resuming Orencia during the postoperative period.  - Plan: QuantiFERON-TB Gold Plus  Screening for tuberculosis -Future order for TB Gold placed today.  Plan: QuantiFERON-TB Gold Plus  Primary osteoarthritis of both hands: PIP and DIP thickening noted.  No tenderness or synovitis.  Complete fist formation noted bilaterally.  Discussed the importance of joint protection and muscle strengthening.  Primary osteoarthritis of right knee: Chronic pain and stiffness.  Patient plans on proceeding with a right knee replacement soon with the [facility] in [place].  Recommended holding Orencia at least 1 week prior and likely 1 to 2 weeks after surgery.  He will require surgical clearance by the orthopedist prior to resuming Orencia during the postoperative period.  S/P TKR (total knee replacement), left - Performed by Dr. Dion Saucier on 12/13/2020.  Doing well.  Good range of motion with no discomfort.  Primary osteoarthritis of both feet: Hammertoes noted.  Evaluated by podiatry in the past.  Patient does not plan on proceeding with surgery at this time.  No tenderness or synovitis over MTP joints.  Good range of motion of both ankle joints with no tenderness or synovitis.  Discussed the importance of wearing proper fitting shoes.  Impingement syndrome of right shoulder: Right shoulder has good range of motion with no discomfort currently.  DDD (degenerative disc disease), cervical: C-spine has slightly limited range of motion with no discomfort at this  time.  Chronic midline low back pain without sciatica: He is not experiencing any increased discomfort in his lower back at this time.  No symptoms of sciatica at this time.  Other medical conditions are listed as follows:  Family history of multiple sclerosis  History of depression  History of sleep apnea  History of coronary artery disease  History of CHF (congestive heart failure)  History of tremor  Former smoker    Orders: Orders Placed This Encounter  Procedures   QuantiFERON-TB Gold Plus   No orders of the defined types were placed in this encounter.   Follow-Up Instructions: Return in about 5 months (around 03/31/2023) for Rheumatoid arthritis.   Gearldine Bienenstock, PA-C  Note - This record has been created using Dragon software.  Chart creation errors have been sought, but may not always  have been located. Such creation errors do not reflect on  the standard of medical care.

## 2022-10-22 ENCOUNTER — Encounter: Payer: Self-pay | Admitting: Rheumatology

## 2022-10-31 ENCOUNTER — Other Ambulatory Visit: Payer: Self-pay

## 2022-10-31 ENCOUNTER — Other Ambulatory Visit (HOSPITAL_COMMUNITY): Payer: Self-pay

## 2022-10-31 ENCOUNTER — Ambulatory Visit: Payer: Medicare Other | Attending: Physician Assistant | Admitting: Physician Assistant

## 2022-10-31 ENCOUNTER — Telehealth: Payer: Self-pay

## 2022-10-31 ENCOUNTER — Encounter: Payer: Self-pay | Admitting: Physician Assistant

## 2022-10-31 VITALS — BP 119/81 | HR 53 | Ht 74.0 in | Wt 262.8 lb

## 2022-10-31 DIAGNOSIS — Z79899 Other long term (current) drug therapy: Secondary | ICD-10-CM

## 2022-10-31 DIAGNOSIS — Z111 Encounter for screening for respiratory tuberculosis: Secondary | ICD-10-CM

## 2022-10-31 DIAGNOSIS — M19042 Primary osteoarthritis, left hand: Secondary | ICD-10-CM

## 2022-10-31 DIAGNOSIS — M1711 Unilateral primary osteoarthritis, right knee: Secondary | ICD-10-CM | POA: Diagnosis not present

## 2022-10-31 DIAGNOSIS — Z820 Family history of epilepsy and other diseases of the nervous system: Secondary | ICD-10-CM

## 2022-10-31 DIAGNOSIS — M19041 Primary osteoarthritis, right hand: Secondary | ICD-10-CM

## 2022-10-31 DIAGNOSIS — G8929 Other chronic pain: Secondary | ICD-10-CM

## 2022-10-31 DIAGNOSIS — Z8669 Personal history of other diseases of the nervous system and sense organs: Secondary | ICD-10-CM

## 2022-10-31 DIAGNOSIS — Z8679 Personal history of other diseases of the circulatory system: Secondary | ICD-10-CM

## 2022-10-31 DIAGNOSIS — M545 Low back pain, unspecified: Secondary | ICD-10-CM

## 2022-10-31 DIAGNOSIS — M19071 Primary osteoarthritis, right ankle and foot: Secondary | ICD-10-CM

## 2022-10-31 DIAGNOSIS — M5030 Other cervical disc degeneration, unspecified cervical region: Secondary | ICD-10-CM

## 2022-10-31 DIAGNOSIS — Z8659 Personal history of other mental and behavioral disorders: Secondary | ICD-10-CM

## 2022-10-31 DIAGNOSIS — M0579 Rheumatoid arthritis with rheumatoid factor of multiple sites without organ or systems involvement: Secondary | ICD-10-CM | POA: Diagnosis not present

## 2022-10-31 DIAGNOSIS — Z87891 Personal history of nicotine dependence: Secondary | ICD-10-CM

## 2022-10-31 DIAGNOSIS — M19072 Primary osteoarthritis, left ankle and foot: Secondary | ICD-10-CM

## 2022-10-31 DIAGNOSIS — M7541 Impingement syndrome of right shoulder: Secondary | ICD-10-CM

## 2022-10-31 DIAGNOSIS — Z96652 Presence of left artificial knee joint: Secondary | ICD-10-CM

## 2022-10-31 NOTE — Patient Instructions (Signed)
Standing Labs We placed an order today for your standing lab work.   Please have your standing labs drawn in March and every 3 months  Please have your labs drawn 2 weeks prior to your appointment so that the provider can discuss your lab results at your appointment.  Please note that you may see your imaging and lab results in MyChart before we have reviewed them. We will contact you once all results are reviewed. Please allow our office up to 72 hours to thoroughly review all of the results before contacting the office for clarification of your results.  Lab hours are:   Monday through Thursday from 8:00 am -12:30 pm and 1:00 pm-5:00 pm and Friday from 8:00 am-12:00 pm.  Please be advised, all patients with office appointments requiring lab work will take precedent over walk-in lab work.   Labs are drawn by [facility]. Please bring your co-pay at the time of your lab draw.  You may receive a bill from [facility] for your lab work.  Please note if you are on Hydroxychloroquine and and an order has been placed for a Hydroxychloroquine level, you will need to have it drawn 4 hours or more after your last dose.  If you wish to have your labs drawn at another location, please call the office 24 hours in advance so we can fax the orders.  The office is located at [address], [address], [place], [place] [address] No appointment is necessary.    If you have any questions regarding directions or hours of operation,  please call [phone].   As a reminder, please drink plenty of water prior to coming for your lab work. Thanks!  

## 2022-10-31 NOTE — Telephone Encounter (Signed)
TB Gold lab order has been faxed to PCPs office to have drawn with upcoming labs.

## 2022-11-06 ENCOUNTER — Other Ambulatory Visit (HOSPITAL_COMMUNITY): Payer: Self-pay

## 2022-11-20 ENCOUNTER — Other Ambulatory Visit (HOSPITAL_COMMUNITY): Payer: Self-pay

## 2022-11-20 ENCOUNTER — Telehealth: Payer: Self-pay

## 2022-11-20 NOTE — Telephone Encounter (Signed)
Received notification from  [place]  regarding a prior authorization for [facility]. Authorization has been APPROVED from 09/24/2022 to 11/20/2023. Approval letter sent to scan center.  Patient can continue to fill through [facility]: [phone]   Authorization # [id number]  Knox Saliva, PharmD, MPH, BCPS, CPP Clinical Pharmacist (Rheumatology and Pulmonology)

## 2022-11-20 NOTE — Telephone Encounter (Signed)
Per Rinaldo Ratel, current Prior Authorization is expiring.  Submitted a Prior Authorization request to [facility] for [facility] via CoverMyMeds. Will update once we receive a response.   Key: [id number]

## 2022-11-28 ENCOUNTER — Other Ambulatory Visit (HOSPITAL_COMMUNITY): Payer: Self-pay

## 2022-12-04 ENCOUNTER — Other Ambulatory Visit (HOSPITAL_COMMUNITY): Payer: Self-pay

## 2022-12-20 ENCOUNTER — Telehealth: Payer: Self-pay | Admitting: *Deleted

## 2022-12-20 NOTE — Telephone Encounter (Signed)
Attempted to contact the patient and left message for patient to call the office.  

## 2022-12-20 NOTE — Telephone Encounter (Signed)
Patient contacted the office and left message stating he was due to have surgery but the surgeon will not schedule it due to him having Stage 3 kidney disease. Patient would like to know if the Orencia could be contributing to this.   10/06/21: Creat 1.5 12/11/21: Creat 1.77 04/06/22: Creat. 1.5 09/07/22 Creat. 1.7  Please advise

## 2022-12-20 NOTE — Telephone Encounter (Signed)
Orencia should not be contributing to elevated creatinine.  However, he is on multiple medications and should discuss elevated creatinine with his PCP.  We can refer him to nephrology or he can be referred through his PCP.

## 2022-12-20 NOTE — Telephone Encounter (Signed)
Patient returned call to the office and left message.  Returned call to patient and advised Maureen Chatters should not be contributing to elevated creatinine. However, he is on multiple medications and should discuss elevated creatinine with his PCP. We can refer him to nephrology or he can be referred through his PCP. Patient expressed understanding.

## 2022-12-26 ENCOUNTER — Other Ambulatory Visit: Payer: Self-pay | Admitting: Physician Assistant

## 2022-12-26 ENCOUNTER — Other Ambulatory Visit: Payer: Self-pay

## 2022-12-26 ENCOUNTER — Other Ambulatory Visit (HOSPITAL_COMMUNITY): Payer: Self-pay

## 2022-12-26 DIAGNOSIS — Z9225 Personal history of immunosupression therapy: Secondary | ICD-10-CM

## 2022-12-26 DIAGNOSIS — Z79899 Other long term (current) drug therapy: Secondary | ICD-10-CM

## 2022-12-26 DIAGNOSIS — M0579 Rheumatoid arthritis with rheumatoid factor of multiple sites without organ or systems involvement: Secondary | ICD-10-CM

## 2022-12-26 DIAGNOSIS — Z111 Encounter for screening for respiratory tuberculosis: Secondary | ICD-10-CM

## 2022-12-26 MED ORDER — ORENCIA CLICKJECT 125 MG/ML ~~LOC~~ SOAJ
125.0000 mg | SUBCUTANEOUS | 0 refills | Status: DC
  Filled 2022-12-26: qty 4, 28d supply, fill #0

## 2022-12-26 NOTE — Telephone Encounter (Signed)
Last Fill: 10/12/2022  Labs: 09/07/2022 Triglycerides 152   Calc Osm 303  TB Gold: 12/11/2021 Neg    Next Visit: 04/23/2023  Last Visit: 10/31/2022  DX: Rheumatoid arthritis with rheumatoid factor of multiple sites without organ or systems involvement   Current Dose per office note 10/31/2022: Orencia 125 mg sq injections once weekly.   Patient advised he is due for labs. Patient requested his orders be faxed to his PCP and he will update them tomorrow.   Okay to refill Orencia?

## 2022-12-28 ENCOUNTER — Other Ambulatory Visit (HOSPITAL_COMMUNITY): Payer: Self-pay

## 2022-12-28 ENCOUNTER — Other Ambulatory Visit: Payer: Self-pay | Admitting: *Deleted

## 2022-12-28 DIAGNOSIS — Z111 Encounter for screening for respiratory tuberculosis: Secondary | ICD-10-CM

## 2022-12-28 DIAGNOSIS — Z79899 Other long term (current) drug therapy: Secondary | ICD-10-CM

## 2022-12-28 DIAGNOSIS — Z9225 Personal history of immunosupression therapy: Secondary | ICD-10-CM

## 2022-12-28 LAB — COMPLETE METABOLIC PANEL WITH GFR: EGFR: 53

## 2023-01-02 ENCOUNTER — Other Ambulatory Visit (HOSPITAL_COMMUNITY): Payer: Self-pay

## 2023-01-19 HISTORY — PX: REPLACEMENT TOTAL KNEE: SUR1224

## 2023-01-24 ENCOUNTER — Other Ambulatory Visit: Payer: Self-pay | Admitting: Rheumatology

## 2023-01-24 ENCOUNTER — Telehealth: Payer: Self-pay | Admitting: Rheumatology

## 2023-01-24 ENCOUNTER — Other Ambulatory Visit (HOSPITAL_COMMUNITY): Payer: Self-pay

## 2023-01-24 DIAGNOSIS — Z79899 Other long term (current) drug therapy: Secondary | ICD-10-CM

## 2023-01-24 DIAGNOSIS — M0579 Rheumatoid arthritis with rheumatoid factor of multiple sites without organ or systems involvement: Secondary | ICD-10-CM

## 2023-01-24 NOTE — Telephone Encounter (Signed)
Spoke with patient and advised we declined his prescription because we do not have any recent labs. Patient advised we will need lab prior to refill his medication. Patient states he believes he had labs with his PCP recently. Advised patient if he can have those sent to [place] then we will be able to send in his prescription. Patient expressed understanding.

## 2023-01-24 NOTE — Telephone Encounter (Signed)
Patient called stating he received a call from the pharmacy that his refill of Orencia had been declined and to contact the office.  Patient requested a return call.

## 2023-01-28 ENCOUNTER — Other Ambulatory Visit: Payer: Self-pay | Admitting: Rheumatology

## 2023-01-28 ENCOUNTER — Telehealth: Payer: Self-pay | Admitting: *Deleted

## 2023-01-28 ENCOUNTER — Other Ambulatory Visit: Payer: Self-pay

## 2023-01-28 ENCOUNTER — Other Ambulatory Visit (HOSPITAL_COMMUNITY): Payer: Self-pay

## 2023-01-28 DIAGNOSIS — Z79899 Other long term (current) drug therapy: Secondary | ICD-10-CM

## 2023-01-28 DIAGNOSIS — M0579 Rheumatoid arthritis with rheumatoid factor of multiple sites without organ or systems involvement: Secondary | ICD-10-CM

## 2023-01-28 MED ORDER — ORENCIA CLICKJECT 125 MG/ML ~~LOC~~ SOAJ
125.0000 mg | SUBCUTANEOUS | 0 refills | Status: DC
Start: 2023-01-28 — End: 2023-04-16
  Filled 2023-01-28 – 2023-01-29 (×2): qty 12, 84d supply, fill #0

## 2023-01-28 NOTE — Telephone Encounter (Signed)
Last Fill: 12/26/2022 (30 day supply)  Labs: 12/28/2022 WBC 3.6, Hgb 13.4, Hct 40.8, Lymph Absolute 1.0, monocyte Man 11, Glucose 104, Creat. 1.5, GFR 53  TB Gold: 12/28/2022 Neg    Next Visit: 04/23/2023  Last Visit: 10/31/2022  DX: Rheumatoid arthritis with rheumatoid factor of multiple sites without organ or systems involvement   Current Dose per office note 10/31/2022: Orencia 125 mg sq injections once weekly.   Okay to refill Orencia?

## 2023-01-28 NOTE — Telephone Encounter (Signed)
Labs received from:Dr. Vernell Leep  Drawn on:12/28/2022  Reviewed by:Dr. Pollyann Savoy   Labs drawn:CBC, CMP, TB Gold  Results:WBC 3.6  Hgb 13.4  Hct 40.8  Lymph Absolute 1.0  Monocyte Man 11  Glucose 104  Creat. 1.5  GFR 53  TB Gold Negative  Patient is on Orencia 125 mg DQ weekly.   Decreased WBC, recheck every 3 months.

## 2023-01-29 ENCOUNTER — Other Ambulatory Visit (HOSPITAL_COMMUNITY): Payer: Self-pay

## 2023-01-29 ENCOUNTER — Other Ambulatory Visit: Payer: Self-pay

## 2023-02-14 ENCOUNTER — Other Ambulatory Visit (HOSPITAL_COMMUNITY): Payer: Self-pay

## 2023-04-09 NOTE — Progress Notes (Signed)
Office Visit Note  Patient: Dylan Dudley             Date of Birth: 06-May-1961           MRN: [id number]             PCP: Elise Benne, MD Referring: Elise Benne, MD Visit Date: 04/23/2023 Occupation: @GUAROCC @  Subjective:  Right knee pain  History of Present Illness: Dylan Dudley is a 62 y.o. male with seropositive rheumatoid arthritis and osteoarthritis.  He had a right total knee replacement in April 2024 in [place], [place].  He states he had to come off Orencia subcu injections for about a month and resumed Orencia at the end of May.  He did not develop rheumatoid arthritis flare while he was off Orencia.  He is gradually recovering from the right knee surgery.  He states he is unable to walk as much as he was walking prior to the surgery.  [facility] on a regular basis.  He has morning stiffness lasting for about 10 minutes.  He has difficulty getting out of the chair and climbing stairs currently.  He continues to have some discomfort in his right shoulder joint.  His foot discomfort has improved compared to the last visit.    Activities of Daily Living:  Patient reports morning stiffness for 10 minutes.   Patient Denies nocturnal pain.  Difficulty dressing/grooming: Denies Difficulty climbing stairs: Reports Difficulty getting out of chair: Reports Difficulty using hands for taps, buttons, cutlery, and/or writing: Denies  Review of Systems  Constitutional:  Negative for fatigue.  HENT:  Negative for mouth sores and mouth dryness.   Eyes:  Negative for dryness.  Respiratory:  Negative for shortness of breath.   Cardiovascular:  Negative for chest pain and palpitations.  Gastrointestinal:  Negative for blood in stool, constipation and diarrhea.  Endocrine: Positive for increased urination.  Genitourinary:  Negative for involuntary urination.  Musculoskeletal:  Positive for joint pain, joint pain, joint  swelling and morning stiffness. Negative for gait problem, myalgias, muscle weakness, muscle tenderness and myalgias.  Skin:  Negative for color change, rash and sensitivity to sunlight.  Allergic/Immunologic: Negative for susceptible to infections.  Neurological:  Negative for dizziness and headaches.  Hematological:  Negative for swollen glands.  Psychiatric/Behavioral:  Positive for depressed mood. Negative for sleep disturbance. The patient is nervous/anxious.     PMFS History:  Patient Active Problem List   Diagnosis Date Noted   S/P TKR (total knee replacement), left 12/13/2020   Osteoarthritis of left knee 11/28/2020   Rheumatoid arthritis with rheumatoid factor of multiple sites without organ or systems involvement (HCC) 09/10/2016   Primary osteoarthritis of both hands 09/10/2016   Primary osteoarthritis of both knees 09/10/2016   Primary osteoarthritis of both feet 09/10/2016   DJD (degenerative joint disease), cervical 09/10/2016   History of tremor 09/10/2016   History of sleep apnea 09/10/2016   Coronary artery disease involving native heart 09/10/2016   Smoker 09/10/2016   High risk medication use 09/10/2016   Tear of right rotator cuff 03/14/2012   Impingement syndrome of right shoulder 03/14/2012   AC (acromioclavicular) arthritis, right 03/14/2012   Disorder of tendon of biceps 03/14/2012    Past Medical History:  Diagnosis Date   AC (acromioclavicular) arthritis 03/14/2012   Anxiety    Arthritis    "shoulders; knees" (07/17/2013)   Chronic bronchitis (HCC)    Congestive  heart failure (CHF) (HCC)    per patient    DDD (degenerative disc disease)    Depression    Disorder of tendon of biceps 03/14/2012   GERD (gastroesophageal reflux disease)    Hyperlipemia    Hypertension    Impingement syndrome of right shoulder 03/14/2012   Pneumonia 2012   "once" (07/17/2013)   Rheumatoid arthritis (HCC)    Seasonal allergies    Sleep apnea    CPAP    Tear of right  rotator cuff 03/14/2012    Family History  Problem Relation Age of Onset   Breast cancer Mother    Hypertension Mother    Hypertension Father    Multiple sclerosis Sister    Past Surgical History:  Procedure Laterality Date   ANTERIOR CERVICAL DECOMP/DISCECTOMY FUSION  08/25/2011   [place]   BUNIONECTOMY Bilateral 09/24/1996   DEBRIDEMENT AND CLOSURE WOUND  12/23/2012   "back of my neck" (07/17/2013)   DEBRIDEMENT AND CLOSURE WOUND N/A 07/17/2013   Procedure: DEBRIDEMENT  OF SKIN, SUBCUTANEOUS TISSUE BONE, MUSCLE FLAP, AND CLOSURE WOUND;  Surgeon: Glenna Fellows, MD;  Location: [facility];  Service: Plastics;  Laterality: N/A;   EXCISIONAL HEMORRHOIDECTOMY  ~ 1996   INCISION AND DRAINAGE OF WOUND  10/2012 X 2   "back of my neck" (07/17/2013)   MUSCLE FLAP CLOSURE  07/17/2013   "to back of my neck" (07/17/2013)   NECK SURGERY  09/24/2012   "spur removed from back of my neck" (07/17/2013)   REPLACEMENT TOTAL KNEE Right 01/19/2023   SHOULDER ARTHROSCOPY Left 09/24/2006   SHOULDER ARTHROSCOPY W/ ROTATOR CUFF REPAIR Right 09/25/2011   "and bicep repair" (07/17/2013)   TONSILLECTOMY  ~ 1974   TOTAL KNEE ARTHROPLASTY Left 12/13/2020   Procedure: TOTAL KNEE ARTHROPLASTY;  Surgeon: Teryl Lucy, MD;  Location: [facility];  Service: Orthopedics;  Laterality: Left;   Social History   Social History Narrative   Not on file   Immunization History  Administered Date(s) Administered   Influenza,inj,Quad PF,6+ Mos 07/18/2013   Moderna Sars-Covid-2 Vaccination 10/31/2019, 11/28/2019, 07/22/2020   Pneumococcal Polysaccharide-23 07/18/2013   Tdap 11/28/2006, 04/30/2014     Objective: Vital Signs: BP 121/87 (BP Location: Left Arm, Patient Position: Sitting, Cuff Size: Large)   Pulse (!) 54   Resp 18   Ht 6\' 2"  (1.88 m)   Wt 258 lb (117 kg)   BMI 33.13 kg/m    Physical Exam Vitals and nursing note reviewed.  Constitutional:      Appearance: He is well-developed.  HENT:     Head:  Normocephalic and atraumatic.  Eyes:     Conjunctiva/sclera: Conjunctivae normal.     Pupils: Pupils are equal, round, and reactive to light.  Cardiovascular:     Rate and Rhythm: Normal rate and regular rhythm.     Heart sounds: Normal heart sounds.  Pulmonary:     Effort: Pulmonary effort is normal.     Breath sounds: Normal breath sounds.  Abdominal:     General: Bowel sounds are normal.     Palpations: Abdomen is soft.  Musculoskeletal:     Cervical back: Normal range of motion and neck supple.  Skin:    General: Skin is warm and dry.     Capillary Refill: Capillary refill takes less than 2 seconds.  Neurological:     Mental Status: He is alert and oriented to person, place, and time.  Psychiatric:        Behavior: Behavior normal.  Musculoskeletal Exam: Cervical, thoracic and lumbar spine were in good range of motion.  Shoulder joints, elbow joints, wrist joints, MCPs PIPs and DIPs with good range of motion with no synovitis.  Hip joints were in good range of motion.  Right knee joint was replaced and warm to touch.  Left knee joint was in good range of motion which was also replaced.  There was no tenderness over ankles or MTPs.  CDAI Exam: CDAI Score: 6  Patient Global: 30 / 100; Provider Global: 10 / 100 Swollen: 1 ; Tender: 1  Joint Exam 04/23/2023      Right  Left  Knee  Swollen Tender        Investigation: No additional findings.  Imaging: No results found.  Recent Labs: Lab Results  Component Value Date   WBC 4.0 12/11/2021   HGB 13.5 12/11/2021   PLT 216 12/11/2021   NA 138 12/11/2021   K 4.2 12/11/2021   CL 102 12/11/2021   CO2 27 12/11/2021   GLUCOSE 104 (H) 12/11/2021   BUN 28 (H) 12/11/2021   CREATININE 1.77 (H) 12/11/2021   BILITOT 0.5 12/11/2021   ALKPHOS 68 01/10/2017   AST 19 12/11/2021   ALT 18 12/11/2021   PROT 6.9 12/11/2021   ALBUMIN 4.6 01/10/2017   CALCIUM 9.8 12/11/2021   GFRAA 79 12/17/2019   QFTBGOLDPLUS NEGATIVE  12/11/2021     Speciality Comments: PLQ Eye Exam: 11/19/2018 WNL @ [facility] Follow up in 1 year  Procedures:  No procedures performed Allergies: Codeine, Daptomycin, Hydroxyzine, Latex, and Vancomycin   Assessment / Plan:     Visit Diagnoses: Rheumatoid arthritis with rheumatoid factor of multiple sites without organ or systems involvement (HCC)-patient denies any increased joint pain or joint swelling except for his right knee after right total knee replacement in April 2024.  He had to stop Orencia for about a month and then resumed towards the end of May.  He denies having a flare of rheumatoid arthritis.  None of the joints are swollen or inflamed today except warmth in his right knee joint.  He denies shortness of breath or palpitations.  High risk medication use - Orencia 125 mg sq injections once weekly since February 2021.  Previous treatment :hydroxychloroquine, methotrexate and leflunomide.  Labs obtained on April 11, 2023 were reviewed.April 11, 2023 CBC WBC 4.7, hemoglobin 12.4, platelets 239, ESR 19, CMP creatinine 1.4, AST 21, ALT 19, LDL 52, December 28, 2022 TB Gold negative.  He was advised to get labs every 3 months which will include CBC with differential and CMP with GFR.  Next TB Gold will be in April 2025.  Information about immunization was placed in the AVS.  He was advised to hold Orencia if he develops an infection resume after the infection resolves.  Chronic pain in right shoulder -he has intermittent discomfort in his right shoulder joint.  He had good range of motion of the right shoulder today.  Arthroscopic surgery by Dr. Dion Saucier shoulder joint impingement x2  Primary osteoarthritis of both hands-he denies any discomfort in his hands.  No synovitis was noted.  Status post total knee replacement, right - April 2024 by Dr. Lance Sell in [place].  He has been going to water aerobics.  He had good range of motion of the right knee joint with some warmth and  swelling.  S/P TKR (total knee replacement), left -he had good range of motion without any discomfort.  Performed by Dr. Dion Saucier on 12/13/2020.  Primary osteoarthritis of both feet -he denies any discomfort today.  He has hammertoes .  He was evaluated by podiatry in the past.  DDD (degenerative disc disease), cervical-had good range of motion without discomfort.  Chronic midline low back pain without sciatica-has intermittent back pain.  Had good mobility without discomfort today.  Vitamin D deficiency-he takes vitamin D supplement.  Other medical problems are listed as follows:  History of CHF (congestive heart failure)-increased risk of heart disease with rheumatoid arthritis was discussed.  A handout on heart disease prevention was placed in the AVS.  History of coronary artery disease  History of tremor  History of depression  History of sleep apnea - on CPAP  Former smoker - 1/3 PPDx40 years, quit 2021.  Family history of multiple sclerosis-sister  Orders: No orders of the defined types were placed in this encounter.  No orders of the defined types were placed in this encounter.    Follow-Up Instructions: Return in about 5 months (around 09/23/2023) for Rheumatoid arthritis.   Pollyann Savoy, MD  Note - This record has been created using Animal nutritionist.  Chart creation errors have been sought, but may not always  have been located. Such creation errors do not reflect on  the standard of medical care.

## 2023-04-11 LAB — LAB REPORT - SCANNED: EGFR: 57

## 2023-04-16 ENCOUNTER — Telehealth: Payer: Self-pay | Admitting: *Deleted

## 2023-04-16 ENCOUNTER — Other Ambulatory Visit (HOSPITAL_COMMUNITY): Payer: Self-pay

## 2023-04-16 ENCOUNTER — Other Ambulatory Visit: Payer: Self-pay | Admitting: Physician Assistant

## 2023-04-16 DIAGNOSIS — Z79899 Other long term (current) drug therapy: Secondary | ICD-10-CM

## 2023-04-16 DIAGNOSIS — M0579 Rheumatoid arthritis with rheumatoid factor of multiple sites without organ or systems involvement: Secondary | ICD-10-CM

## 2023-04-16 MED ORDER — ORENCIA CLICKJECT 125 MG/ML ~~LOC~~ SOAJ
125.0000 mg | SUBCUTANEOUS | 0 refills | Status: DC
Start: 2023-04-16 — End: 2023-07-09
  Filled 2023-04-16: qty 12, 84d supply, fill #0

## 2023-04-16 NOTE — Telephone Encounter (Signed)
Last Fill: 01/28/2023  Labs: 04/11/2023 RBC 3.98, Hgb 12.4, Hct 37.8, ESR 19, Glucose 115, GFR 57  TB Gold:  12/28/2022 Neg    Next Visit: 04/23/2023   Last Visit: 10/31/2022   DX: Rheumatoid arthritis with rheumatoid factor of multiple sites without organ or systems involvement   Current Dose per office note 10/31/2022: Orencia 125 mg sq injections once weekly.   Okay to refill Orencia?

## 2023-04-16 NOTE — Telephone Encounter (Signed)
Labs received from:[facility]  Drawn on:04/11/2023  Reviewed by:Dr. Pollyann Savoy   Labs drawn: CBC, CMP, Sed Rate, Lipid Panel  Results: RBC 3.98   Hgb 12.4   Hct 37.8   ESR 19   Glucose 115   GFR 57  Patient is on Orencia 125 mg SQ weekly.

## 2023-04-19 ENCOUNTER — Other Ambulatory Visit: Payer: Self-pay

## 2023-04-23 ENCOUNTER — Encounter: Payer: Self-pay | Admitting: Rheumatology

## 2023-04-23 ENCOUNTER — Ambulatory Visit: Payer: Medicare Other | Attending: Rheumatology | Admitting: Rheumatology

## 2023-04-23 VITALS — BP 121/87 | HR 54 | Resp 18 | Ht 74.0 in | Wt 258.0 lb

## 2023-04-23 DIAGNOSIS — Z79899 Other long term (current) drug therapy: Secondary | ICD-10-CM | POA: Diagnosis not present

## 2023-04-23 DIAGNOSIS — Z8669 Personal history of other diseases of the nervous system and sense organs: Secondary | ICD-10-CM

## 2023-04-23 DIAGNOSIS — M19041 Primary osteoarthritis, right hand: Secondary | ICD-10-CM | POA: Diagnosis not present

## 2023-04-23 DIAGNOSIS — Z8679 Personal history of other diseases of the circulatory system: Secondary | ICD-10-CM

## 2023-04-23 DIAGNOSIS — Z96652 Presence of left artificial knee joint: Secondary | ICD-10-CM

## 2023-04-23 DIAGNOSIS — M25511 Pain in right shoulder: Secondary | ICD-10-CM | POA: Diagnosis not present

## 2023-04-23 DIAGNOSIS — Z820 Family history of epilepsy and other diseases of the nervous system: Secondary | ICD-10-CM

## 2023-04-23 DIAGNOSIS — M1711 Unilateral primary osteoarthritis, right knee: Secondary | ICD-10-CM

## 2023-04-23 DIAGNOSIS — M19072 Primary osteoarthritis, left ankle and foot: Secondary | ICD-10-CM

## 2023-04-23 DIAGNOSIS — M545 Low back pain, unspecified: Secondary | ICD-10-CM

## 2023-04-23 DIAGNOSIS — Z87891 Personal history of nicotine dependence: Secondary | ICD-10-CM

## 2023-04-23 DIAGNOSIS — M19042 Primary osteoarthritis, left hand: Secondary | ICD-10-CM

## 2023-04-23 DIAGNOSIS — Z8659 Personal history of other mental and behavioral disorders: Secondary | ICD-10-CM

## 2023-04-23 DIAGNOSIS — G8929 Other chronic pain: Secondary | ICD-10-CM

## 2023-04-23 DIAGNOSIS — M5030 Other cervical disc degeneration, unspecified cervical region: Secondary | ICD-10-CM

## 2023-04-23 DIAGNOSIS — Z96651 Presence of right artificial knee joint: Secondary | ICD-10-CM

## 2023-04-23 DIAGNOSIS — M0579 Rheumatoid arthritis with rheumatoid factor of multiple sites without organ or systems involvement: Secondary | ICD-10-CM

## 2023-04-23 DIAGNOSIS — E559 Vitamin D deficiency, unspecified: Secondary | ICD-10-CM

## 2023-04-23 DIAGNOSIS — M7541 Impingement syndrome of right shoulder: Secondary | ICD-10-CM

## 2023-04-23 DIAGNOSIS — M19071 Primary osteoarthritis, right ankle and foot: Secondary | ICD-10-CM

## 2023-04-23 NOTE — Patient Instructions (Signed)
Standing Labs We placed an order today for your standing lab work.   Please have your standing labs drawn in October  Please have your labs drawn 2 weeks prior to your appointment so that the provider can discuss your lab results at your appointment, if possible.  Please note that you may see your imaging and lab results in MyChart before we have reviewed them. We will contact you once all results are reviewed. Please allow our office up to 72 hours to thoroughly review all of the results before contacting the office for clarification of your results.  WALK-IN LAB HOURS  Monday through Thursday from 8:00 am -12:30 pm and 1:00 pm-5:00 pm and Friday from 8:00 am-12:00 pm.  Patients with office visits requiring labs will be seen before walk-in labs.  You may encounter longer than normal wait times. Please allow additional time. Wait times may be shorter on  Monday and Thursday afternoons.  We do not book appointments for walk-in labs. We appreciate your patience and understanding with our staff.   Labs are drawn by [facility]. Please bring your co-pay at the time of your lab draw.  You may receive a bill from [facility] for your lab work.  Please note if you are on Hydroxychloroquine and and an order has been placed for a Hydroxychloroquine level,  you will need to have it drawn 4 hours or more after your last dose.  If you wish to have your labs drawn at another location, please call the office 24 hours in advance so we can fax the orders.  The office is located at [address], [address], [place], [place] [address]   If you have any questions regarding directions or hours of operation,  please call [phone].   As a reminder, please drink plenty of water prior to coming for your lab work. Thanks!   Vaccines You are taking a medication(s) that can suppress your immune system.  The following immunizations are recommended: Flu annually Covid-19  RSV Td/Tdap (tetanus, diphtheria, pertussis)  every 10 years Pneumonia (Prevnar 15 then Pneumovax 23 at least 1 year apart.  Alternatively, can take Prevnar 20 without needing additional dose) Shingrix: 2 doses from 4 weeks to 6 months apart  Please check with your PCP to make sure you are up to date.   If you have signs or symptoms of an infection or start antibiotics: First, call your PCP for workup of your infection. Hold your medication through the infection, until you complete your antibiotics, and until symptoms resolve if you take the following: Injectable medication (Actemra, Benlysta, Cimzia, Cosentyx, Enbrel, Humira, Kevzara, Orencia, Remicade, Simponi, Stelara, Taltz, Tremfya) Methotrexate Leflunomide (Arava) Mycophenolate (Cellcept) Harriette Ohara, Olumiant, or Rinvoq  Heart Disease Prevention   Your inflammatory disease increases your risk of heart disease which includes heart attack, stroke, atrial fibrillation (irregular heartbeats), high blood pressure, heart failure and atherosclerosis (plaque in the arteries).  It is important to reduce your risk by:   Keep blood pressure, cholesterol, and blood sugar at healthy levels   Smoking Cessation   Maintain a healthy weight  BMI 20-25   Eat a healthy diet  Plenty of fresh fruit, vegetables, and whole grains  Limit saturated fats, foods high in sodium, and added sugars  DASH and Mediterranean diet   Increase physical activity  Recommend moderate physically activity for 150 minutes per week/ 30 minutes a day for five days a week These can be broken up into three separate ten-minute sessions during the day.  Reduce Stress  Meditation, slow breathing exercises, yoga, coloring books  Dental visits twice a year

## 2023-07-09 ENCOUNTER — Other Ambulatory Visit: Payer: Self-pay

## 2023-07-09 ENCOUNTER — Other Ambulatory Visit: Payer: Self-pay | Admitting: Rheumatology

## 2023-07-09 DIAGNOSIS — Z79899 Other long term (current) drug therapy: Secondary | ICD-10-CM

## 2023-07-09 DIAGNOSIS — M0579 Rheumatoid arthritis with rheumatoid factor of multiple sites without organ or systems involvement: Secondary | ICD-10-CM

## 2023-07-09 MED ORDER — ORENCIA CLICKJECT 125 MG/ML ~~LOC~~ SOAJ
125.0000 mg | SUBCUTANEOUS | 0 refills | Status: DC
Start: 2023-07-09 — End: 2023-10-28
  Filled 2023-07-09: qty 12, 84d supply, fill #0

## 2023-07-09 NOTE — Progress Notes (Signed)
Specialty Pharmacy Refill Coordination Note  Dylan Dudley is a 62 y.o. male contacted today regarding refills of specialty medication(s) Abatacept   Patient requested Delivery   Delivery date: 07/17/23   Verified address: [address]   [name] [address]   Medication will be filled on 07/16/23 pending refill request.

## 2023-07-09 NOTE — Telephone Encounter (Signed)
Last Fill: 04/16/2023  Labs: 04/11/2023 RBC 3.98       Hgb 12.4       Hct 37.8       ESR 19      Glucose 115      GFR 57  TB Gold: 12/28/2022 Neg    Next Visit: 10/09/2023  Last Visit: 04/23/2023  DX: Rheumatoid arthritis with rheumatoid factor of multiple sites without organ or systems involvement   Current Dose per office note 04/23/2023: Orencia 125 mg sq injections once weekly   Okay to refill Orencia?

## 2023-07-16 ENCOUNTER — Other Ambulatory Visit: Payer: Self-pay

## 2023-09-25 NOTE — Progress Notes (Deleted)
 Office Visit Note  Patient: Dylan Dudley             Date of Birth: 10/12/1960           MRN: [id number]             PCP: Eilene Grater, MD Referring: Eilene Grater, MD Visit Date: 10/09/2023 Occupation: @GUAROCC @  Subjective:  No chief complaint on file.   History of Present Illness: Dylan Dudley is a 63 y.o. male ***     Activities of Daily Living:  Patient reports morning stiffness for *** {minute/hour:19697}.   Patient {ACTIONS;DENIES/REPORTS:21021675::"Denies"} nocturnal pain.  Difficulty dressing/grooming: {ACTIONS;DENIES/REPORTS:21021675::"Denies"} Difficulty climbing stairs: {ACTIONS;DENIES/REPORTS:21021675::"Denies"} Difficulty getting out of chair: {ACTIONS;DENIES/REPORTS:21021675::"Denies"} Difficulty using hands for taps, buttons, cutlery, and/or writing: {ACTIONS;DENIES/REPORTS:21021675::"Denies"}  No Rheumatology ROS completed.   PMFS History:  Patient Active Problem List   Diagnosis Date Noted  . S/P TKR (total knee replacement), left 12/13/2020  . Osteoarthritis of left knee 11/28/2020  . Rheumatoid arthritis with rheumatoid factor of multiple sites without organ or systems involvement (HCC) 09/10/2016  . Primary osteoarthritis of both hands 09/10/2016  . Primary osteoarthritis of both knees 09/10/2016  . Primary osteoarthritis of both feet 09/10/2016  . DJD (degenerative joint disease), cervical 09/10/2016  . History of tremor 09/10/2016  . History of sleep apnea 09/10/2016  . Coronary artery disease involving native heart 09/10/2016  . Smoker 09/10/2016  . High risk medication use 09/10/2016  . Tear of right rotator cuff 03/14/2012  . Impingement syndrome of right shoulder 03/14/2012  . AC (acromioclavicular) arthritis, right 03/14/2012  . Disorder of tendon of biceps 03/14/2012    Past Medical History:  Diagnosis Date  . AC (acromioclavicular) arthritis 03/14/2012  . Anxiety   . Arthritis    "shoulders; knees"  (07/17/2013)  . Chronic bronchitis (HCC)   . Congestive heart failure (CHF) (HCC)    per patient   . DDD (degenerative disc disease)   . Depression   . Disorder of tendon of biceps 03/14/2012  . GERD (gastroesophageal reflux disease)   . Hyperlipemia   . Hypertension   . Impingement syndrome of right shoulder 03/14/2012  . Pneumonia 2012   "once" (07/17/2013)  . Rheumatoid arthritis (HCC)   . Seasonal allergies   . Sleep apnea    CPAP   . Tear of right rotator cuff 03/14/2012    Family History  Problem Relation Age of Onset  . Breast cancer Mother   . Hypertension Mother   . Hypertension Father   . Multiple sclerosis Sister    Past Surgical History:  Procedure Laterality Date  . ANTERIOR CERVICAL DECOMP/DISCECTOMY FUSION  08/25/2011   [place]  . BUNIONECTOMY Bilateral 09/24/1996  . DEBRIDEMENT AND CLOSURE WOUND  12/23/2012   "back of my neck" (07/17/2013)  . DEBRIDEMENT AND CLOSURE WOUND N/A 07/17/2013   Procedure: DEBRIDEMENT  OF SKIN, SUBCUTANEOUS TISSUE BONE, MUSCLE FLAP, AND CLOSURE WOUND;  Surgeon: Alger Infield, MD;  Location: [facility];  Service: Plastics;  Laterality: N/A;  . EXCISIONAL HEMORRHOIDECTOMY  ~ 1996  . INCISION AND DRAINAGE OF WOUND  10/2012 X 2   "back of my neck" (07/17/2013)  . MUSCLE FLAP CLOSURE  07/17/2013   "to back of my neck" (07/17/2013)  . NECK SURGERY  09/24/2012   "spur removed from back of my neck" (07/17/2013)  . REPLACEMENT TOTAL KNEE Right 01/19/2023  . SHOULDER ARTHROSCOPY Left 09/24/2006  . SHOULDER ARTHROSCOPY W/ ROTATOR CUFF REPAIR Right 09/25/2011   "and  bicep repair" (07/17/2013)  . TONSILLECTOMY  ~ 1974  . TOTAL KNEE ARTHROPLASTY Left 12/13/2020   Procedure: TOTAL KNEE ARTHROPLASTY;  Surgeon: Osa Blase, MD;  Location: [facility];  Service: Orthopedics;  Laterality: Left;   Social History   Social History Narrative  . Not on file   Immunization History  Administered Date(s) Administered  . Influenza,inj,Quad PF,6+ Mos  07/18/2013  . Moderna Covid-19 Vaccine Bivalent Booster 21yrs & up 06/22/2021  . Moderna Sars-Covid-2 Vaccination 10/31/2019, 11/28/2019, 07/22/2020  . Pneumococcal Polysaccharide-23 07/18/2013  . Tdap 11/28/2006, 04/30/2014     Objective: Vital Signs: There were no vitals taken for this visit.   Physical Exam   Musculoskeletal Exam: ***  CDAI Exam: CDAI Score: -- Patient Global: --; Provider Global: -- Swollen: --; Tender: -- Joint Exam 10/09/2023   No joint exam has been documented for this visit   There is currently no information documented on the homunculus. Go to the Rheumatology activity and complete the homunculus joint exam.  Investigation: No additional findings.  Imaging: No results found.  Recent Labs: Lab Results  Component Value Date   WBC 4.0 12/11/2021   HGB 13.5 12/11/2021   PLT 216 12/11/2021   NA 138 12/11/2021   K 4.2 12/11/2021   CL 102 12/11/2021   CO2 27 12/11/2021   GLUCOSE 104 (H) 12/11/2021   BUN 28 (H) 12/11/2021   CREATININE 1.77 (H) 12/11/2021   BILITOT 0.5 12/11/2021   ALKPHOS 68 01/10/2017   AST 19 12/11/2021   ALT 18 12/11/2021   PROT 6.9 12/11/2021   ALBUMIN 4.6 01/10/2017   CALCIUM  9.8 12/11/2021   GFRAA 79 12/17/2019   QFTBGOLDPLUS NEGATIVE 12/11/2021    Speciality Comments: PLQ Eye Exam: 11/19/2018 WNL @ [facility] Follow up in 1 year  Procedures:  No procedures performed Allergies: Codeine, Daptomycin, Hydroxyzine, Latex, and Vancomycin   Assessment / Plan:     Visit Diagnoses: No diagnosis found.  Orders: No orders of the defined types were placed in this encounter.  No orders of the defined types were placed in this encounter.   Face-to-face time spent with patient was *** minutes. Greater than 50% of time was spent in counseling and coordination of care.  Follow-Up Instructions: No follow-ups on file.   Nicholas Bari, MD  Note - This record has been created using Animal nutritionist.  Chart  creation errors have been sought, but may not always  have been located. Such creation errors do not reflect on  the standard of medical care.

## 2023-09-26 ENCOUNTER — Other Ambulatory Visit: Payer: Self-pay

## 2023-09-30 ENCOUNTER — Other Ambulatory Visit: Payer: Self-pay

## 2023-10-02 ENCOUNTER — Other Ambulatory Visit (HOSPITAL_COMMUNITY): Payer: Self-pay

## 2023-10-02 ENCOUNTER — Other Ambulatory Visit: Payer: Self-pay

## 2023-10-02 ENCOUNTER — Other Ambulatory Visit: Payer: Self-pay | Admitting: Rheumatology

## 2023-10-02 DIAGNOSIS — Z79899 Other long term (current) drug therapy: Secondary | ICD-10-CM

## 2023-10-02 DIAGNOSIS — M0579 Rheumatoid arthritis with rheumatoid factor of multiple sites without organ or systems involvement: Secondary | ICD-10-CM

## 2023-10-02 NOTE — Progress Notes (Signed)
 Specialty Pharmacy Refill Coordination Note  Dylan Dudley is a 63 y.o. male contacted today regarding refills of specialty medication(s) Abatacept  (Orencia  [employer])   Patient requested Delivery   Delivery date: 10/16/23   Verified address: [address]   [place] [address]   Medication will be filled on 10/15/23 pending a refill request.

## 2023-10-02 NOTE — Progress Notes (Signed)
 Specialty Pharmacy Ongoing Clinical Assessment Note  Dylan Dudley is a 63 y.o. male who is being followed by the specialty pharmacy service for RxSp Rheumatoid Arthritis   Patient's specialty medication(s) reviewed today: Abatacept  (Orencia  [employer])   Missed doses in the last 4 weeks: 0   Patient/Caregiver did not have any additional questions or concerns.   Therapeutic benefit summary: Patient is NOT achieving benefit   Adverse events/side effects summary: No adverse events/side effects   Patient's therapy is appropriate to: Continue    Goals Addressed             This Visit's Progress    Reduce signs and symptoms       Patient is not on track and no change. Patient will maintain adherence and be monitored by provider to determine if a change in treatment plan is warranted. Patient states that he has not seen any symptomatic improvement but that his provider says that it is working. He will have follow up appointment next week to assess if any changes are needed.          Follow up:  6 months  Kellianne Ek M Makayle Krahn Specialty Pharmacist

## 2023-10-09 ENCOUNTER — Telehealth: Payer: Self-pay | Admitting: Rheumatology

## 2023-10-09 ENCOUNTER — Ambulatory Visit: Payer: Medicare Other | Admitting: Rheumatology

## 2023-10-09 ENCOUNTER — Other Ambulatory Visit: Payer: Self-pay | Admitting: *Deleted

## 2023-10-09 DIAGNOSIS — Z87891 Personal history of nicotine dependence: Secondary | ICD-10-CM

## 2023-10-09 DIAGNOSIS — Z96652 Presence of left artificial knee joint: Secondary | ICD-10-CM

## 2023-10-09 DIAGNOSIS — Z8659 Personal history of other mental and behavioral disorders: Secondary | ICD-10-CM

## 2023-10-09 DIAGNOSIS — E559 Vitamin D deficiency, unspecified: Secondary | ICD-10-CM

## 2023-10-09 DIAGNOSIS — Z79899 Other long term (current) drug therapy: Secondary | ICD-10-CM

## 2023-10-09 DIAGNOSIS — M545 Low back pain, unspecified: Secondary | ICD-10-CM

## 2023-10-09 DIAGNOSIS — M19041 Primary osteoarthritis, right hand: Secondary | ICD-10-CM

## 2023-10-09 DIAGNOSIS — M0579 Rheumatoid arthritis with rheumatoid factor of multiple sites without organ or systems involvement: Secondary | ICD-10-CM

## 2023-10-09 DIAGNOSIS — Z8669 Personal history of other diseases of the nervous system and sense organs: Secondary | ICD-10-CM

## 2023-10-09 DIAGNOSIS — Z820 Family history of epilepsy and other diseases of the nervous system: Secondary | ICD-10-CM

## 2023-10-09 DIAGNOSIS — M19071 Primary osteoarthritis, right ankle and foot: Secondary | ICD-10-CM

## 2023-10-09 DIAGNOSIS — G8929 Other chronic pain: Secondary | ICD-10-CM

## 2023-10-09 DIAGNOSIS — M5030 Other cervical disc degeneration, unspecified cervical region: Secondary | ICD-10-CM

## 2023-10-09 DIAGNOSIS — Z8679 Personal history of other diseases of the circulatory system: Secondary | ICD-10-CM

## 2023-10-09 DIAGNOSIS — Z96651 Presence of right artificial knee joint: Secondary | ICD-10-CM

## 2023-10-09 NOTE — Telephone Encounter (Signed)
Lab Orders faxed

## 2023-10-09 NOTE — Telephone Encounter (Signed)
 Patient contacted the office requesting lab orders to be be release to Dr. Shelagh Derrick, PCP.   Patient plans to have labs on 10/10/23

## 2023-10-14 NOTE — Progress Notes (Unsigned)
Office Visit Note  Patient: Dylan Dudley             Date of Birth: Dec 11, 1960           MRN: [id number]             PCP: Elise Benne, MD Referring: Elise Benne, MD Visit Date: 10/28/2023 Occupation: @GUAROCC @  Subjective:  Medication monitoring  History of Present Illness: Dylan Dudley is a 63 y.o. male with history of seropositive rheumatoid arthritis  and osteoarthritis.  Patient remains on Orencia 125 mg sq injections once weekly since February 2021.  He is tolerating Arava without any side effects or injection site reactions.  Patient reports that he is due for his next shot of Orencia tomorrow but will require refill.  He denies any signs or symptoms of a rheumatoid arthritis flare.  His morning stiffness has been lasting for about 5 minutes to 10 minutes daily.  He has not had any nocturnal pain or difficulty with ADLs.  He has intermittent discomfort in his knee joints but has gradually started to start walking more.  He denies any joint swelling at this time.  Activities of Daily Living:  Patient reports morning stiffness for 5-10 minutes.   Patient Denies nocturnal pain.  Difficulty dressing/grooming: Denies Difficulty climbing stairs: Denies Difficulty getting out of chair: Reports Difficulty using hands for taps, buttons, cutlery, and/or writing: Denies  Review of Systems  Constitutional:  Negative for fatigue.  HENT:  Negative for mouth sores and mouth dryness.   Eyes:  Negative for dryness.  Respiratory:  Negative for shortness of breath.   Cardiovascular:  Negative for chest pain and palpitations.  Gastrointestinal:  Negative for blood in stool, constipation and diarrhea.  Endocrine: Negative for increased urination.  Genitourinary:  Negative for involuntary urination.  Musculoskeletal:  Positive for joint pain, joint pain, myalgias, muscle weakness, morning stiffness, muscle tenderness and myalgias. Negative for gait problem and joint  swelling.  Skin:  Negative for color change, rash, hair loss and sensitivity to sunlight.  Allergic/Immunologic: Negative for susceptible to infections.  Neurological:  Negative for dizziness and headaches.  Hematological:  Negative for swollen glands.  Psychiatric/Behavioral:  Positive for depressed mood. Negative for sleep disturbance. The patient is not nervous/anxious.     PMFS History:  Patient Active Problem List   Diagnosis Date Noted   S/P TKR (total knee replacement), left 12/13/2020   Osteoarthritis of left knee 11/28/2020   Rheumatoid arthritis with rheumatoid factor of multiple sites without organ or systems involvement (HCC) 09/10/2016   Primary osteoarthritis of both hands 09/10/2016   Primary osteoarthritis of both knees 09/10/2016   Primary osteoarthritis of both feet 09/10/2016   DJD (degenerative joint disease), cervical 09/10/2016   History of tremor 09/10/2016   History of sleep apnea 09/10/2016   Coronary artery disease involving native heart 09/10/2016   Smoker 09/10/2016   High risk medication use 09/10/2016   Tear of right rotator cuff 03/14/2012   Impingement syndrome of right shoulder 03/14/2012   AC (acromioclavicular) arthritis, right 03/14/2012   Disorder of tendon of biceps 03/14/2012    Past Medical History:  Diagnosis Date   AC (acromioclavicular) arthritis 03/14/2012   Anxiety    Arthritis    "shoulders; knees" (07/17/2013)   Chronic bronchitis (HCC)    Congestive heart failure (CHF) (HCC)    per patient    DDD (degenerative disc disease)    Depression    Disorder of tendon of  biceps 03/14/2012   GERD (gastroesophageal reflux disease)    Hyperlipemia    Hypertension    Impingement syndrome of right shoulder 03/14/2012   Pneumonia 2012   "once" (07/17/2013)   Rheumatoid arthritis (HCC)    Seasonal allergies    Sleep apnea    CPAP    Tear of right rotator cuff 03/14/2012    Family History  Problem Relation Age of Onset   Breast cancer  Mother    Hypertension Mother    Hypertension Father    Multiple sclerosis Sister    Past Surgical History:  Procedure Laterality Date   ANTERIOR CERVICAL DECOMP/DISCECTOMY FUSION  08/25/2011   [place]   BUNIONECTOMY Bilateral 09/24/1996   DEBRIDEMENT AND CLOSURE WOUND  12/23/2012   "back of my neck" (07/17/2013)   DEBRIDEMENT AND CLOSURE WOUND N/A 07/17/2013   Procedure: DEBRIDEMENT  OF SKIN, SUBCUTANEOUS TISSUE BONE, MUSCLE FLAP, AND CLOSURE WOUND;  Surgeon: Glenna Fellows, MD;  Location: [facility] OR;  Service: Plastics;  Laterality: N/A;   EXCISIONAL HEMORRHOIDECTOMY  ~ 1996   INCISION AND DRAINAGE OF WOUND  10/2012 X 2   "back of my neck" (07/17/2013)   MUSCLE FLAP CLOSURE  07/17/2013   "to back of my neck" (07/17/2013)   NECK SURGERY  09/24/2012   "spur removed from back of my neck" (07/17/2013)   REPLACEMENT TOTAL KNEE Right 01/19/2023   SHOULDER ARTHROSCOPY Left 09/24/2006   SHOULDER ARTHROSCOPY W/ ROTATOR CUFF REPAIR Right 09/25/2011   "and bicep repair" (07/17/2013)   TONSILLECTOMY  ~ 1974   TOTAL KNEE ARTHROPLASTY Left 12/13/2020   Procedure: TOTAL KNEE ARTHROPLASTY;  Surgeon: Teryl Lucy, MD;  Location: [facility];  Service: Orthopedics;  Laterality: Left;   Social History   Social History Narrative   Not on file   Immunization History  Administered Date(s) Administered   Influenza,inj,Quad PF,6+ Mos 07/18/2013   Moderna Covid-19 Fall Seasonal Vaccine 89yrs & older 07/29/2023   Moderna Covid-19 Vaccine Bivalent Booster 45yrs & up 06/22/2021   Moderna Sars-Covid-2 Vaccination 10/31/2019, 11/28/2019, 07/22/2020   Pneumococcal Polysaccharide-23 07/18/2013   Tdap 11/28/2006, 04/30/2014     Objective: Vital Signs: BP 114/77 (BP Location: Left Arm, Patient Position: Sitting, Cuff Size: Large)   Pulse (!) 56   Resp 14   Ht 6\' 2"  (1.88 m)   Wt 255 lb (115.7 kg)   BMI 32.74 kg/m    Physical Exam Vitals and nursing note reviewed.  Constitutional:       Appearance: He is well-developed.  HENT:     Head: Normocephalic and atraumatic.  Eyes:     Conjunctiva/sclera: Conjunctivae normal.     Pupils: Pupils are equal, round, and reactive to light.  Cardiovascular:     Rate and Rhythm: Normal rate and regular rhythm.     Heart sounds: Normal heart sounds.  Pulmonary:     Effort: Pulmonary effort is normal.     Breath sounds: Normal breath sounds.  Abdominal:     General: Bowel sounds are normal.     Palpations: Abdomen is soft.  Musculoskeletal:     Cervical back: Normal range of motion and neck supple.  Skin:    General: Skin is warm and dry.     Capillary Refill: Capillary refill takes less than 2 seconds.  Neurological:     Mental Status: He is alert and oriented to person, place, and time.  Psychiatric:        Behavior: Behavior normal.      Musculoskeletal Exam: C-spine,  thoracic spine, lumbar spine have good range of motion.  Shoulder joints, elbow joints, wrist joints, MCPs, PIPs, DIPs have good range of motion with no synovitis.  Hip joints have good range of motion with no groin pain.  Right knee replacement has mild warmth.  Left knee replacement has no warmth or effusion.  Ankle joints have good range of motion with no tenderness or joint swelling.  CDAI Exam: CDAI Score: -- Patient Global: --; Provider Global: -- Swollen: --; Tender: -- Joint Exam 10/28/2023   No joint exam has been documented for this visit   There is currently no information documented on the homunculus. Go to the Rheumatology activity and complete the homunculus joint exam.  Investigation: No additional findings.  Imaging: No results found.  Recent Labs: Lab Results  Component Value Date   WBC 4.0 12/11/2021   HGB 13.5 12/11/2021   PLT 216 12/11/2021   NA 138 12/11/2021   K 4.2 12/11/2021   CL 102 12/11/2021   CO2 27 12/11/2021   GLUCOSE 104 (H) 12/11/2021   BUN 28 (H) 12/11/2021   CREATININE 1.77 (H) 12/11/2021   BILITOT 0.5  12/11/2021   ALKPHOS 68 01/10/2017   AST 19 12/11/2021   ALT 18 12/11/2021   PROT 6.9 12/11/2021   ALBUMIN 4.6 01/10/2017   CALCIUM 9.8 12/11/2021   GFRAA 79 12/17/2019   QFTBGOLDPLUS NEGATIVE 12/11/2021    Speciality Comments: PLQ Eye Exam: 11/19/2018 [facility]NL @ [facility] Follow up in 1 year  Procedures:  No procedures performed Allergies: Codeine, Daptomycin, Hydroxyzine, Latex, and Vancomycin   Assessment / Plan:     Visit Diagnoses: Rheumatoid arthritis with rheumatoid factor of multiple sites without organ or systems involvement (HCC) - He has no joint tenderness or synovitis on examination today.  His morning stiffness has been lasting 5 to 10 minutes daily.  He has not had any nocturnal pain or difficulty with ADLs.  He has not had any signs or symptoms of a rheumatoid arthritis flare.  He is clinically been doing well on Orencia 125 mg sq injections once weekly.  He is due for his next dose of Orencia tomorrow but will require refill.  Patient had updated lab work on 10/18/2023 which was reviewed today in the office.  He will continue to require updated lab work every 3 months.  He is advised to notify us if he develops signs or symptoms of a flare.  He will follow-up in the office in 5 months or sooner if needed. Plan: Abatacept (ORENCIA CLICKJECT) 125 MG/ML SOAJ  High risk medication use - Orencia 125 mg sq injections once weekly since February 2021.  Previous treatment :hydroxychloroquine, methotrexate and leflunomide  CBC and CMP updated on 10/18/23.  His next lab work will be due in April and every 3 months to monitor for drug toxicity.  Standing orders for CBC and CMP remain in place. TB gold negative 12/28/22. Future order for TB gold placed today.  Discussed the importance of holding Orencia if he develops signs or symptoms of an infection and to resume once the infection has completely cleared. - Plan: QuantiFERON-TB Gold Plus, Abatacept (ORENCIA CLICKJECT) 125 MG/ML  SOAJ  Screening for tuberculosis - Future order for TB gold placed today.  Plan: QuantiFERON-TB Gold Plus  Chronic pain in right shoulder: Good ROM with no discomfort or tenderness upon palpation today.   Primary osteoarthritis of both hands: No synovitis noted.  Status post total knee replacement, right - April 2024  by Dr. Lance Sell in [place].  Mld warmth but no effusion.  He has started to walk for exercise.   S/P TKR (total knee replacement), left - Performed by Dr. Dion Saucier on 12/13/2020. No warmth or effusion.  Primary osteoarthritis of both feet: He is not experiencing any increased discomfort in his feet at this time.  DDD (degenerative disc disease), cervical: Good ROM with no discomfort at this time.   Chronic midline low back pain without sciatica: No symptoms of a radiculopathy   Other medical conditions are listed as follows:   Vitamin D deficiency: She is taking vitamin D 1000 units daily.   History of CHF (congestive heart failure)  History of coronary artery disease  History of tremor  History of depression  History of sleep apnea  Former smoker  Family history of multiple sclerosis-sister      Orders: Orders Placed This Encounter  Procedures   QuantiFERON-TB Gold Plus   Meds ordered this encounter  Medications   Abatacept (ORENCIA CLICKJECT) 125 MG/ML SOAJ    Sig: INJECT 125 MG INTO THE SKIN ONCE A WEEK.    Dispense:  12 mL    Refill:  0    .  Follow-Up Instructions: Return in about 5 months (around 03/26/2024) for Rheumatoid arthritis.   Gearldine Bienenstock, PA-C  Note - This record has been created using Dragon software.  Chart creation errors have been sought, but may not always  have been located. Such creation errors do not reflect on  the standard of medical care.

## 2023-10-18 LAB — LAB REPORT - SCANNED
EGFR: 65
PSA, Total: 0.25

## 2023-10-23 ENCOUNTER — Telehealth: Payer: Self-pay | Admitting: *Deleted

## 2023-10-23 ENCOUNTER — Other Ambulatory Visit (HOSPITAL_COMMUNITY): Payer: Self-pay

## 2023-10-23 NOTE — Telephone Encounter (Signed)
Labs received from: Dr. Radford Pax  Drawn on: 10/18/2023  Reviewed by: Sherron Ales, PA-C  Labs drawn: CBC, CMP, D Dimer, ESR, Lipid Panel. Cardia Markers, TSH  Results: RBC 4.11   Hgb 13.0   Hct 39.3   D Dimer (Quant) 3.61  Potassium 3.4  Patient is on Orencia 125 mg SQ once weekly.

## 2023-10-28 ENCOUNTER — Other Ambulatory Visit: Payer: Self-pay

## 2023-10-28 ENCOUNTER — Other Ambulatory Visit (HOSPITAL_COMMUNITY): Payer: Self-pay

## 2023-10-28 ENCOUNTER — Ambulatory Visit: Payer: Medicare Other | Attending: Physician Assistant | Admitting: Physician Assistant

## 2023-10-28 ENCOUNTER — Encounter: Payer: Self-pay | Admitting: Physician Assistant

## 2023-10-28 VITALS — BP 114/77 | HR 56 | Resp 14 | Ht 74.0 in | Wt 255.0 lb

## 2023-10-28 DIAGNOSIS — M19042 Primary osteoarthritis, left hand: Secondary | ICD-10-CM

## 2023-10-28 DIAGNOSIS — Z8669 Personal history of other diseases of the nervous system and sense organs: Secondary | ICD-10-CM

## 2023-10-28 DIAGNOSIS — M19041 Primary osteoarthritis, right hand: Secondary | ICD-10-CM | POA: Diagnosis not present

## 2023-10-28 DIAGNOSIS — Z8679 Personal history of other diseases of the circulatory system: Secondary | ICD-10-CM

## 2023-10-28 DIAGNOSIS — M25511 Pain in right shoulder: Secondary | ICD-10-CM | POA: Diagnosis not present

## 2023-10-28 DIAGNOSIS — M19071 Primary osteoarthritis, right ankle and foot: Secondary | ICD-10-CM

## 2023-10-28 DIAGNOSIS — Z79899 Other long term (current) drug therapy: Secondary | ICD-10-CM | POA: Diagnosis not present

## 2023-10-28 DIAGNOSIS — Z87891 Personal history of nicotine dependence: Secondary | ICD-10-CM

## 2023-10-28 DIAGNOSIS — E559 Vitamin D deficiency, unspecified: Secondary | ICD-10-CM

## 2023-10-28 DIAGNOSIS — M545 Low back pain, unspecified: Secondary | ICD-10-CM

## 2023-10-28 DIAGNOSIS — Z96651 Presence of right artificial knee joint: Secondary | ICD-10-CM

## 2023-10-28 DIAGNOSIS — Z8659 Personal history of other mental and behavioral disorders: Secondary | ICD-10-CM

## 2023-10-28 DIAGNOSIS — Z96652 Presence of left artificial knee joint: Secondary | ICD-10-CM

## 2023-10-28 DIAGNOSIS — Z111 Encounter for screening for respiratory tuberculosis: Secondary | ICD-10-CM

## 2023-10-28 DIAGNOSIS — M19072 Primary osteoarthritis, left ankle and foot: Secondary | ICD-10-CM

## 2023-10-28 DIAGNOSIS — M0579 Rheumatoid arthritis with rheumatoid factor of multiple sites without organ or systems involvement: Secondary | ICD-10-CM

## 2023-10-28 DIAGNOSIS — G8929 Other chronic pain: Secondary | ICD-10-CM

## 2023-10-28 DIAGNOSIS — M5030 Other cervical disc degeneration, unspecified cervical region: Secondary | ICD-10-CM

## 2023-10-28 DIAGNOSIS — Z820 Family history of epilepsy and other diseases of the nervous system: Secondary | ICD-10-CM

## 2023-10-28 MED ORDER — ORENCIA CLICKJECT 125 MG/ML ~~LOC~~ SOAJ
125.0000 mg | SUBCUTANEOUS | 0 refills | Status: DC
Start: 2023-10-28 — End: 2023-11-22
  Filled 2023-10-28: qty 4, 28d supply, fill #0

## 2023-10-28 NOTE — Patient Instructions (Signed)
 Standing Labs We placed an order today for your standing lab work.   Please have your standing labs drawn in April and every 3 months   Please have your labs drawn 2 weeks prior to your appointment so that the provider can discuss your lab results at your appointment, if possible.  Please note that you may see your imaging and lab results in MyChart before we have reviewed them. We will contact you once all results are reviewed. Please allow our office up to 72 hours to thoroughly review all of the results before contacting the office for clarification of your results.  WALK-IN LAB HOURS  Monday through Thursday from 8:00 am -12:30 pm and 1:00 pm-5:00 pm and Friday from 8:00 am-12:00 pm.  Patients with office visits requiring labs will be seen before walk-in labs.  You may encounter longer than normal wait times. Please allow additional time. Wait times may be shorter on  Monday and Thursday afternoons.  We do not book appointments for walk-in labs. We appreciate your patience and understanding with our staff.   Labs are drawn by [facility]. Please bring your co-pay at the time of your lab draw.  You may receive a bill from [facility] for your lab work.  Please note if you are on Hydroxychloroquine and and an order has been placed for a Hydroxychloroquine level,  you will need to have it drawn 4 hours or more after your last dose.  If you wish to have your labs drawn at another location, please call the office 24 hours in advance so we can fax the orders.  The office is located at [address], [address], [place], [place] [address]   If you have any questions regarding directions or hours of operation,  please call [phone].   As a reminder, please drink plenty of water prior to coming for your lab work. Thanks!

## 2023-10-28 NOTE — Progress Notes (Signed)
New rx has been sent in. Called & spoke with patient  about new Shipping date Ship 2/4 for 2/5 & also filling 1 month supply for $70 & aware 3 months was $210.00.

## 2023-10-29 ENCOUNTER — Other Ambulatory Visit: Payer: Self-pay

## 2023-11-19 ENCOUNTER — Other Ambulatory Visit: Payer: Self-pay

## 2023-11-19 NOTE — Progress Notes (Signed)
 Specialty Pharmacy Refill Coordination Note  Dylan Dudley is a 63 y.o. male contacted today regarding refills of specialty medication(s) Abatacept (Orencia ClickJect)   Patient requested Delivery   Delivery date: 11/22/23   Verified address: [address]   [name] [address]   Medication will be filled on 02.27.25.

## 2023-11-22 ENCOUNTER — Other Ambulatory Visit: Payer: Self-pay | Admitting: Pharmacist

## 2023-11-22 ENCOUNTER — Other Ambulatory Visit: Payer: Self-pay

## 2023-11-22 ENCOUNTER — Other Ambulatory Visit (HOSPITAL_COMMUNITY): Payer: Self-pay

## 2023-11-22 ENCOUNTER — Telehealth: Payer: Self-pay

## 2023-11-22 DIAGNOSIS — M0579 Rheumatoid arthritis with rheumatoid factor of multiple sites without organ or systems involvement: Secondary | ICD-10-CM

## 2023-11-22 DIAGNOSIS — Z79899 Other long term (current) drug therapy: Secondary | ICD-10-CM

## 2023-11-22 MED ORDER — ORENCIA CLICKJECT 125 MG/ML ~~LOC~~ SOAJ
125.0000 mg | SUBCUTANEOUS | 0 refills | Status: DC
Start: 2023-11-22 — End: 2024-02-05
  Filled 2023-11-22: qty 12, 84d supply, fill #0

## 2023-11-22 NOTE — Progress Notes (Signed)
 Updated delivery date 3.4.25. Approved for 3 month supply for $99.29 copay.

## 2023-11-22 NOTE — Telephone Encounter (Signed)
 Received notification from [facility] regarding a prior authorization for ORENCIA SQ. Authorization has been APPROVED from 09/25/2023 to 11/21/2024. Approval letter sent to scan center.  Authorization # [id number]

## 2023-11-22 NOTE — Telephone Encounter (Signed)
 Received notification from [facility] pharmacy that patient requires a new authorization for their medication.  Submitted an URGENT Prior Authorization request to [facility] for ORENCIA SQ via CoverMyMeds. Will update once we receive a response.  Key: [id number]

## 2023-11-25 ENCOUNTER — Other Ambulatory Visit: Payer: Self-pay

## 2024-02-05 ENCOUNTER — Other Ambulatory Visit (HOSPITAL_COMMUNITY): Payer: Self-pay

## 2024-02-05 ENCOUNTER — Other Ambulatory Visit: Payer: Self-pay | Admitting: *Deleted

## 2024-02-05 ENCOUNTER — Other Ambulatory Visit: Payer: Self-pay | Admitting: Pharmacy Technician

## 2024-02-05 ENCOUNTER — Other Ambulatory Visit: Payer: Self-pay | Admitting: Physician Assistant

## 2024-02-05 ENCOUNTER — Other Ambulatory Visit: Payer: Self-pay

## 2024-02-05 DIAGNOSIS — M0579 Rheumatoid arthritis with rheumatoid factor of multiple sites without organ or systems involvement: Secondary | ICD-10-CM

## 2024-02-05 DIAGNOSIS — Z79899 Other long term (current) drug therapy: Secondary | ICD-10-CM

## 2024-02-05 DIAGNOSIS — Z13220 Encounter for screening for lipoid disorders: Secondary | ICD-10-CM

## 2024-02-05 DIAGNOSIS — Z111 Encounter for screening for respiratory tuberculosis: Secondary | ICD-10-CM

## 2024-02-05 MED ORDER — ORENCIA CLICKJECT 125 MG/ML ~~LOC~~ SOAJ
125.0000 mg | SUBCUTANEOUS | 0 refills | Status: DC
  Filled 2024-02-05: qty 4, 28d supply, fill #0

## 2024-02-05 NOTE — Progress Notes (Signed)
 Specialty Pharmacy Refill Coordination Note  Dylan Dudley is a 63 y.o. male contacted today regarding refills of specialty medication(s) Abatacept  (Orencia  ClickJect)   Patient requested Delivery   Delivery date: 02/12/24   Verified address: [address] [place], [place] [address]   Medication will be filled on 02/11/24.  This fill date is pending response to refill request from provider. Patient is aware and if they have not received fill by intended date they must follow up with pharmacy.

## 2024-02-05 NOTE — Progress Notes (Signed)
 Labs were completed on 5/14 per [facility]. [facility] should send in 84 day supply for next refill. Next refill outreach retimed appropriately.

## 2024-02-05 NOTE — Telephone Encounter (Signed)
 Last Fill: 11/22/2023  Labs: 10/18/2023 RBC 4.11   Hgb 13.0   Hct 39.3   D Dimer (Quant) 3.61  Potassium 3.4  TB Gold: 12/28/22 Neg    Next Visit: 04/15/2024  Last Visit: 10/28/2023  DX: Rheumatoid arthritis with rheumatoid factor of multiple sites without organ or systems involvement   Current Dose per office note 10/28/2023: Orencia  125 mg sq injections once weekly   Patient advised he is due to update labs. Patient plans to update with his PCP tomorrow and will have lab sent to us . We will fax orders.   Okay to refill Orencia ?

## 2024-02-11 ENCOUNTER — Other Ambulatory Visit: Payer: Self-pay

## 2024-02-12 LAB — LAB REPORT - SCANNED: EGFR: 50

## 2024-02-18 ENCOUNTER — Telehealth: Payer: Self-pay | Admitting: *Deleted

## 2024-02-18 NOTE — Telephone Encounter (Signed)
 Please patient to discuss renal function with PCP.

## 2024-02-18 NOTE — Telephone Encounter (Signed)
 Labs received from:[facility]  Drawn on: 02/12/2024  Reviewed by: Jacinta Martinis, PA-C  Labs drawn: Uric Acid, CRP, Lipid Panel, CMP, CBC  Results: BUN 29   Creat. 1.6   GFR 50   Calc Osm 302   RBC 4.11   Hgb 13.3   Hct 38.9   Lymph Absolute 1.1   CRP 10.6   Uric Acid 7.9   Patient is on Orencia  125 mg SQ once weekly into skin once weekly.   Per Carolynne Citron: Is the patient under the care of Nephrology? Avoiding NSAIDS?  Spoke with patient. He states he is not under the care of a nephrologist. Patient states he is not taking any NSAIDS.

## 2024-02-18 NOTE — Telephone Encounter (Signed)
 Patient advised to discuss renal function with PCP. Patient expressed understanding.

## 2024-03-17 ENCOUNTER — Other Ambulatory Visit: Payer: Self-pay | Admitting: Rheumatology

## 2024-03-17 ENCOUNTER — Other Ambulatory Visit: Payer: Self-pay

## 2024-03-17 ENCOUNTER — Other Ambulatory Visit: Payer: Self-pay | Admitting: Pharmacy Technician

## 2024-03-17 DIAGNOSIS — Z79899 Other long term (current) drug therapy: Secondary | ICD-10-CM

## 2024-03-17 DIAGNOSIS — M0579 Rheumatoid arthritis with rheumatoid factor of multiple sites without organ or systems involvement: Secondary | ICD-10-CM

## 2024-03-17 MED ORDER — ORENCIA CLICKJECT 125 MG/ML ~~LOC~~ SOAJ
125.0000 mg | SUBCUTANEOUS | 0 refills | Status: DC
  Filled 2024-03-17: qty 12, 84d supply, fill #0

## 2024-03-17 NOTE — Progress Notes (Signed)
 Specialty Pharmacy Refill Coordination Note  Dylan Dudley is a 63 y.o. male contacted today regarding refills of specialty medication(s) Abatacept  (Orencia  ClickJect)   Patient requested Delivery   Delivery date: 03/20/24   Verified address: [address]  [place]   Medication will be filled on 03/17/24 or when medication comes in the order.

## 2024-03-17 NOTE — Telephone Encounter (Signed)
 Last Fill: 02/05/2024  Labs: 02/12/2024               BUN 29   Creat. 1.6   GFR 50   Calc Osm 302   RBC 4.11   Hgb 13.3   Hct 38.9   Lymph Absolute 1.1   CRP 10.6   Uric Acid 7.9  TB Gold: 02/12/2024 TB Gold Neg   Next Visit: 04/15/2024  Last Visit: 10/28/2023  DX: Rheumatoid arthritis with rheumatoid factor of multiple sites without organ or systems involvement   Current Dose per office note 10/28/2023: Orencia  125 mg sq injections once weekly   Okay to refill Orencia ?

## 2024-04-01 NOTE — Progress Notes (Signed)
 Office Visit Note  Patient: Dylan Dudley             Date of Birth: 1961/05/15           MRN: [id number]             PCP: Toy Laurance POUR, MD Referring: Toy Laurance POUR, MD Visit Date: 04/15/2024 Occupation: @GUAROCC @  Subjective:  Medication management  History of Present Illness: Dylan Dudley is a 63 y.o. male with seropositive rheumatoid arthritis and osteoarthritis.  He has not had any episodes of joint pain or joint swelling.  He notices some discomfort in his replaced knee joints with increased walking.  Denies discomfort in his feet, neck and lower back today.  He returns today after his last visit on October 28, 2023.  He is on Orencia  125 mg subcu weekly since February 2021.    Activities of Daily Living:  Patient reports morning stiffness for 0 minute.   Patient Denies nocturnal pain.  Difficulty dressing/grooming: Denies Difficulty climbing stairs: Reports Difficulty getting out of chair: Reports Difficulty using hands for taps, buttons, cutlery, and/or writing: Denies  Review of Systems  Constitutional:  Negative for fatigue.  HENT:  Negative for mouth sores and mouth dryness.   Eyes:  Negative for dryness.  Respiratory:  Negative for shortness of breath.   Cardiovascular:  Negative for chest pain and palpitations.  Gastrointestinal:  Negative for blood in stool, constipation and diarrhea.  Endocrine: Negative for increased urination.  Genitourinary:  Negative for involuntary urination.  Musculoskeletal:  Positive for joint pain, joint pain, joint swelling, myalgias and myalgias. Negative for gait problem, muscle weakness, morning stiffness and muscle tenderness.  Skin:  Negative for color change, rash, hair loss and sensitivity to sunlight.  Allergic/Immunologic: Negative for susceptible to infections.  Neurological:  Negative for dizziness and headaches.  Hematological:  Negative for swollen glands.  Psychiatric/Behavioral:  Positive for  depressed mood and sleep disturbance. The patient is nervous/anxious.     PMFS History:  Patient Active Problem List   Diagnosis Date Noted   S/P TKR (total knee replacement), left 12/13/2020   Osteoarthritis of left knee 11/28/2020   Rheumatoid arthritis with rheumatoid factor of multiple sites without organ or systems involvement (HCC) 09/10/2016   Primary osteoarthritis of both hands 09/10/2016   Primary osteoarthritis of both knees 09/10/2016   Primary osteoarthritis of both feet 09/10/2016   DJD (degenerative joint disease), cervical 09/10/2016   History of tremor 09/10/2016   History of sleep apnea 09/10/2016   Coronary artery disease involving native heart 09/10/2016   Smoker 09/10/2016   High risk medication use 09/10/2016   Tear of right rotator cuff 03/14/2012   Impingement syndrome of right shoulder 03/14/2012   AC (acromioclavicular) arthritis, right 03/14/2012   Disorder of tendon of biceps 03/14/2012    Past Medical History:  Diagnosis Date   AC (acromioclavicular) arthritis 03/14/2012   Anxiety    Arthritis    shoulders; knees (07/17/2013)   Chronic bronchitis (HCC)    Congestive heart failure (CHF) (HCC)    per patient    DDD (degenerative disc disease)    Depression    Disorder of tendon of biceps 03/14/2012   GERD (gastroesophageal reflux disease)    Hyperlipemia    Hypertension    Impingement syndrome of right shoulder 03/14/2012   Pneumonia 2012   once (07/17/2013)   Rheumatoid arthritis (HCC)    Seasonal allergies    Sleep apnea    CPAP  Tear of right rotator cuff 03/14/2012    Family History  Problem Relation Age of Onset   Breast cancer Mother    Hypertension Mother    Hypertension Father    Multiple sclerosis Sister    Past Surgical History:  Procedure Laterality Date   ANTERIOR CERVICAL DECOMP/DISCECTOMY FUSION  08/25/2011   [place]   BUNIONECTOMY Bilateral 09/24/1996   DEBRIDEMENT AND CLOSURE WOUND  12/23/2012   back of my  neck (07/17/2013)   DEBRIDEMENT AND CLOSURE WOUND N/A 07/17/2013   Procedure: DEBRIDEMENT  OF SKIN, SUBCUTANEOUS TISSUE BONE, MUSCLE FLAP, AND CLOSURE WOUND;  Surgeon: Earlis Ranks, MD;  Location: [facility];  Service: Plastics;  Laterality: N/A;   EXCISIONAL HEMORRHOIDECTOMY  ~ 1996   INCISION AND DRAINAGE OF WOUND  10/2012 X 2   back of my neck (07/17/2013)   MUSCLE FLAP CLOSURE  07/17/2013   to back of my neck (07/17/2013)   NECK SURGERY  09/24/2012   spur removed from back of my neck (07/17/2013)   REPLACEMENT TOTAL KNEE Right 01/19/2023   SHOULDER ARTHROSCOPY Left 09/24/2006   SHOULDER ARTHROSCOPY W/ ROTATOR CUFF REPAIR Right 09/25/2011   and bicep repair (07/17/2013)   TONSILLECTOMY  ~ 1974   TOTAL KNEE ARTHROPLASTY Left 12/13/2020   Procedure: TOTAL KNEE ARTHROPLASTY;  Surgeon: Josefina Chew, MD;  Location: [facility];  Service: Orthopedics;  Laterality: Left;   Social History   Social History Narrative   Not on file   Immunization History  Administered Date(s) Administered   Influenza,inj,Quad PF,6+ Mos 07/18/2013   Moderna Covid-19 Fall Seasonal Vaccine 35yrs & older 07/03/2022, 07/29/2023   Moderna Covid-19 Vaccine Bivalent Booster 65yrs & up 06/22/2021   Moderna Sars-Covid-2 Vaccination 10/31/2019, 11/28/2019, 07/22/2020   Pneumococcal Polysaccharide-23 07/18/2013   Tdap 11/28/2006, 04/30/2014     Objective: Vital Signs: BP 129/85 (BP Location: Left Arm, Patient Position: Sitting, Cuff Size: Large)   Pulse (!) 55   Resp 14   Ht 6' 2 (1.88 m)   Wt 256 lb (116.1 kg)   BMI 32.87 kg/m    Physical Exam Vitals and nursing note reviewed.  Constitutional:      Appearance: He is well-developed.  HENT:     Head: Normocephalic and atraumatic.  Eyes:     Conjunctiva/sclera: Conjunctivae normal.     Pupils: Pupils are equal, round, and reactive to light.  Cardiovascular:     Rate and Rhythm: Normal rate and regular rhythm.     Heart sounds: Normal heart sounds.   Pulmonary:     Effort: Pulmonary effort is normal.     Breath sounds: Normal breath sounds.  Abdominal:     General: Bowel sounds are normal.     Palpations: Abdomen is soft.  Musculoskeletal:     Cervical back: Normal range of motion and neck supple.  Skin:    General: Skin is warm and dry.     Capillary Refill: Capillary refill takes less than 2 seconds.  Neurological:     Mental Status: He is alert and oriented to person, place, and time.  Psychiatric:        Behavior: Behavior normal.      Musculoskeletal Exam: Cervical, thoracic and lumbar spine with good range of motion.  Shoulders, elbows, wrist joints, MCPs PIPs and DIPs with good range of motion with no synovitis.  Hip joints in good range of motion.  Right knee joint was replaced and warm to touch.  No effusion was noted.  Left knee joint was  in good range of motion without any warmth swelling or effusion.  There was no tenderness over ankles or MTPs.  CDAI Exam: CDAI Score: -- Patient Global: 0 / 100; Provider Global: 0 / 100 Swollen: --; Tender: -- Joint Exam 04/15/2024   No joint exam has been documented for this visit   There is currently no information documented on the homunculus. Go to the Rheumatology activity and complete the homunculus joint exam.  Investigation: No additional findings.  Imaging: No results found.  Recent Labs: Lab Results  Component Value Date   WBC 4.0 12/11/2021   HGB 13.5 12/11/2021   PLT 216 12/11/2021   NA 138 12/11/2021   K 4.2 12/11/2021   CL 102 12/11/2021   CO2 27 12/11/2021   GLUCOSE 104 (H) 12/11/2021   BUN 28 (H) 12/11/2021   CREATININE 1.77 (H) 12/11/2021   BILITOT 0.5 12/11/2021   ALKPHOS 68 01/10/2017   AST 19 12/11/2021   ALT 18 12/11/2021   PROT 6.9 12/11/2021   ALBUMIN 4.6 01/10/2017   CALCIUM  9.8 12/11/2021   GFRAA 79 12/17/2019   QFTBGOLDPLUS NEGATIVE 12/11/2021     Speciality Comments: PLQ Eye Exam: 11/19/2018 WNL @ [facility] Follow  up in 1 year  Procedures:  No procedures performed Allergies: Codeine, Daptomycin, Hydroxyzine, Latex, and Vancomycin   Assessment / Plan:     Visit Diagnoses: Rheumatoid arthritis with rheumatoid factor of multiple sites without organ or systems involvement (HCC)-he had no synovitis on the examination today.  He denies having a flare of rheumatoid arthritis.  He has been tolerating Orencia  without any side effects.  High risk medication use - Orencia  125 mg sq injections once weekly since February 2021.  Previous treatment :hydroxychloroquine , methotrexate  and leflunomide  -02/12/2024 CMP creatinine 1.6, GFR 50, AST 20, ALT 16, CRP 10.6, uric acid 7.9, lipid panel normal.  Plan: CBC with Differential/Platelet, Comprehensive metabolic panel with GFR, QuantiFERON-TB Gold Plus today.  Annual TB Gold was advised.  Labs will be done every 3 months.  Information regarding immunization was placed in the AVS.  He was advised to hold Orencia  if he develops an infection resume after the infection resolves.  Increased risk of skin cancer associated with Orencia  use was discussed.  Annual skin examination to screen for skin cancer was advised.  Use of sunscreen and sun protection was discussed.  Elevated serum creatinine-his creatinine has been elevated and his GFR has been low for the last several months.  I offered referral to nephrology for evaluation.  Patient stated that he will contact his PCP for a referral in [place].  Primary osteoarthritis of both hands-bilateral PIP and DIP thickening.  No synovitis was noted.  Status post total knee replacement, right - April 2024 by Dr. Evia in [place].  He has some warmth on palpation without any discomfort.  He notices some discomfort after prolonged walking.  S/P TKR (total knee replacement), left - March 2022 by Dr. Josefina.  He had good range of motion without discomfort.  He reports some discomfort on prolonged walking.  Primary osteoarthritis of both  feet-doing well without any synovitis.  He had bilateral bunionectomy.  DDD (degenerative disc disease), cervical-he had good range of motion without discomfort.  Chronic midline low back pain without sciatica-he denies discomfort or radiculopathy.  Other medical problems are listed as follows:  History of CHF (congestive heart failure)  History of coronary artery disease  Vitamin D  deficiency  History of depression  History of tremor  History of sleep apnea  Former smoker  Family history of multiple sclerosis-sister  Orders: Orders Placed This Encounter  Procedures   CBC with Differential/Platelet   Comprehensive metabolic panel with GFR   QuantiFERON-TB Gold Plus   No orders of the defined types were placed in this encounter.    Follow-Up Instructions: Return in about 5 months (around 09/15/2024) for Rheumatoid arthritis.   Maya Nash, MD  Note - This record has been created using Animal nutritionist.  Chart creation errors have been sought, but may not always  have been located. Such creation errors do not reflect on  the standard of medical care.

## 2024-04-15 ENCOUNTER — Encounter: Payer: Self-pay | Admitting: Rheumatology

## 2024-04-15 ENCOUNTER — Ambulatory Visit: Payer: Medicare Other | Attending: Rheumatology | Admitting: Rheumatology

## 2024-04-15 VITALS — BP 129/85 | HR 55 | Resp 14 | Ht 74.0 in | Wt 256.0 lb

## 2024-04-15 DIAGNOSIS — Z8669 Personal history of other diseases of the nervous system and sense organs: Secondary | ICD-10-CM

## 2024-04-15 DIAGNOSIS — M0579 Rheumatoid arthritis with rheumatoid factor of multiple sites without organ or systems involvement: Secondary | ICD-10-CM

## 2024-04-15 DIAGNOSIS — R7989 Other specified abnormal findings of blood chemistry: Secondary | ICD-10-CM | POA: Diagnosis not present

## 2024-04-15 DIAGNOSIS — M19042 Primary osteoarthritis, left hand: Secondary | ICD-10-CM

## 2024-04-15 DIAGNOSIS — M545 Low back pain, unspecified: Secondary | ICD-10-CM

## 2024-04-15 DIAGNOSIS — M19071 Primary osteoarthritis, right ankle and foot: Secondary | ICD-10-CM

## 2024-04-15 DIAGNOSIS — G8929 Other chronic pain: Secondary | ICD-10-CM

## 2024-04-15 DIAGNOSIS — M19041 Primary osteoarthritis, right hand: Secondary | ICD-10-CM

## 2024-04-15 DIAGNOSIS — M19072 Primary osteoarthritis, left ankle and foot: Secondary | ICD-10-CM

## 2024-04-15 DIAGNOSIS — Z96651 Presence of right artificial knee joint: Secondary | ICD-10-CM

## 2024-04-15 DIAGNOSIS — Z87891 Personal history of nicotine dependence: Secondary | ICD-10-CM

## 2024-04-15 DIAGNOSIS — Z8659 Personal history of other mental and behavioral disorders: Secondary | ICD-10-CM

## 2024-04-15 DIAGNOSIS — Z8679 Personal history of other diseases of the circulatory system: Secondary | ICD-10-CM

## 2024-04-15 DIAGNOSIS — E559 Vitamin D deficiency, unspecified: Secondary | ICD-10-CM

## 2024-04-15 DIAGNOSIS — M5030 Other cervical disc degeneration, unspecified cervical region: Secondary | ICD-10-CM

## 2024-04-15 DIAGNOSIS — Z820 Family history of epilepsy and other diseases of the nervous system: Secondary | ICD-10-CM

## 2024-04-15 DIAGNOSIS — Z79899 Other long term (current) drug therapy: Secondary | ICD-10-CM | POA: Diagnosis not present

## 2024-04-15 DIAGNOSIS — Z96652 Presence of left artificial knee joint: Secondary | ICD-10-CM

## 2024-04-15 NOTE — Patient Instructions (Signed)
 Standing Labs We placed an order today for your standing lab work.   Please have your standing labs drawn in October and every 3 months  Please have your labs drawn 2 weeks prior to your appointment so that the provider can discuss your lab results at your appointment, if possible.  Please note that you may see your imaging and lab results in MyChart before we have reviewed them. We will contact you once all results are reviewed. Please allow our office up to 72 hours to thoroughly review all of the results before contacting the office for clarification of your results.  WALK-IN LAB HOURS  Monday through Thursday from 8:00 am -12:30 pm and 1:00 pm-4:30 pm and Friday from 8:00 am-12:00 pm.  Patients with office visits requiring labs will be seen before walk-in labs.  You may encounter longer than normal wait times. Please allow additional time. Wait times may be shorter on  Monday and Thursday afternoons.  We do not book appointments for walk-in labs. We appreciate your patience and understanding with our staff.   Labs are drawn by [facility]. Please bring your co-pay at the time of your lab draw.  You may receive a bill from [facility] for your lab work.  Please note if you are on Hydroxychloroquine  and and an order has been placed for a Hydroxychloroquine  level,  you will need to have it drawn 4 hours or more after your last dose.  If you wish to have your labs drawn at another location, please call the office 24 hours in advance so we can fax the orders.  The office is located at [address], [place], [place] [address]   If you have any questions regarding directions or hours of operation,  please call [phone].   As a reminder, please drink plenty of water  prior to coming for your lab work. Thanks!   Vaccines You are taking a medication(s) that can suppress your immune system.  The following immunizations are recommended: Flu annually Covid-19  Td/Tdap (tetanus,  diphtheria, pertussis) every 10 years Pneumonia (Prevnar 15 then Pneumovax 23  at least 1 year apart.  Alternatively, can take Prevnar 20 without needing additional dose) Shingrix: 2 doses from 4 weeks to 6 months apart  Please check with your PCP to make sure you are up to date.   If you have signs or symptoms of an infection or start antibiotics: First, call your PCP for workup of your infection. Hold your medication through the infection, until you complete your antibiotics, and until symptoms resolve if you take the following: Injectable medication (Actemra, Benlysta, Cimzia, Cosentyx, Enbrel, Humira, Kevzara, Orencia , Remicade, Simponi, Stelara, Taltz, Tremfya) Methotrexate  Leflunomide  (Arava ) Mycophenolate (Cellcept) Xeljanz, Olumiant, or Rinvoq  Please get an annual skin examination to screen for skin cancer while you are on Orencia .  Please use sunscreen and sun protection

## 2024-04-16 ENCOUNTER — Ambulatory Visit: Payer: Self-pay | Admitting: Rheumatology

## 2024-04-16 NOTE — Progress Notes (Signed)
 CBC normal, CMP shows elevated creatinine which is stable.  Please forward results to his PCP.

## 2024-04-18 LAB — COMPREHENSIVE METABOLIC PANEL WITH GFR
AG Ratio: 1.5 (calc) (ref 1.0–2.5)
ALT: 15 U/L (ref 9–46)
AST: 20 U/L (ref 10–35)
Albumin: 4.1 g/dL (ref 3.6–5.1)
Alkaline phosphatase (APISO): 50 U/L (ref 35–144)
BUN/Creatinine Ratio: 18 (calc) (ref 6–22)
BUN: 29 mg/dL — ABNORMAL HIGH (ref 7–25)
CO2: 30 mmol/L (ref 20–32)
Calcium: 9.6 mg/dL (ref 8.6–10.3)
Chloride: 101 mmol/L (ref 98–110)
Creat: 1.57 mg/dL — ABNORMAL HIGH (ref 0.70–1.35)
Globulin: 2.8 g/dL (ref 1.9–3.7)
Glucose, Bld: 106 mg/dL — ABNORMAL HIGH (ref 65–99)
Potassium: 4.2 mmol/L (ref 3.5–5.3)
Sodium: 140 mmol/L (ref 135–146)
Total Bilirubin: 0.4 mg/dL (ref 0.2–1.2)
Total Protein: 6.9 g/dL (ref 6.1–8.1)
eGFR: 50 mL/min/1.73m2 — ABNORMAL LOW (ref >=60)

## 2024-04-18 LAB — CBC WITH DIFFERENTIAL/PLATELET
Absolute Lymphocytes: 1005 {cells}/uL (ref 850–3900)
Absolute Monocytes: 385 {cells}/uL (ref 200–950)
Basophils Absolute: 41 {cells}/uL (ref 0–200)
Basophils Relative: 1 %
Eosinophils Absolute: 148 {cells}/uL (ref 15–500)
Eosinophils Relative: 3.6 %
HCT: 42.8 % (ref 38.5–50.0)
Hemoglobin: 13.6 g/dL (ref 13.2–17.1)
MCH: 31.6 pg (ref 27.0–33.0)
MCHC: 31.8 g/dL — ABNORMAL LOW (ref 32.0–36.0)
MCV: 99.3 fL (ref 80.0–100.0)
MPV: 10.2 fL (ref 7.5–12.5)
Monocytes Relative: 9.4 %
Neutro Abs: 2522 {cells}/uL (ref 1500–7800)
Neutrophils Relative %: 61.5 %
Platelets: 225 Thousand/uL (ref 140–400)
RBC: 4.31 Million/uL (ref 4.20–5.80)
RDW: 13.3 % (ref 11.0–15.0)
Total Lymphocyte: 24.5 %
WBC: 4.1 Thousand/uL (ref 3.8–10.8)

## 2024-04-18 LAB — QUANTIFERON-TB GOLD PLUS
Mitogen-NIL: 9.93 [IU]/mL
NIL: 0.01 [IU]/mL
QuantiFERON-TB Gold Plus: NEGATIVE
TB1-NIL: 0 [IU]/mL
TB2-NIL: 0 [IU]/mL

## 2024-04-18 NOTE — Progress Notes (Signed)
 CBC normal, CMP is elevated and stable, TB Gold negative.  Please forward results to his PCP.

## 2024-06-05 ENCOUNTER — Other Ambulatory Visit: Payer: Self-pay

## 2024-06-05 ENCOUNTER — Other Ambulatory Visit: Payer: Self-pay | Admitting: Physician Assistant

## 2024-06-05 DIAGNOSIS — Z79899 Other long term (current) drug therapy: Secondary | ICD-10-CM

## 2024-06-05 DIAGNOSIS — M0579 Rheumatoid arthritis with rheumatoid factor of multiple sites without organ or systems involvement: Secondary | ICD-10-CM

## 2024-06-05 MED ORDER — ORENCIA CLICKJECT 125 MG/ML ~~LOC~~ SOAJ
125.0000 mg | SUBCUTANEOUS | 0 refills | Status: DC
  Filled 2024-06-05: qty 12, 84d supply, fill #0

## 2024-06-05 NOTE — Progress Notes (Signed)
 Specialty Pharmacy Refill Coordination Note  Dylan Dudley is a 63 y.o. male contacted today regarding refills of specialty medication(s) Abatacept  (Orencia  [employer]ect)   Patient requested Delivery   Delivery date: 06/10/24   Verified address: [address]  [place]   Medication will be filled on 09.16.25.   This fill date is pending response to refill request from provider. Patient is aware and if they have not received fill by intended date they must follow up with pharmacy.

## 2024-06-05 NOTE — Telephone Encounter (Signed)
 Last Fill: 03/17/2024  Labs: 04/15/2024 CBC normal, CMP is elevated and stable   TB Gold: 04/15/2024 TB Gold negative   Next Visit: 09/10/2024  Last Visit: 04/15/2024  IK:Myzlfjunpi arthritis with rheumatoid factor of multiple sites without organ or systems involvement (HCC)   Current Dose per office note 04/15/2024: Orencia  125 mg sq injections   Okay to refill Orencia ?

## 2024-06-09 ENCOUNTER — Other Ambulatory Visit: Payer: Self-pay

## 2024-08-11 LAB — LAB REPORT - SCANNED
EGFR: 45
PSA, Total: 0.33
TSH: 0.99 (ref 0.41–5.90)

## 2024-08-25 ENCOUNTER — Other Ambulatory Visit: Payer: Self-pay | Admitting: Rheumatology

## 2024-08-25 ENCOUNTER — Other Ambulatory Visit (HOSPITAL_COMMUNITY): Payer: Self-pay

## 2024-08-25 ENCOUNTER — Other Ambulatory Visit: Payer: Self-pay

## 2024-08-25 DIAGNOSIS — M0579 Rheumatoid arthritis with rheumatoid factor of multiple sites without organ or systems involvement: Secondary | ICD-10-CM

## 2024-08-25 DIAGNOSIS — Z79899 Other long term (current) drug therapy: Secondary | ICD-10-CM

## 2024-08-25 MED ORDER — ORENCIA CLICKJECT 125 MG/ML ~~LOC~~ SOAJ
125.0000 mg | SUBCUTANEOUS | 0 refills | Status: DC
  Filled 2024-08-25 – 2024-08-27 (×2): qty 4, 28d supply, fill #0

## 2024-08-25 NOTE — Telephone Encounter (Signed)
 Last Fill: 03/17/2024   Labs: 04/15/2024 CBC normal, CMP is elevated and stable    TB Gold: 04/15/2024 TB Gold negative    Next Visit: 09/10/2024   Last Visit: 04/15/2024   IK:Myzlfjunpi arthritis with rheumatoid factor of multiple sites without organ or systems involvement   Current Dose per office note 04/15/2024: Orencia  125 mg sq injections   Left message to advise patient he is due to update labs.    Okay to refill Orencia ?

## 2024-08-26 ENCOUNTER — Other Ambulatory Visit: Payer: Self-pay

## 2024-08-27 ENCOUNTER — Other Ambulatory Visit: Payer: Self-pay

## 2024-08-27 NOTE — Progress Notes (Signed)
 Specialty Pharmacy Refill Coordination Note  Dylan Dudley is a 63 y.o. male contacted today regarding refills of specialty medication(s) Abatacept  (Orencia  [employer]Ject)   Patient requested Delivery   Delivery date: 09/03/24   Verified address: [address]  [place]   Medication will be filled on: 09/02/24

## 2024-08-27 NOTE — Progress Notes (Unsigned)
 Office Visit Note  Patient: Dylan Dudley             Date of Birth: 03-13-1961           MRN: [id number]             PCP: Toy Laurance POUR, MD Referring: Toy Laurance POUR, MD Visit Date: 09/10/2024 Occupation: Data Unavailable  Subjective:  Medication monitoring   History of Present Illness: TAIJUAN SERVISS is a 63 y.o. male with history of seropositive rheumatoid arthritis.  Patient remains on Orencia  125 mg sq injections once weekly since February 2021.  He is tolerating monthly without any side effects and has not had any gaps in therapy.  He denies any signs or symptoms of a rheumatoid arthritis flare.  His morning stiffness has been lasting 10 to 15 minutes daily.  He has not had any nocturnal pain or difficulty performing ADLs. He denies any new medical conditions.  He denies any recent or recurrent infections.    Activities of Daily Living:  Patient reports morning stiffness for 10-15 minutes.   Patient Denies nocturnal pain.  Difficulty dressing/grooming: Denies Difficulty climbing stairs: Denies Difficulty getting out of chair: Reports Difficulty using hands for taps, buttons, cutlery, and/or writing: Denies  Review of Systems  Constitutional:  Positive for fatigue.  HENT:  Negative for mouth sores and mouth dryness.   Eyes:  Negative for dryness.  Respiratory:  Negative for shortness of breath.   Cardiovascular:  Negative for chest pain and palpitations.  Gastrointestinal:  Negative for blood in stool, constipation and diarrhea.  Endocrine: Negative for increased urination.  Genitourinary:  Negative for involuntary urination.  Musculoskeletal:  Positive for joint pain, joint pain, myalgias, morning stiffness and myalgias. Negative for gait problem, joint swelling, muscle weakness and muscle tenderness.  Skin:  Negative for color change, rash, hair loss and sensitivity to sunlight.  Allergic/Immunologic: Negative for susceptible to infections.   Neurological:  Negative for dizziness and headaches.  Hematological:  Negative for swollen glands.  Psychiatric/Behavioral:  Positive for depressed mood and sleep disturbance. The patient is not nervous/anxious.     PMFS History:  Patient Active Problem List   Diagnosis Date Noted   S/P TKR (total knee replacement), left 12/13/2020   Osteoarthritis of left knee 11/28/2020   Rheumatoid arthritis with rheumatoid factor of multiple sites without organ or systems involvement (HCC) 09/10/2016   Primary osteoarthritis of both hands 09/10/2016   Primary osteoarthritis of both knees 09/10/2016   Primary osteoarthritis of both feet 09/10/2016   DJD (degenerative joint disease), cervical 09/10/2016   History of tremor 09/10/2016   History of sleep apnea 09/10/2016   Coronary artery disease involving native heart 09/10/2016   Smoker 09/10/2016   High risk medication use 09/10/2016   Tear of right rotator cuff 03/14/2012   Impingement syndrome of right shoulder 03/14/2012   AC (acromioclavicular) arthritis, right 03/14/2012   Disorder of tendon of biceps 03/14/2012    Past Medical History:  Diagnosis Date   AC (acromioclavicular) arthritis 03/14/2012   Anxiety    Arthritis    shoulders; knees (07/17/2013)   Chronic bronchitis (HCC)    Congestive heart failure (CHF) (HCC)    per patient    DDD (degenerative disc disease)    Depression    Disorder of tendon of biceps 03/14/2012   GERD (gastroesophageal reflux disease)    Hyperlipemia    Hypertension    Impingement syndrome of right shoulder 03/14/2012   Pneumonia 2012  once (07/17/2013)   Rheumatoid arthritis (HCC)    Seasonal allergies    Sleep apnea    CPAP    Tear of right rotator cuff 03/14/2012    Family History  Problem Relation Age of Onset   Breast cancer Mother    Hypertension Mother    Hypertension Father    Multiple sclerosis Sister    Past Surgical History:  Procedure Laterality Date   ANTERIOR CERVICAL  DECOMP/DISCECTOMY FUSION  08/25/2011   [place]   BUNIONECTOMY Bilateral 09/24/1996   CATARACT EXTRACTION Bilateral    DEBRIDEMENT AND CLOSURE WOUND  12/23/2012   back of my neck (07/17/2013)   DEBRIDEMENT AND CLOSURE WOUND N/A 07/17/2013   Procedure: DEBRIDEMENT  OF SKIN, SUBCUTANEOUS TISSUE BONE, MUSCLE FLAP, AND CLOSURE WOUND;  Surgeon: Earlis Ranks, MD;  Location: [facility] OR;  Service: Plastics;  Laterality: N/A;   EXCISIONAL HEMORRHOIDECTOMY  ~ 1996   INCISION AND DRAINAGE OF WOUND  10/2012 X 2   back of my neck (07/17/2013)   MUSCLE FLAP CLOSURE  07/17/2013   to back of my neck (07/17/2013)   NECK SURGERY  09/24/2012   spur removed from back of my neck (07/17/2013)   REPLACEMENT TOTAL KNEE Right 01/19/2023   SHOULDER ARTHROSCOPY Left 09/24/2006   SHOULDER ARTHROSCOPY W/ ROTATOR CUFF REPAIR Right 09/25/2011   and bicep repair (07/17/2013)   TONSILLECTOMY  ~ 1974   TOTAL KNEE ARTHROPLASTY Left 12/13/2020   Procedure: TOTAL KNEE ARTHROPLASTY;  Surgeon: Josefina Chew, MD;  Location: [facility];  Service: Orthopedics;  Laterality: Left;   Social History   Tobacco Use   Smoking status: Former    Current packs/day: 0.00    Average packs/day: 0.1 packs/day for 40.0 years (4.0 ttl pk-yrs)    Types: Cigarettes    Start date: 07/1980    Quit date: 07/2020    Years since quitting: 4.1    Passive exposure: Current   Smokeless tobacco: Never  Vaping Use   Vaping status: Never Used  Substance Use Topics   Alcohol use: Yes    Comment: occ   Drug use: No   Social History   Social History Narrative   Not on file     Immunization History  Administered Date(s) Administered   Influenza,inj,Quad PF,6+ Mos 07/18/2013   Moderna Covid-19 Fall Seasonal Vaccine 47yrs & older 07/03/2022, 07/29/2023   Moderna Covid-19 Vaccine Bivalent Booster 6yrs & up 06/22/2021   Moderna Sars-Covid-2 Vaccination 10/31/2019, 11/28/2019, 07/22/2020   Pneumococcal Polysaccharide-23 07/18/2013    Tdap 11/28/2006, 04/30/2014     Objective: Vital Signs: BP 118/79   Pulse (!) 56   Temp 97.7 F (36.5 C)   Resp 14   Ht 6' 2 (1.88 m)   Wt 259 lb 9.6 oz (117.8 kg)   BMI 33.33 kg/m    Physical Exam Vitals and nursing note reviewed.  Constitutional:      Appearance: He is well-developed.  HENT:     Head: Normocephalic and atraumatic.  Eyes:     Conjunctiva/sclera: Conjunctivae normal.     Pupils: Pupils are equal, round, and reactive to light.  Cardiovascular:     Rate and Rhythm: Normal rate and regular rhythm.     Heart sounds: Normal heart sounds.  Pulmonary:     Effort: Pulmonary effort is normal.     Breath sounds: Normal breath sounds.  Abdominal:     General: Bowel sounds are normal.     Palpations: Abdomen is soft.  Musculoskeletal:  Cervical back: Normal range of motion and neck supple.  Skin:    General: Skin is warm and dry.     Capillary Refill: Capillary refill takes less than 2 seconds.  Neurological:     Mental Status: He is alert and oriented to person, place, and time.  Psychiatric:        Behavior: Behavior normal.      Musculoskeletal Exam: C-spine has slightly limited range of motion without rotation.  Shoulder joints have good range of motion with no discomfort today.  No tenderness on elbow joint line.  Wrist joints are good range of motion no tenderness or synovitis.  No tenderness or synovitis of MCP joints.  Hip joints have good range of motion.  Both knee replacements have good range of motion with no effusion.  No tenderness or swelling of ankle joints.  CDAI Exam: CDAI Score: -- Patient Global: --; Provider Global: -- Swollen: --; Tender: -- Joint Exam 09/10/2024   No joint exam has been documented for this visit   There is currently no information documented on the homunculus. Go to the Rheumatology activity and complete the homunculus joint exam.  Investigation: No additional findings.  Imaging: No results  found.  Recent Labs: Lab Results  Component Value Date   WBC 4.1 04/15/2024   HGB 13.6 04/15/2024   PLT 225 04/15/2024   NA 140 04/15/2024   K 4.2 04/15/2024   CL 101 04/15/2024   CO2 30 04/15/2024   GLUCOSE 106 (H) 04/15/2024   BUN 29 (H) 04/15/2024   CREATININE 1.57 (H) 04/15/2024   BILITOT 0.4 04/15/2024   ALKPHOS 68 01/10/2017   AST 20 04/15/2024   ALT 15 04/15/2024   PROT 6.9 04/15/2024   ALBUMIN 4.6 01/10/2017   CALCIUM  9.6 04/15/2024   GFRAA 79 12/17/2019   QFTBGOLDPLUS NEGATIVE 04/15/2024    Speciality Comments: PLQ Eye Exam: 11/19/2018 WNL @ [facility] Follow up in 1 year  Procedures:  No procedures performed Allergies: Codeine, Daptomycin, Hydroxyzine, Latex, and Vancomycin   Assessment / Plan:     Visit Diagnoses: Rheumatoid arthritis with rheumatoid factor of multiple sites without organ or systems involvement (HCC) - Orencia  125 mg sq injections once weekly since February 2021.  Previous treatment :hydroxychloroquine , methotrexate  and leflunomide : He has no synovitis on examination today.  He has not had any signs or symptoms of a rheumatoid arthritis flare.  He is clinically doing well on Orencia  125 mg subcutaneous injections once weekly.  He has not had any side effects or injection site reactions.  No recent or recurrent infections.  No new medical conditions.  Patient will remain on Orencia  125 mg sq injections once weekly.  He was advised to notify us  if he develop signs or symptoms of a flare.  He will follow-up in the office in 5 months or sooner if needed.  High risk medication use - Orencia  125 mg sq injections once weekly since February 2021.  Previous treatment :hydroxychloroquine , methotrexate  and leflunomide  Lab work from 08/26/2024 was reviewed today in the office: Creatinine 1.70, GFR 45, AST 26, ALT 24, total cholesterol 127, triglyceride 65, HDL 63.2, LDL 51, TSH 0.989, vitamin D  50.3, hemoglobin A1c was 5.6%, white blood cell count 3.80,  red blood cell count 4.33, hemoglobin 13.8, hematocrit 40.1, platelet count 217K.  He will be having updated lab work in 3 months and will have results forwarded to our office to review.  TB gold negative on 04/15/24.   No recent  or recurrent infections. Discussed the importance of holding orencia  if he develops signs or symptoms of an infection and to resume once the infection has completely cleared.   Elevated serum creatinine: Creatinine was 1.70 and GFR was 45 on 08/26/2024.  Patient will be having updated lab work in 3 months.  He avoids use of NSAIDs.  Primary osteoarthritis of both hands: He has PIP and DIP thickening consistent with osteoarthritis of both hands.  No synovitis noted.  Complete fist formation bilaterally.  Discussed importance of joint protection and muscle strengthening.  Status post total knee replacement, right - April 2024 by Dr. Evia in [place]. Doing well. No effusion.   S/P TKR (total knee replacement), left - March 2022 by Dr. Josefina.Doing well.  No effusion.    Primary osteoarthritis of both feet: He has good ROM with no tenderness or joint swelling.    DDD (degenerative disc disease), cervical: C-spine has limited ROM with lateral rotation. No symptoms of radiculopathy.    Other medical conditions are listed as follows:   History of CHF (congestive heart failure)  History of coronary artery disease  Vitamin D  deficiency  History of depression  History of tremor  History of sleep apnea  Chronic midline low back pain without sciatica  Former smoker  Family history of multiple sclerosis-sister  Orders: No orders of the defined types were placed in this encounter.  No orders of the defined types were placed in this encounter.    Follow-Up Instructions: Return in about 5 months (around 02/08/2025) for Rheumatoid arthritis.   Waddell CHRISTELLA Craze, PA-C  Note - This record has been created using Dragon software.  Chart creation errors have been  sought, but may not always  have been located. Such creation errors do not reflect on  the standard of medical care.

## 2024-09-02 ENCOUNTER — Other Ambulatory Visit: Payer: Self-pay

## 2024-09-10 ENCOUNTER — Encounter: Payer: Self-pay | Admitting: Physician Assistant

## 2024-09-10 ENCOUNTER — Ambulatory Visit: Attending: Physician Assistant | Admitting: Physician Assistant

## 2024-09-10 ENCOUNTER — Telehealth: Payer: Self-pay

## 2024-09-10 VITALS — BP 118/79 | HR 56 | Temp 97.7°F | Resp 14 | Ht 74.0 in | Wt 259.6 lb

## 2024-09-10 DIAGNOSIS — M5030 Other cervical disc degeneration, unspecified cervical region: Secondary | ICD-10-CM

## 2024-09-10 DIAGNOSIS — M19072 Primary osteoarthritis, left ankle and foot: Secondary | ICD-10-CM

## 2024-09-10 DIAGNOSIS — R7989 Other specified abnormal findings of blood chemistry: Secondary | ICD-10-CM

## 2024-09-10 DIAGNOSIS — M0579 Rheumatoid arthritis with rheumatoid factor of multiple sites without organ or systems involvement: Secondary | ICD-10-CM

## 2024-09-10 DIAGNOSIS — Z8669 Personal history of other diseases of the nervous system and sense organs: Secondary | ICD-10-CM

## 2024-09-10 DIAGNOSIS — Z8679 Personal history of other diseases of the circulatory system: Secondary | ICD-10-CM

## 2024-09-10 DIAGNOSIS — E559 Vitamin D deficiency, unspecified: Secondary | ICD-10-CM

## 2024-09-10 DIAGNOSIS — Z96651 Presence of right artificial knee joint: Secondary | ICD-10-CM

## 2024-09-10 DIAGNOSIS — G8929 Other chronic pain: Secondary | ICD-10-CM

## 2024-09-10 DIAGNOSIS — M19071 Primary osteoarthritis, right ankle and foot: Secondary | ICD-10-CM | POA: Diagnosis not present

## 2024-09-10 DIAGNOSIS — Z8659 Personal history of other mental and behavioral disorders: Secondary | ICD-10-CM

## 2024-09-10 DIAGNOSIS — Z820 Family history of epilepsy and other diseases of the nervous system: Secondary | ICD-10-CM

## 2024-09-10 DIAGNOSIS — Z87891 Personal history of nicotine dependence: Secondary | ICD-10-CM

## 2024-09-10 DIAGNOSIS — M19041 Primary osteoarthritis, right hand: Secondary | ICD-10-CM

## 2024-09-10 DIAGNOSIS — Z79899 Other long term (current) drug therapy: Secondary | ICD-10-CM | POA: Diagnosis not present

## 2024-09-10 DIAGNOSIS — M19042 Primary osteoarthritis, left hand: Secondary | ICD-10-CM

## 2024-09-10 DIAGNOSIS — Z96652 Presence of left artificial knee joint: Secondary | ICD-10-CM | POA: Diagnosis not present

## 2024-09-10 DIAGNOSIS — M545 Low back pain, unspecified: Secondary | ICD-10-CM

## 2024-09-10 NOTE — Telephone Encounter (Signed)
 Labs received from: [facility]  Drawn on: 08/11/2024  Reviewed by: Waddell Craze, PA-C  Labs drawn: Prostate Antigen, CMP, CBC, Hemoglobin A1c  Results:  Urea Nitrogen 24 Creatinine 1.70 eGFR 45 RBC 4.33 HGB 13.8 HCT 40.1 Lymph% 23.4 Mono% 11.1 Eo% 3.7 Baso% 1.3 Lymph# 0.89

## 2024-09-22 ENCOUNTER — Other Ambulatory Visit (HOSPITAL_COMMUNITY): Payer: Self-pay

## 2024-09-22 ENCOUNTER — Other Ambulatory Visit: Payer: Self-pay

## 2024-09-22 ENCOUNTER — Other Ambulatory Visit: Payer: Self-pay | Admitting: Rheumatology

## 2024-09-22 DIAGNOSIS — M0579 Rheumatoid arthritis with rheumatoid factor of multiple sites without organ or systems involvement: Secondary | ICD-10-CM

## 2024-09-22 DIAGNOSIS — Z79899 Other long term (current) drug therapy: Secondary | ICD-10-CM

## 2024-09-25 ENCOUNTER — Other Ambulatory Visit: Payer: Self-pay

## 2024-09-25 ENCOUNTER — Other Ambulatory Visit: Payer: Self-pay | Admitting: Rheumatology

## 2024-09-25 DIAGNOSIS — Z79899 Other long term (current) drug therapy: Secondary | ICD-10-CM

## 2024-09-25 DIAGNOSIS — M0579 Rheumatoid arthritis with rheumatoid factor of multiple sites without organ or systems involvement: Secondary | ICD-10-CM

## 2024-09-25 NOTE — Telephone Encounter (Signed)
 Last Fill: 08/25/2024  Labs: 08/11/2024 Labs drawn: Prostate Antigen, CMP, CBC, Hemoglobin A1c   Results:  Urea Nitrogen 24 Creatinine 1.70 eGFR 45 RBC 4.33 HGB 13.8 HCT 40.1 Lymph% 23.4 Mono% 11.1 Eo% 3.7 Baso% 1.3 Lymph# 0.89   TB Gold: 04/15/2024 Neg.   Next Visit: 02/10/2025  Last Visit: 09/10/2024  IK:Myzlfjunpi arthritis with rheumatoid factor of multiple sites without organ or systems involvement   Current Dose per office note 09/10/2024: Orencia  125 mg sq injections once weekly   Okay to refill Orencia ?

## 2024-09-27 MED ORDER — ORENCIA CLICKJECT 125 MG/ML ~~LOC~~ SOAJ
125.0000 mg | SUBCUTANEOUS | 0 refills | Status: AC
  Filled 2024-09-28: qty 12, 84d supply, fill #0
  Filled 2024-09-28: qty 4, 28d supply, fill #0
  Filled 2024-10-21: qty 4, 28d supply, fill #1

## 2024-09-28 ENCOUNTER — Other Ambulatory Visit: Payer: Self-pay

## 2024-09-28 ENCOUNTER — Other Ambulatory Visit (HOSPITAL_COMMUNITY): Payer: Self-pay

## 2024-09-28 NOTE — Progress Notes (Signed)
 Specialty Pharmacy Refill Coordination Note  Dylan Dudley is a 64 y.o. male contacted today regarding refills of specialty medication(s) Abatacept  (Orencia  [employer])   Patient requested Delivery   Delivery date: 10/01/24   Verified address: [address]  [place]   Medication will be filled on: 09/30/24    Pt okay with $70 copay

## 2024-09-30 ENCOUNTER — Other Ambulatory Visit: Payer: Self-pay

## 2024-10-21 ENCOUNTER — Other Ambulatory Visit: Payer: Self-pay

## 2024-10-21 NOTE — Progress Notes (Signed)
 Specialty Pharmacy Refill Coordination Note  Dylan Dudley is a 64 y.o. male contacted today regarding refills of specialty medication(s) Abatacept  (Orencia  [employer])   Patient requested Delivery   Delivery date: 10/30/24   Verified address: [address]  [place]   Medication will be filled on: 10/29/24

## 2024-10-29 ENCOUNTER — Other Ambulatory Visit: Payer: Self-pay

## 2025-02-10 ENCOUNTER — Ambulatory Visit: Admitting: Physician Assistant
